# Patient Record
Sex: Male | Born: 1940 | Race: White | Hispanic: No | Marital: Married | Smoking: Never smoker
Health system: Southern US, Community
[De-identification: ages and names within clinical notes are randomized; demographics above are authoritative.]

## PROBLEM LIST (undated history)

## (undated) DIAGNOSIS — I314 Cardiac tamponade: Secondary | ICD-10-CM

## (undated) DIAGNOSIS — I4891 Unspecified atrial fibrillation: Secondary | ICD-10-CM

## (undated) DIAGNOSIS — I10 Essential (primary) hypertension: Secondary | ICD-10-CM

## (undated) DIAGNOSIS — I639 Cerebral infarction, unspecified: Secondary | ICD-10-CM

## (undated) DIAGNOSIS — I729 Aneurysm of unspecified site: Secondary | ICD-10-CM

## (undated) DIAGNOSIS — E119 Type 2 diabetes mellitus without complications: Secondary | ICD-10-CM

## (undated) HISTORY — DX: Cardiac tamponade: I31.4

## (undated) HISTORY — DX: Essential (primary) hypertension: I10

## (undated) HISTORY — DX: Type 2 diabetes mellitus without complications: E11.9

## (undated) HISTORY — DX: Unspecified atrial fibrillation: I48.91

## (undated) HISTORY — DX: Cerebral infarction, unspecified: I63.9

## (undated) HISTORY — PX: OTHER SURGICAL HISTORY: SHX169

---

## 2016-11-13 DIAGNOSIS — Z86010 Personal history of colonic polyps: Secondary | ICD-10-CM | POA: Insufficient documentation

## 2018-12-13 DIAGNOSIS — E785 Hyperlipidemia, unspecified: Secondary | ICD-10-CM | POA: Insufficient documentation

## 2018-12-13 DIAGNOSIS — I350 Nonrheumatic aortic (valve) stenosis: Secondary | ICD-10-CM | POA: Insufficient documentation

## 2018-12-13 DIAGNOSIS — I10 Essential (primary) hypertension: Secondary | ICD-10-CM | POA: Insufficient documentation

## 2018-12-13 DIAGNOSIS — R002 Palpitations: Secondary | ICD-10-CM | POA: Insufficient documentation

## 2018-12-13 DIAGNOSIS — E119 Type 2 diabetes mellitus without complications: Secondary | ICD-10-CM | POA: Insufficient documentation

## 2019-04-13 DIAGNOSIS — Z7189 Other specified counseling: Secondary | ICD-10-CM | POA: Insufficient documentation

## 2019-07-13 DIAGNOSIS — Z7901 Long term (current) use of anticoagulants: Secondary | ICD-10-CM | POA: Insufficient documentation

## 2019-07-13 DIAGNOSIS — I480 Paroxysmal atrial fibrillation: Secondary | ICD-10-CM | POA: Insufficient documentation

## 2020-07-23 DIAGNOSIS — H02831 Dermatochalasis of right upper eyelid: Secondary | ICD-10-CM | POA: Insufficient documentation

## 2020-07-23 DIAGNOSIS — H25813 Combined forms of age-related cataract, bilateral: Secondary | ICD-10-CM | POA: Insufficient documentation

## 2020-07-23 DIAGNOSIS — H43813 Vitreous degeneration, bilateral: Secondary | ICD-10-CM | POA: Insufficient documentation

## 2020-07-23 DIAGNOSIS — H524 Presbyopia: Secondary | ICD-10-CM | POA: Insufficient documentation

## 2020-07-23 DIAGNOSIS — H5213 Myopia, bilateral: Secondary | ICD-10-CM | POA: Insufficient documentation

## 2020-08-27 ENCOUNTER — Other Ambulatory Visit: Payer: Self-pay

## 2020-08-27 DIAGNOSIS — Z20822 Contact with and (suspected) exposure to covid-19: Secondary | ICD-10-CM

## 2020-08-29 LAB — NOVEL CORONAVIRUS, NAA: SARS-CoV-2, NAA: DETECTED — AB

## 2020-08-29 LAB — SARS-COV-2, NAA 2 DAY TAT

## 2020-08-30 ENCOUNTER — Telehealth: Payer: Self-pay | Admitting: Nurse Practitioner

## 2020-08-30 NOTE — Telephone Encounter (Signed)
Called to Discuss with patient about Covid symptoms and the use of the monoclonal antibody infusion for those with mild to moderate Covid symptoms and at a high risk of hospitalization.     Pt appears to qualify for this infusion due to co-morbid conditions and/or a member of an at-risk group in accordance with the FDA Emergency Use Authorization.   Patient would like to think about options. He has our contact information and will call if he decides to move forward.   Symptom onset: 08/26/20 Vaccinated: Yes, w/booster Qualified for Infusion: Yes, bmi, htn, cad  Alda Lea, NP [facility]  [phone]

## 2020-09-02 ENCOUNTER — Telehealth (HOSPITAL_COMMUNITY): Payer: Self-pay

## 2020-09-02 NOTE — Telephone Encounter (Signed)
Patient left a VM on hotline stating that he "took a turn for the worst" and is now interested in treatment. Called to Discuss with patient about Covid symptoms and the use of the monoclonal antibody infusion for those with mild to moderate Covid symptoms and at a high risk of hospitalization.     Pt no longer qualifies for this infusion, sx started on 1/24.    RN provided resources for [facility] as well as the [place] [facility] and [employer].

## 2020-09-04 ENCOUNTER — Ambulatory Visit (INDEPENDENT_AMBULATORY_CARE_PROVIDER_SITE_OTHER): Payer: Medicare HMO

## 2020-09-04 ENCOUNTER — Ambulatory Visit: Payer: Self-pay

## 2020-09-04 ENCOUNTER — Ambulatory Visit
Admission: EM | Admit: 2020-09-04 | Discharge: 2020-09-04 | Disposition: A | Discharge status: Home / Self Care | Payer: Medicare HMO | Attending: Emergency Medicine | Admitting: Emergency Medicine

## 2020-09-04 ENCOUNTER — Other Ambulatory Visit: Payer: Self-pay

## 2020-09-04 DIAGNOSIS — R059 Cough, unspecified: Secondary | ICD-10-CM | POA: Diagnosis not present

## 2020-09-04 DIAGNOSIS — U071 COVID-19: Secondary | ICD-10-CM | POA: Diagnosis not present

## 2020-09-04 DIAGNOSIS — R509 Fever, unspecified: Secondary | ICD-10-CM | POA: Diagnosis not present

## 2020-09-04 MED ORDER — BENZONATATE 200 MG PO CAPS: 200.0000 mg | ORAL_CAPSULE | Freq: Three times a day (TID) | ORAL | 0 refills | Status: DC | PRN

## 2020-09-04 NOTE — Telephone Encounter (Signed)
Patient called and says he still has a cough from having COVID on 08/27/20. He says it's a dry cough, but also some mucus comes up, especially in the mornings. He denies SOB. I asked him to check his temperature, it was 101. He says he still has no taste/smell, a little fatigue, no other symptoms. I advised to go to the UC, since no PCP, care advice given, patient verbalized understanding.  Reason for Disposition . [1] PERSISTING SYMPTOMS OF COVID-19 AND [2] symptoms WORSE  Answer Assessment - Initial Assessment Questions 1. COVID-19 ONSET: "When did the symptoms of COVID-19 first start?"     08/23/20 2. DIAGNOSIS CONFIRMATION: "How were you diagnosed?" (e.g., COVID-19 oral or nasal viral test; COVID-19 antibody test; doctor visit)     Nasal viral test 3. MAIN SYMPTOM:  "What is your main concern or symptom right now?" (e.g., breathing difficulty, cough, fatigue. loss of smell)     Cough 4. SYMPTOM ONSET: "When did the cough start?"     08/23/20 5. BETTER-SAME-WORSE: "Are you getting better, staying the same, or getting worse over the last 1 to 2 weeks?"     Same 6. RECENT MEDICAL VISIT: "Have you been seen by a healthcare provider (doctor, NP, PA) for these persisting COVID-19 symptoms?" If Yes, ask: "When were you seen?" (e.g., date)     No 7. COUGH: "Do you have a cough?" If Yes, ask: "How bad is the cough?"       Yes, bad coughing spell in the mornings 8. FEVER: "Do you have a fever?" If Yes, ask: "What is your temperature, how was it measured, and when did it start?"     Yes, 101 9. BREATHING DIFFICULTY: "Are you having any trouble breathing?" If Yes, ask: "How bad is your breathing?" (e.g., mild, moderate, severe)    - MILD: No SOB at rest, mild SOB with walking, speaks normally in sentences, can lie down, no retractions, pulse < 100.    - MODERATE: SOB at rest, SOB with minimal exertion and prefers to sit, cannot lie down flat, speaks in phrases, mild retractions, audible wheezing, pulse  100-120.    - SEVERE: Very SOB at rest, speaks in single words, struggling to breathe, sitting hunched forward, retractions, pulse > 120       No 10. HIGH RISK DISEASE: "Do you have any chronic medical problems?" (e.g., asthma, heart or lung disease, weak immune system, obesity, etc.)     Yes, treated for a-fib 11. VACCINE: "Have you gotten the COVID-19 vaccine?" If Yes ask: "Which one, how many shots, when did you get it?"       Yes all three 12. PREGNANCY: "Is there any chance you are pregnant?" "When was your last menstrual period?"       N/A 13. OTHER SYMPTOMS: "Do you have any other symptoms?"  (e.g., fatigue, headache, muscle pain, weakness)       A little fatigue, loss of taste and smell ongoing  Protocols used: CORONAVIRUS (COVID-19) PERSISTING SYMPTOMS FOLLOW-UP CALL-A-AH

## 2020-09-04 NOTE — Discharge Instructions (Addendum)
continue Tylenol, Mucinex DM, start Flonase.  start saline nasal irrigation with a NeilMed sinus rinse and distilled water as often as you want to prevent a bacterial sinus infection.  Tessalon will also help with the cough. follow-up with post-COVID care clinic.   Below is a list of primary care practices who are taking new patients for you to follow-up with.  [facility] internal medicine clinic Ground Floor - [facility], [place], [place], [place] [address] [phone]  [facility] Primary Care at [facility] [address] [place], [place] [address] [phone]  [place] [facility] [place], [place] [address] [phone]  [facility] [phone] [place] [address]  [facility]: [place]  [phone]  [place] [facility]: [place]   [phone]  [facility] Family Medicine: [address] [place]  [phone]  [place] primary care : [address] [place] [phone]  [facility] Primary Care: [address] [place] [address] [phone]  [facility]: [place] [phone]  Dr. Blanchie Serve [place]  [phone]  Dr. Benito Mccreedy, [facility] [place], [place] [address]  [phone]  Go to www.goodrx.com to look up your medications. This will give you a list of where you can find your prescriptions at the most affordable prices. Or ask the pharmacist what the cash price is, or if they have any other discount programs available to help make your medication more  affordable. This can be less expensive than what you would pay with insurance.

## 2020-09-04 NOTE — ED Triage Notes (Signed)
Patient states he was diagnosed with a positive covid test about 10 days ago. Pt states he still has a cough and congestion and wants to know if his fever is from covid. Pt is aox4 and ambulatory.

## 2020-09-04 NOTE — ED Provider Notes (Signed)
HPI  SUBJECTIVE:  Edward [place] is a 80 y.o. male who presents with persistent fevers, clear rhinorrhea, cough productive of mucus in the morning, but is dry during the rest of the day, chest and nasal congestion since being diagnosed with Covid on 1/25.  States that his symptoms started on 1/21.  States that he had a fever of 101 earlier today.  No shortness of breath, nausea, vomiting, diarrhea, sinus pain or pressure, body aches, headaches, postnasal drip, abdominal pain, ear pain.  No antipyretic in the past 6 hours.  He tried Mucinex DM with improvement in his symptoms.  No aggravating factors.  He has a past medical history of diabetes and atrial fibrillation on Eliquis.  No history of smoking, hypertension, pulmonary disease, liver disease.  UXN:ATFTDDU, No Pcp Per.  States that he recently moved here  History reviewed. No pertinent past medical history.  History reviewed. No pertinent surgical history.  History reviewed. No pertinent family history.  Social History   Tobacco Use  . Smoking status: Never Smoker  . Smokeless tobacco: Never Used  Vaping Use  . Vaping Use: Never used  Substance Use Topics  . Alcohol use: Not Currently  . Drug use: Not Currently    No current facility-administered medications for this encounter.  Current Outpatient Medications:  .  benzonatate (TESSALON) 200 MG capsule, Take 1 capsule (200 mg total) by mouth 3 (three) times daily as needed for cough., Disp: 30 capsule, Rfl: 0  No Known Allergies   ROS  As noted in HPI.   Physical Exam  BP 133/80 (BP Location: Right Arm)   Pulse 81   Temp 98.5 F (36.9 C) (Oral)   Resp 19   SpO2 97%   Constitutional: Well developed, well nourished, no acute distress Eyes:  EOMI, conjunctiva normal bilaterally HENT: Normocephalic, atraumatic,mucus membranes moist.  Positive nasal congestion.  No maxillary, frontal sinus tenderness Respiratory: Normal inspiratory effort, lungs clear bilaterally good  air movement Cardiovascular: Normal rate, regular rhythm no murmurs rubs or gallops GI: nondistended skin: No rash, skin intact Musculoskeletal: no deformities Neurologic: Alert & oriented x 3, no focal neuro deficits Psychiatric: Speech and behavior appropriate   ED Course   Medications - No data to display  Orders Placed This Encounter  Procedures  . DG Chest 2 View    Standing Status:   Standing    Number of Occurrences:   1    Order Specific Question:   Reason for Exam (SYMPTOM  OR DIAGNOSIS REQUIRED)    Answer:   cough fever covid + 10 days    No results found for this or any previous visit (from the past 24 hour(s)). DG Chest 2 View  Result Date: 09/04/2020 CLINICAL DATA:  Cough and fever.  COVID-19. EXAM: CHEST - 2 VIEW COMPARISON:  None. FINDINGS: The heart size and mediastinal contours are within normal limits. Both lungs are clear. The visualized skeletal structures are unremarkable. IMPRESSION: No active cardiopulmonary disease. Electronically Signed   By: Ulyses Jarred M.D.   On: 09/04/2020 21:11    ED Clinical Impression  1. COVID-19 virus infection      ED Assessment/Plan  X-raying chest to rule out secondary pneumonia given continued fevers.  Denies of sinusitis, otitis, meningitis, intra-abdominal process..  Reviewed imaging independently.  Normal chest x-ray. see radiology report for full details.  Chest x-ray negative for pneumonia. Suspect the continued fevers are from Covid infection.  We will have him continue Tylenol, Mucinex DM, start Flonase.  He is to start saline nasal irrigation.  We will provide a prescription for Tessalon.  Will have him follow-up with post-COVID care clinic.  will also provide primary care list and order a primary care referral for ongoing care as he states that he recently moved here.  Discussed imaging, MDM, treatment plan, and plan for follow-up with patient.patient agrees with plan.   Meds ordered this encounter   Medications  . benzonatate (TESSALON) 200 MG capsule    Sig: Take 1 capsule (200 mg total) by mouth 3 (three) times daily as needed for cough.    Dispense:  30 capsule    Refill:  0    *This clinic note was created using L[employer]. Therefore, there may be occasional mistakes despite careful proofreading.   ?    Melynda Ripple, MD 09/05/20 [phone]

## 2020-10-24 ENCOUNTER — Ambulatory Visit: Payer: Medicare HMO | Attending: Critical Care Medicine | Admitting: Critical Care Medicine

## 2020-10-24 ENCOUNTER — Other Ambulatory Visit: Payer: Self-pay

## 2020-10-24 ENCOUNTER — Encounter: Payer: Self-pay | Admitting: Critical Care Medicine

## 2020-10-24 VITALS — BP 140/80

## 2020-10-24 DIAGNOSIS — I350 Nonrheumatic aortic (valve) stenosis: Secondary | ICD-10-CM

## 2020-10-24 DIAGNOSIS — I10 Essential (primary) hypertension: Secondary | ICD-10-CM | POA: Diagnosis not present

## 2020-10-24 DIAGNOSIS — E119 Type 2 diabetes mellitus without complications: Secondary | ICD-10-CM

## 2020-10-24 DIAGNOSIS — E785 Hyperlipidemia, unspecified: Secondary | ICD-10-CM

## 2020-10-24 DIAGNOSIS — Z7901 Long term (current) use of anticoagulants: Secondary | ICD-10-CM

## 2020-10-24 DIAGNOSIS — Z7189 Other specified counseling: Secondary | ICD-10-CM

## 2020-10-24 DIAGNOSIS — I480 Paroxysmal atrial fibrillation: Secondary | ICD-10-CM

## 2020-10-24 NOTE — Assessment & Plan Note (Signed)
We will complete this when we see the patient face-to-face

## 2020-10-24 NOTE — Assessment & Plan Note (Signed)
Appears to be in chronic atrial fibrillation rate is controlled continue apixaban

## 2020-10-24 NOTE — Assessment & Plan Note (Signed)
Continue current dose of cholesterol medication

## 2020-10-24 NOTE — Assessment & Plan Note (Signed)
Stable at last imaging on statin therapy will need follow-up labs

## 2020-10-24 NOTE — Progress Notes (Signed)
Subjective:    Patient ID: Edward [place], male    DOB: [birth year], 80 y.o.   MRN: [id number] Virtual Visit via Telephone Note  I connected with Edward [place] on 10/24/20 at 10:30 AM EDT by telephone and verified that I am speaking with the correct person using two identifiers.   Consent:  I discussed the limitations, risks, security and privacy concerns of performing an evaluation and management service by telephone and the availability of in person appointments. I also discussed with the patient that there may be a patient responsible charge related to this service. The patient expressed understanding and agreed to proceed.  Location of patient: Patient's at home  Location of provider: I am in the office  Persons participating in the televisit with the patient.   No one else on the call  The patient has an iPhone with a 14.6 IOS system and cannot connect a video so this was converted to a telephone visit    History of Present Illness: 80 y.o.M est PCP  10/24/2020 This a very pleasant 80 year old male here to establish by way of referral from urgent care.  Patient formally lived in [place] and had a primary care physician [name] for 20 years in [place].  Patient now lives in [place] with his son and family.  He is now fully retired.  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  Patient now is fully retired.  We tried to have a video visit he does have an iPhone but he does not have the current updated operating system to interface with our video system.  We therefore converted to a telephone visit.  The patient states he has a history of paroxysmal atrial fibrillation and actually is now in chronic atrial fibrillation on chronic Eliquis.  Patient has aortic stenosis hypertension.  Also diabetes without long-term use of insulin on once daily 500 mg Metformin.  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  Patient has an ophthalmologist in the [facility] system he follows with.  Prior history of colon polyps GI states he does not need further colonoscopies at this time unless he is symptomatic.  His gastroenterologist is in [place] at [place].  Patient does have an active cardiologist in the [facility] system and is seeing him on a regular basis his records are available in care everywhere and I have reviewed them.  His last visit was in December and the patient was stable he has a follow-up visit in 6 months.  He also has a Paediatric nurse in [place] where he has skin cancers removed.  His spouse unfortunately has now Alzheimer's and is in [place] in a nursing home.  The patient has no real active complaints or symptoms this time is doing quite well.  He uses [facility] order and they keep him up-to-date on all of his medications and he has no needs for any refills at this time.  In reviewing the patient's primary care gaps the patient has received full vaccinations with a flu shot this year he has had a Pneumovax 23 valent.  He has not had a tetanus shot in many years.  The patient did receive his full COVID vaccine series including his booster.  The patient did have a foot exam 2 months ago.  For the diabetes the patient states his last A1c  was 7.4 several months ago.  I do not have access to the patient's recent labs just obtained 2 months ago at his primary care doctor's office.  When he comes to the office I will have him sign a release form so we get records from the [facility] primary care site  The patient does not smoke or drink alcohol or use other illicit drugs  The patient is pursuing an active lifestyle  History reviewed. No pertinent past medical history.   History reviewed. No pertinent family history.   Social History   Socioeconomic History  . Marital status: Unknown    Spouse name:  Not on file  . Number of children: Not on file  . Years of education: Not on file  . Highest education level: Not on file  Occupational History  . Not on file  Tobacco Use  . Smoking status: Never Smoker  . Smokeless tobacco: Never Used  Vaping Use  . Vaping Use: Never used  Substance and Sexual Activity  . Alcohol use: Not Currently  . Drug use: Not Currently  . Sexual activity: Not Currently  Other Topics Concern  . Not on file  Social History Narrative  . Not on file   Social Determinants of Health   Financial Resource Strain: Not on file  Food Insecurity: Not on file  Transportation Needs: Not on file  Physical Activity: Not on file  Stress: Not on file  Social Connections: Not on file  Intimate Partner Violence: Not on file     No Known Allergies   Outpatient Medications Prior to Visit  Medication Sig Dispense Refill  . apixaban (ELIQUIS) 5 MG TABS tablet Take by mouth.    Marland Kitchen aspirin 81 MG chewable tablet Chew by mouth.    . calcium carbonate (TUMS - DOSED IN MG ELEMENTAL CALCIUM) 500 MG chewable tablet Chew 1 tablet by mouth every other day.    . cholecalciferol (VITAMIN D3) 25 MCG (1000 UNIT) tablet Take 2,000 Units by mouth daily.    . DHA-EPA-Flaxseed Oil-Vitamin E (THERA TEARS NUTRITION PO) Take 3 drops by mouth daily.    . enalapril (VASOTEC) 5 MG tablet Take by mouth.    . finasteride (PROSCAR) 5 MG tablet Take by mouth.    . fluticasone (FLONASE) 50 MCG/ACT nasal spray Place 2 sprays into both nostrils daily.    Marland Kitchen levothyroxine (SYNTHROID) 25 MCG tablet Take by mouth.    . magnesium oxide (MAG-OX) 400 MG tablet Take by mouth daily.    . metFORMIN (GLUCOPHAGE-XR) 500 MG 24 hr tablet Take by mouth.    . saccharomyces boulardii (FLORASTOR) 250 MG capsule Take by mouth.    . simvastatin (ZOCOR) 10 MG tablet Take by mouth.    . verapamil (CALAN-SR) 240 MG CR tablet Take by mouth.    . calcium-vitamin D (OSCAL 500/200 D-3) 500-200 MG-UNIT tablet Take by mouth.     . benzonatate (TESSALON) 200 MG capsule Take 1 capsule (200 mg total) by mouth 3 (three) times daily as needed for cough. (Patient not taking: Reported on 10/24/2020) 30 capsule 0  . clobetasol (TEMOVATE) 0.05 % external solution Apply topically. (Patient not taking: Reported on 10/24/2020)    . ketoconazole (NIZORAL) 2 % shampoo Apply topically. (Patient not taking: Reported on 10/24/2020)     No facility-administered medications prior to visit.     Review of Systems  Constitutional: Negative.   HENT: Negative.   Eyes: Positive for visual disturbance.  Respiratory: Negative.   Cardiovascular:  Negative.   Gastrointestinal: Negative.   Endocrine: Negative.   Genitourinary: Negative.   Musculoskeletal: Negative.   Skin: Negative.   Allergic/Immunologic: Negative.   Neurological: Negative.   Hematological: Negative.   Psychiatric/Behavioral: Negative.        Objective:   Physical Exam No exam this is a phone note       Assessment & Plan:  I personally reviewed all images and lab data in the [facility] system as well as any outside material available during this office visit and agree with the  radiology impressions.   Aortic stenosis, moderate Stable at last imaging on statin therapy will need follow-up labs  Benign essential HTN Most recent blood pressure was stable  No change in medications at this time  PAF (paroxysmal atrial fibrillation) (HCC) Appears to be in chronic atrial fibrillation rate is controlled continue apixaban  Type 2 diabetes mellitus without complication, without long-term current use of insulin (HCC) Last A1c 7.4  Patient is on Metformin once daily  We will need to make adjustments we will wait till he comes into the office for direct exam  Chronic anticoagulation No evidence of active bleeding continue apixaban  Dyslipidemia Continue current dose of cholesterol medication  Encounter for anticoagulation discussion and counseling We will complete  this when we see the patient face-to-face   Diagnoses and all orders for this visit:  Aortic stenosis, moderate  Benign essential HTN  PAF (paroxysmal atrial fibrillation) ([place])  Type 2 diabetes mellitus without complication, without long-term current use of insulin (HCC)  Chronic anticoagulation  Dyslipidemia  Encounter for anticoagulation discussion and counseling     Follow Up Instructions: Patient knows a direct office exam will be scheduled in April   I discussed the assessment and treatment plan with the patient. The patient was provided an opportunity to ask questions and all were answered. The patient agreed with the plan and demonstrated an understanding of the instructions.   The patient was advised to call back or seek an in-person evaluation if the symptoms worsen or if the condition fails to improve as anticipated.  I provided 30 minutes of non-face-to-face time during this encounter  including  median intraservice time , review of notes, labs, imaging, medications  and explaining diagnosis and management to the patient .    Asencion [place], MD

## 2020-10-24 NOTE — Assessment & Plan Note (Addendum)
Most recent blood pressure was stable  No change in medications at this time

## 2020-10-24 NOTE — Assessment & Plan Note (Signed)
No evidence of active bleeding continue apixaban

## 2020-10-24 NOTE — Assessment & Plan Note (Signed)
Last A1c 7.4  Patient is on Metformin once daily  We will need to make adjustments we will wait till he comes into the office for direct exam

## 2020-11-01 ENCOUNTER — Encounter (INDEPENDENT_AMBULATORY_CARE_PROVIDER_SITE_OTHER): Payer: Self-pay

## 2020-12-05 ENCOUNTER — Other Ambulatory Visit: Payer: Self-pay

## 2020-12-05 ENCOUNTER — Ambulatory Visit: Payer: Medicare HMO | Attending: Critical Care Medicine | Admitting: Critical Care Medicine

## 2020-12-05 ENCOUNTER — Encounter: Payer: Self-pay | Admitting: Critical Care Medicine

## 2020-12-05 VITALS — BP 125/76 | HR 78 | Resp 18 | Ht 66.0 in | Wt 172.4 lb

## 2020-12-05 DIAGNOSIS — I10 Essential (primary) hypertension: Secondary | ICD-10-CM

## 2020-12-05 DIAGNOSIS — Z7901 Long term (current) use of anticoagulants: Secondary | ICD-10-CM

## 2020-12-05 DIAGNOSIS — E039 Hypothyroidism, unspecified: Secondary | ICD-10-CM | POA: Insufficient documentation

## 2020-12-05 DIAGNOSIS — H259 Unspecified age-related cataract: Secondary | ICD-10-CM | POA: Diagnosis not present

## 2020-12-05 DIAGNOSIS — N401 Enlarged prostate with lower urinary tract symptoms: Secondary | ICD-10-CM

## 2020-12-05 DIAGNOSIS — R350 Frequency of micturition: Secondary | ICD-10-CM

## 2020-12-05 DIAGNOSIS — H43813 Vitreous degeneration, bilateral: Secondary | ICD-10-CM

## 2020-12-05 DIAGNOSIS — Z9989 Dependence on other enabling machines and devices: Secondary | ICD-10-CM

## 2020-12-05 DIAGNOSIS — Z86010 Personal history of colonic polyps: Secondary | ICD-10-CM

## 2020-12-05 DIAGNOSIS — G4733 Obstructive sleep apnea (adult) (pediatric): Secondary | ICD-10-CM | POA: Insufficient documentation

## 2020-12-05 DIAGNOSIS — I480 Paroxysmal atrial fibrillation: Secondary | ICD-10-CM

## 2020-12-05 DIAGNOSIS — E785 Hyperlipidemia, unspecified: Secondary | ICD-10-CM

## 2020-12-05 DIAGNOSIS — E119 Type 2 diabetes mellitus without complications: Secondary | ICD-10-CM

## 2020-12-05 DIAGNOSIS — I350 Nonrheumatic aortic (valve) stenosis: Secondary | ICD-10-CM

## 2020-12-05 DIAGNOSIS — H903 Sensorineural hearing loss, bilateral: Secondary | ICD-10-CM

## 2020-12-05 DIAGNOSIS — N400 Enlarged prostate without lower urinary tract symptoms: Secondary | ICD-10-CM | POA: Insufficient documentation

## 2020-12-05 DIAGNOSIS — Z7189 Other specified counseling: Secondary | ICD-10-CM

## 2020-12-05 LAB — POCT GLYCOSYLATED HEMOGLOBIN (HGB A1C): HbA1c, POC (controlled diabetic range): 7.7 % — AB (ref 0.0–7.0)

## 2020-12-05 LAB — GLUCOSE, POCT (MANUAL RESULT ENTRY): POC Glucose: 360 mg/dl — AB (ref 70–99)

## 2020-12-05 NOTE — Assessment & Plan Note (Signed)
Check CBC 

## 2020-12-05 NOTE — Assessment & Plan Note (Signed)
Continue current statin therapy follow-up lipid levels and liver function

## 2020-12-05 NOTE — Assessment & Plan Note (Signed)
The patient indicated to me that no further colonoscopies are indicated but the note from gastroenterology suggest another surveillance exam should occur in April 2023 we will need to follow-up at that point and determine

## 2020-12-05 NOTE — Assessment & Plan Note (Signed)
Check thyroid levels continue Synthroid 25 mcg daily

## 2020-12-05 NOTE — Assessment & Plan Note (Signed)
Only on once daily metformin and A1c is crept up to 7.7 blood glucose greater than 300, I suspect with the patient's change in location living with his daughter now leaving his home in [place] and having to travel to see his wife twice a week at a nursing home in [place] there is a great deal of stress with this I suspect he is not following his diet or exercise program properly  Plan to keep metformin at once a day and will have further discussions with him about the possibility of increasing this dose and perhaps adding a drug such as Trulicity

## 2020-12-05 NOTE — Assessment & Plan Note (Signed)
History of BPH well controlled with Proscar however will check PSA

## 2020-12-05 NOTE — Assessment & Plan Note (Signed)
Follows with cardiology twice a year being monitored no symptoms

## 2020-12-05 NOTE — Assessment & Plan Note (Signed)
Benign essential hypertension stable at this time blood pressure good at 125/76 no changes made

## 2020-12-05 NOTE — Assessment & Plan Note (Signed)
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 Gillian Scarce for further assessments

## 2020-12-05 NOTE — Assessment & Plan Note (Signed)
History of chronic atrial fibrillation today appears to be in a regular rhythm Note the verapamil appears to be controlling the patient's rate well  Patient on apixaban twice daily no evidence of bleeding  Patient has established follow-up with cardiology

## 2020-12-05 NOTE — Progress Notes (Signed)
Subjective:    Patient ID: Edward Butler, male    DOB: Jul 07, 1941, 80 y.o.   MRN: [id number]  History of Present Illness: 80 y.o.M est PCP  10/24/2020 This a very pleasant 80 year old male here to establish by way of referral from urgent care.  Patient formally lived in [place] and had a primary care physician [name] for 20 years in [place].  Patient now lives in [place] with his son and family.  He is now fully retired.  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  Patient now is fully retired.  We tried to have a video visit he does have an iPhone but he does not have the current updated operating system to interface with our video system.  We therefore converted to a telephone visit.  The patient states he has a history of paroxysmal atrial fibrillation and actually is now in chronic atrial fibrillation on chronic Eliquis.  Patient has aortic stenosis hypertension.  Also diabetes without long-term use of insulin on once daily 500 mg Metformin.  Patient also has senile cataracts and also partial vitreous detachment of both eyes.  Patient has an ophthalmologist in the [facility] system he follows with.  Prior history of colon polyps GI states he does not need further colonoscopies at this time unless he is symptomatic.  His gastroenterologist is in [place] at [place].  Patient does have an active cardiologist in the [facility] system and is seeing him on a regular basis his records are available in care everywhere and I have reviewed them.  His last visit was in December and the patient was stable he has a follow-up visit in 6 months.  He also has a Paediatric nurse in [place] where he has skin cancers removed.  His spouse unfortunately has now Alzheimer's and is in [place] in a nursing home.  The patient has no real active complaints or symptoms this time  is doing quite well.  He uses [facility] order and they keep him up-to-date on all of his medications and he has no needs for any refills at this time.  In reviewing the patient's primary care gaps the patient has received full vaccinations with a flu shot this year he has had a Pneumovax 23 valent.  He has not had a tetanus shot in many years.  The patient did receive his full COVID vaccine series including his booster.  The patient did have a foot exam 2 months ago.  For the diabetes the patient states his last A1c was 7.4 several months ago.  I do not have access to the patient's recent labs just obtained 2 months ago at his primary care doctor's office.  When he comes to the office I will have him sign a release form so we get records from the [place] primary care site  The patient does not smoke or drink alcohol or use other illicit drugs  The patient is pursuing an active lifestyle  12/05/2020 This is a pleasant 80 year old male here to establish for primary care with a face-to-face visit.  The last visit in March was over the phone.  Patient does have history of hypertension, type 2 diabetes, aortic stenosis moderate, history of chronic atrial fibrillation, chronic anticoagulation, dyslipidemia, bilateral cataracts, hypothyroidism, decreased sensorineural hearing, history of colon polyps no longer getting routine colonoscopy due to age, posterior vitreous detachment both eyes  in the past, myopia with astigmatism.  Overall the patient's been doing well he is maintaining metformin once daily his last A1c was 7.4 with his primary care provider however on arrival today his blood glucose is greater than 300 and his A1c is 7.7 which is an increase The patient has moved in with his daughter and son-in-law in [place] his wife still has Alzheimer's and is in a nursing home in [place] which she visits twice a week  Patient's exercise program is not quite fully engaged and his diet has  varied  The patient's biggest complaint is he would like to establish with a new ophthalmologist in the [place] area he does have an established cardiologist that he sees twice a year in [place] we have access to those records also note this patient has sleep apnea and is on a CPAP machine for the past 10 years this is prescribed by Dr. In [place].  Patient denies any blood in the stool on the anticoagulant no other significant bleeding.  He does see a dermatologist named Dr. Frederico Hamman in dermatology in [place] with [facility] who takes care of his skin conditions  Patient does have prostatism and needs a PSA check he is on Proscar  I did a complete medication reconciliation and the med list is now accurate  We do not yet have records from the patient's primary care provider in [place] we will make this request  Patient does use [facility] pharmacy service and wants refill sent to that service if requested in the future  Past Medical History:  Diagnosis Date  . Atrial fibrillation ([place])   . Diabetes mellitus without complication ([place])   . Hypertension   . Stroke [facility])      No family history on file.   Social History   Socioeconomic History  . Marital status: Unknown    Spouse name: Not on file  . Number of children: Not on file  . Years of education: Not on file  . Highest education level: Not on file  Occupational History  . Not on file  Tobacco Use  . Smoking status: Never Smoker  . Smokeless tobacco: Never Used  Vaping Use  . Vaping Use: Never used  Substance and Sexual Activity  . Alcohol use: Not Currently  . Drug use: Not Currently  . Sexual activity: Not Currently  Other Topics Concern  . Not on file  Social History Narrative  . Not on file   Social Determinants of Health   Financial Resource Strain: Not on file  Food Insecurity: Not on file  Transportation Needs: Not on file  Physical Activity: Not on file  Stress: Not on file   Social Connections: Not on file  Intimate Partner Violence: Not on file     No Known Allergies   Outpatient Medications Prior to Visit  Medication Sig Dispense Refill  . apixaban (ELIQUIS) 5 MG TABS tablet Take 5 mg by mouth 2 (two) times daily.    . cholecalciferol (VITAMIN D3) 25 MCG (1000 UNIT) tablet Take 2,000 Units by mouth daily.    . DHA-EPA-Flaxseed Oil-Vitamin E (THERA TEARS NUTRITION PO) Take 3 drops by mouth daily.    . enalapril (VASOTEC) 5 MG tablet Take 5 mg by mouth daily.    . finasteride (PROSCAR) 5 MG tablet Take 5 mg by mouth daily.    . fluticasone (FLONASE) 50 MCG/ACT nasal spray Place 2 sprays into both nostrils daily as needed.    Marland Kitchen levothyroxine (SYNTHROID)  25 MCG tablet Take 25 mcg by mouth daily before breakfast.    . magnesium oxide (MAG-OX) 400 MG tablet Take by mouth daily.    . metFORMIN (GLUCOPHAGE-XR) 500 MG 24 hr tablet Take 500 mg by mouth daily with breakfast.    . simvastatin (ZOCOR) 10 MG tablet Take 10 mg by mouth daily.    . verapamil (CALAN-SR) 240 MG CR tablet Take 240 mg by mouth daily.    Marland Kitchen aspirin 81 MG chewable tablet Chew by mouth. (Patient not taking: Reported on 12/05/2020)    . calcium carbonate (TUMS - DOSED IN MG ELEMENTAL CALCIUM) 500 MG chewable tablet Chew 1 tablet by mouth every other day. (Patient not taking: Reported on 12/05/2020)    . saccharomyces boulardii (FLORASTOR) 250 MG capsule Take by mouth. (Patient not taking: Reported on 12/05/2020)     No facility-administered medications prior to visit.     Review of Systems  Constitutional: Negative.   HENT: Negative.   Eyes: Positive for visual disturbance.  Respiratory: Negative.   Cardiovascular: Negative.   Gastrointestinal: Negative.   Endocrine: Negative.   Genitourinary: Negative.   Musculoskeletal: Negative.   Skin: Negative.   Allergic/Immunologic: Negative.   Neurological: Negative.   Hematological: Negative.   Psychiatric/Behavioral: Negative.         Objective:   Physical Exam . Vitals:   12/05/20 0916 12/05/20 1005  BP: (!) 144/81 125/76  Pulse: 78   Resp: 18   SpO2: 95%   Weight: 172 lb 6.4 oz (78.2 kg)   Height: 5\' 6"  (1.676 m)     Gen: Pleasant, well-nourished, in no distress,  normal affect  ENT: No lesions,  mouth clear,  oropharynx clear, no postnasal drip  Neck: No JVD, no TMG, no carotid bruits  Lungs: No use of accessory muscles, no dullness to percussion, clear without rales or rhonchi  Cardiovascular: RRR, heart sounds systolic ejection murmur heard in the aortic position , no gallops, no peripheral edema  Abdomen: protuberant, soft and NT, no HSM,  BS normal  Musculoskeletal: No deformities, no cyanosis or clubbing  Neuro: alert, non focal  Skin: Multiple areas of previous evidence of skin cancers on forehead and face that have been treated       Assessment & Plan:  I personally reviewed all images and lab data in the [facility] system as well as any outside material available during this office visit and agree with the  radiology impressions.   PAF (paroxysmal atrial fibrillation) (HCC) History of chronic atrial fibrillation today appears to be in a regular rhythm Note the verapamil appears to be controlling the patient's rate well  Patient on apixaban twice daily no evidence of bleeding  Patient has established follow-up with cardiology  Benign essential HTN Benign essential hypertension stable at this time blood pressure good at 125/76 no changes made  Aortic stenosis, moderate Follows with cardiology twice a year being monitored no symptoms  Type 2 diabetes mellitus without complication, without long-term current use of insulin ([place]) Only on once daily metformin and A1c is crept up to 7.7 blood glucose greater than 300, I suspect with the patient's change in location living with his daughter now leaving his home in [place] and having to travel to see his wife twice a week at a nursing home in  [place] there is a great deal of stress with this I suspect he is not following his diet or exercise program properly  Plan to keep metformin at once a day  and will have further discussions with him about the possibility of increasing this dose and perhaps adding a drug such as Trulicity  Posterior vitreous detachment of both eyes Given history of cataracts in both eyes prior vitreous detachments both eyes and severe myopia and given patient request we will refer him to [facility] ophthalmology with Dr. Gillian Scarce for further assessments  History of colonic polyps The patient indicated to me that no further colonoscopies are indicated but the note from gastroenterology suggest another surveillance exam should occur in April 2023 we will need to follow-up at that point and determine  Dyslipidemia Continue current statin therapy follow-up lipid levels and liver function  Chronic anticoagulation Check CBC  OSA on CPAP Patient's been on CPAP machine with nasal mask for 10 years the machine is working well patient does not know the settings he does not recall who prescribed this to him  He will continue using his CPAP for now  BPH (benign prostatic hyperplasia) History of BPH well controlled with Proscar however will check PSA  Hypothyroidism Check thyroid levels continue Synthroid 25 mcg daily  SNHL (sensory-neural hearing loss), 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  Diagnoses and all orders for this visit:  Type 2 diabetes mellitus without complication, without long-term current use of insulin (HCC) -     POCT glucose (manual entry) -     POCT glycosylated hemoglobin (Hb A1C) -     Comprehensive metabolic panel -     Hemoglobin A1c  Age-related cataract of both eyes, unspecified age-related cataract type -     Ambulatory referral to Ophthalmology  Benign essential HTN  Encounter for  anticoagulation discussion and counseling -     CBC with Differential/Platelet  Hypothyroidism, unspecified type -     Thyroid Panel With TSH  Dyslipidemia -     Lipid panel  Benign prostatic hyperplasia with urinary frequency -     PSA  PAF (paroxysmal atrial fibrillation) (HCC)  Aortic stenosis, moderate  Posterior vitreous detachment of both eyes  History of colonic polyps  Chronic anticoagulation  OSA on CPAP  Benign prostatic hyperplasia without lower urinary tract symptoms  SNHL (sensory-neural hearing loss), asymmetrical   Complete set of screening labs were sent including PSA level with patient's history of BPH

## 2020-12-05 NOTE — Assessment & Plan Note (Signed)
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

## 2020-12-05 NOTE — Assessment & Plan Note (Addendum)
Patient's been on CPAP machine with nasal mask for 10 years the machine is working well patient does not know the settings he does not recall who prescribed this to him  He will continue using his CPAP for now

## 2020-12-05 NOTE — Patient Instructions (Signed)
Referral to Dr. Josephina Shih will be made for your cataracts  Will await notice from [facility] for any medication refills, there were no medication changes today  Labs today include metabolic panel blood count lipid panel thyroid function and PSA which is a prostate cancer screening  We will get you to sign a record release so we can obtain records from your primary care physician  Please continue to follow-up with your cardiologist and your dermatologist you already have in the [facility] area  Return Dr. Joya Gaskins 4 months

## 2020-12-06 ENCOUNTER — Telehealth: Payer: Self-pay

## 2020-12-06 ENCOUNTER — Other Ambulatory Visit: Payer: Self-pay | Admitting: Critical Care Medicine

## 2020-12-06 LAB — COMPREHENSIVE METABOLIC PANEL
ALT: 14 IU/L (ref 0–44)
AST: 13 IU/L (ref 0–40)
Albumin/Globulin Ratio: 1.7 (ref 1.2–2.2)
Albumin: 4.2 g/dL (ref 3.6–4.6)
Alkaline Phosphatase: 64 IU/L (ref 44–121)
BUN/Creatinine Ratio: 15 (ref 10–24)
BUN: 17 mg/dL (ref 8–27)
Bilirubin Total: 0.7 mg/dL (ref 0.0–1.2)
CO2: 21 mmol/L (ref 20–29)
Calcium: 9.6 mg/dL (ref 8.6–10.2)
Chloride: 101 mmol/L (ref 96–106)
Creatinine, Ser: 1.1 mg/dL (ref 0.76–1.27)
Globulin, Total: 2.5 g/dL (ref 1.5–4.5)
Glucose: 313 mg/dL — ABNORMAL HIGH (ref 65–99)
Potassium: 4.2 mmol/L (ref 3.5–5.2)
Sodium: 138 mmol/L (ref 134–144)
Total Protein: 6.7 g/dL (ref 6.0–8.5)
eGFR: 67 mL/min/{1.73_m2} (ref >59)

## 2020-12-06 LAB — HEMOGLOBIN A1C
Est. average glucose Bld gHb Est-mCnc: 177 mg/dL
Hgb A1c MFr Bld: 7.8 % — ABNORMAL HIGH (ref 4.8–5.6)

## 2020-12-06 LAB — PSA: Prostate Specific Ag, Serum: 0.4 ng/mL (ref 0.0–4.0)

## 2020-12-06 LAB — CBC WITH DIFFERENTIAL/PLATELET
Basophils Absolute: 0 10*3/uL (ref 0.0–0.2)
Basos: 1 %
EOS (ABSOLUTE): 0.3 10*3/uL (ref 0.0–0.4)
Eos: 4 %
Hematocrit: 48.7 % (ref 37.5–51.0)
Hemoglobin: 17 g/dL (ref 13.0–17.7)
Immature Grans (Abs): 0 10*3/uL (ref 0.0–0.1)
Immature Granulocytes: 1 %
Lymphocytes Absolute: 1.9 10*3/uL (ref 0.7–3.1)
Lymphs: 28 %
MCH: 31.7 pg (ref 26.6–33.0)
MCHC: 34.9 g/dL (ref 31.5–35.7)
MCV: 91 fL (ref 79–97)
Monocytes Absolute: 0.5 10*3/uL (ref 0.1–0.9)
Monocytes: 7 %
Neutrophils Absolute: 4 10*3/uL (ref 1.4–7.0)
Neutrophils: 59 %
Platelets: 176 10*3/uL (ref 150–450)
RBC: 5.37 x10E6/uL (ref 4.14–5.80)
RDW: 13 % (ref 11.6–15.4)
WBC: 6.7 10*3/uL (ref 3.4–10.8)

## 2020-12-06 LAB — LIPID PANEL
Chol/HDL Ratio: 3.3 ratio (ref 0.0–5.0)
Cholesterol, Total: 141 mg/dL (ref 100–199)
HDL: 43 mg/dL (ref >39)
LDL Chol Calc (NIH): 69 mg/dL (ref 0–99)
Triglycerides: 171 mg/dL — ABNORMAL HIGH (ref 0–149)
VLDL Cholesterol Cal: 29 mg/dL (ref 5–40)

## 2020-12-06 LAB — THYROID PANEL WITH TSH
Free Thyroxine Index: 2.1 (ref 1.2–4.9)
T3 Uptake Ratio: 28 % (ref 24–39)
T4, Total: 7.6 ug/dL (ref 4.5–12.0)
TSH: 2.8 u[IU]/mL (ref 0.450–4.500)

## 2020-12-06 MED ORDER — METFORMIN HCL ER 500 MG PO TB24: 1000.0000 mg | ORAL_TABLET | Freq: Every day | ORAL | 3 refills | Status: DC

## 2020-12-06 NOTE — Telephone Encounter (Signed)
Pt was called and informed of appointments.

## 2020-12-23 ENCOUNTER — Other Ambulatory Visit: Payer: Self-pay

## 2020-12-23 ENCOUNTER — Ambulatory Visit: Payer: Medicare HMO | Attending: Critical Care Medicine | Admitting: Pharmacist

## 2020-12-23 DIAGNOSIS — E119 Type 2 diabetes mellitus without complications: Secondary | ICD-10-CM

## 2020-12-23 LAB — GLUCOSE, POCT (MANUAL RESULT ENTRY): POC Glucose: 240 mg/dl — AB (ref 70–99)

## 2020-12-23 MED ORDER — FREESTYLE LIBRE 14 DAY READER DEVI: 6 refills | Status: DC

## 2020-12-23 MED ORDER — FREESTYLE LIBRE 14 DAY SENSOR MISC: 6 refills | Status: DC

## 2020-12-23 MED ORDER — METFORMIN HCL ER 500 MG PO TB24: 2000.0000 mg | ORAL_TABLET | Freq: Every day | ORAL | 1 refills | Status: DC

## 2020-12-23 NOTE — Progress Notes (Signed)
    S:     No chief complaint on file.  Patient arrives in good spirits.  Presents for diabetes evaluation, education, and management Patient was referred and last seen by Primary Care Provider on 12/05/2020. A1c at that visit was 7.8%.   Patient reports Diabetes is longstanding. He has not required insulin and is only taking metformin. Denies hx of pancreatitis. No personal or fhx of thyroid cancer. He does have a hx of a fib on chornic anticoag with Eliquis.  Has a hx of stroke listed. No CH, CKD, or ACS/CAD history.   Family/Social History:  -FHx: no pertinent positives  -Tobacco: never smoker -Alcohol: none currently   Insurance coverage/medication affordability: [employer]  Medication adherence reported.   Current diabetes medications include: metformin 500 mg XR - takes 2 tablets (1000mg  total) once daily Current hypertension medications include: verapamil 240 mg CR, enalapril 5 mg daily  Current hyperlipidemia medications include: simvastatin 10 mg daily   Patient denies hypoglycemic events.  Patient reported dietary habits:  -Tries to utilize a diabetic diet  -Reports that since his visit with Dr. Joya Gaskins, he has cut out sweets, sodas, bread, etc.   Patient-reported exercise habits:  -He is now walking ~20 minutes daily   Patient denies nocturia (nighttime urination).  Patient denies neuropathy (nerve pain). Patient denies visual changes. Patient reports self foot exams.     O:  POCT: 240   Lab Results  Component Value Date   HGBA1C 7.8 (H) 12/05/2020   There were no vitals filed for this visit.  Lipid Panel     Component Value Date/Time   CHOL 141 12/05/2020 1012   TRIG 171 (H) 12/05/2020 1012   HDL 43 12/05/2020 1012   CHOLHDL 3.3 12/05/2020 1012   LDLCALC 69 12/05/2020 1012   Home fasting blood sugars: does not have a meter. Requesting a [employer].   Clinical Atherosclerotic Cardiovascular Disease (ASCVD): Yes  The ASCVD Risk score Mikey Bussing  DC Jr., et al., 2013) failed to calculate for the following reasons:   The 2013 ASCVD risk score is only valid for ages 52 to 59    A/P: Diabetes longstanding currently above goal. Given his degree of functionality, age and comorbid conditions, I recommend an A1c goal <7.5%. Patient is able to verbalize appropriate hypoglycemia management plan. Medication adherence appears appropriate. Will optimize metformin.  -Increased dose of metformin to 2000mg  daily. Pt advised to increase to two 500mg  XR tablets BID.  -Will hold off on adding additional therapy at this time.  -Extensively discussed pathophysiology of diabetes, recommended lifestyle interventions, dietary effects on blood sugar control -Counseled on s/sx of and management of hypoglycemia -Next A1C anticipated 03/2021.   Written patient instructions provided.  Total time in face to face counseling 30 minutes.   Follow up Pharmacist Clinic Visit in 1 month.    Benard Halsted, PharmD, [place], [place] [phone]

## 2020-12-26 ENCOUNTER — Other Ambulatory Visit: Payer: Self-pay

## 2020-12-26 ENCOUNTER — Ambulatory Visit
Admission: EM | Admit: 2020-12-26 | Discharge: 2020-12-26 | Disposition: A | Discharge status: Home / Self Care | Payer: Medicare HMO | Attending: Family Medicine | Admitting: Family Medicine

## 2020-12-26 DIAGNOSIS — R1011 Right upper quadrant pain: Secondary | ICD-10-CM

## 2020-12-26 LAB — POCT URINALYSIS DIP (MANUAL ENTRY)
Bilirubin, UA: NEGATIVE
Glucose, UA: NEGATIVE mg/dL
Ketones, POC UA: NEGATIVE mg/dL
Leukocytes, UA: NEGATIVE
Nitrite, UA: NEGATIVE
Protein Ur, POC: NEGATIVE mg/dL
Spec Grav, UA: 1.025 (ref 1.010–1.025)
Urobilinogen, UA: 0.2 E.U./dL
pH, UA: 5.5 (ref 5.0–8.0)

## 2020-12-26 NOTE — Discharge Instructions (Signed)
Take Tylenol as needed for the pain

## 2020-12-26 NOTE — ED Triage Notes (Signed)
Pt c/o rt side pain x3wks. States pain is only on movement. Denies urinary sx's. States hx of kidney stones years ago but this feels different. States prior to this he trimmed bushes with a heavy trimmer and used a chain saw.

## 2020-12-26 NOTE — ED Provider Notes (Signed)
[facility]    CSN: [id number] Arrival date & time: 12/26/20  1012      History   Chief Complaint Chief Complaint  Patient presents with  . Flank Pain    HPI Edward Butler is a 80 y.o. male.   Patient presenting today with 3-week history of mild intermittent right flank/right upper quadrant abdominal pain.  He states symptoms started shortly after he was mowing and trimming brush in his yard.  The pain only hurts when he moves certain ways and is relieved by rest easily.  Denies swelling, bruising, abdominal pain in other areas, nausea, vomiting, bowel changes, fever, chills, melena.  His main concern today is cancer as his dad died of liver cancer.  He has not tried anything over-the-counter for symptoms thus far.     Past Medical History:  Diagnosis Date  . Atrial fibrillation ([place])   . Diabetes mellitus without complication ([place])   . Hypertension   . Stroke [facility])     Patient Active Problem List   Diagnosis Date Noted  . OSA on CPAP 12/05/2020  . BPH (benign prostatic hyperplasia) 12/05/2020  . Hypothyroidism 12/05/2020  . SNHL (sensory-neural hearing loss), asymmetrical 12/05/2020  . 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 Dermatochalasis of both upper eyelids 07/23/2020  . Myopia with astigmatism and presbyopia, bilateral 07/23/2020  . Posterior vitreous detachment of both eyes 07/23/2020  . Chronic anticoagulation 07/13/2019  . PAF (paroxysmal atrial fibrillation) ([place]) 07/13/2019  . Encounter for anticoagulation discussion and counseling 04/13/2019  . Aortic stenosis, moderate 12/13/2018  . Benign essential HTN 12/13/2018  . Dyslipidemia 12/13/2018  . Type 2 diabetes mellitus without complication, without long-term current use of insulin ([place]) 12/13/2018  . History of colonic polyps 11/13/2016    Past Surgical History:  Procedure Laterality Date  . skin cancer surgeries          Home Medications    Prior to Admission  medications   Medication Sig Start Date End Date Taking? Authorizing Provider  apixaban (ELIQUIS) 5 MG TABS tablet Take 5 mg by mouth 2 (two) times daily. 07/13/19   [provider]  cholecalciferol (VITAMIN D3) 25 MCG (1000 UNIT) tablet Take 2,000 Units by mouth daily.    [provider]  Continuous Blood Gluc Receiver (FREESTYLE LIBRE 14 DAY READER) DEVI Use to check blood sugar TID. 12/23/20   Elsie Stain, MD  Continuous Blood Gluc Sensor (FREESTYLE LIBRE 14 DAY SENSOR) MISC Use to check blood sugar TID. 12/23/20   Elsie Stain, MD  DHA-EPA-Flaxseed Oil-Vitamin E (THERA TEARS NUTRITION PO) Take 3 drops by mouth daily.    [provider]  enalapril (VASOTEC) 5 MG tablet Take 5 mg by mouth daily. 09/28/18   [provider]  finasteride (PROSCAR) 5 MG tablet Take 5 mg by mouth daily. 12/03/18   [provider]  fluticasone (FLONASE) 50 MCG/ACT nasal spray Place 2 sprays into both nostrils daily as needed.    [provider]  levothyroxine (SYNTHROID) 25 MCG tablet Take 25 mcg by mouth daily before breakfast. 12/03/18   [provider]  magnesium oxide (MAG-OX) 400 MG tablet Take by mouth daily. 12/13/18   [provider]  metFORMIN (GLUCOPHAGE-XR) 500 MG 24 hr tablet Take 4 tablets (2,000 mg total) by mouth daily. 12/23/20 03/23/21  Elsie Stain, MD  simvastatin (ZOCOR) 10 MG tablet Take 10 mg by mouth daily. 09/28/18   [provider]  verapamil (CALAN-SR)  240 MG CR tablet Take 240 mg by mouth daily. 12/11/18   [provider]    Family History History reviewed. No pertinent family history.  Social History Social History   Tobacco Use  . Smoking status: Never Smoker  . Smokeless tobacco: Never Used  Vaping Use  . Vaping Use: Never used  Substance Use Topics  . Alcohol use: Not Currently  . Drug use: Not Currently     Allergies   Patient has no known allergies.   Review of  Systems Review of Systems Per HPI  Physical Exam Triage Vital Signs ED Triage Vitals  Enc Vitals Group     BP 12/26/20 1052 138/82     Pulse Rate 12/26/20 1052 76     Resp 12/26/20 1052 18     Temp 12/26/20 1052 98.7 F (37.1 C)     Temp src --      SpO2 12/26/20 1052 95 %     Weight --      Height --      Head Circumference --      Peak Flow --      Pain Score 12/26/20 1053 0     Pain Loc --      Pain Edu? --      Excl. in [place]? --    No data found.  Updated Vital Signs BP 138/82 (BP Location: Left Arm)   Pulse 76   Temp 98.7 F (37.1 C)   Resp 18   SpO2 95%   Visual Acuity Right Eye Distance:   Left Eye Distance:   Bilateral Distance:    Right Eye Near:   Left Eye Near:    Bilateral Near:     Physical Exam Vitals and nursing note reviewed.  Constitutional:      Appearance: Normal appearance.  HENT:     Head: Atraumatic.     Mouth/Throat:     Mouth: Mucous membranes are moist.     Pharynx: Oropharynx is clear. No posterior oropharyngeal erythema.  Eyes:     Extraocular Movements: Extraocular movements intact.     Conjunctiva/sclera: Conjunctivae normal.  Cardiovascular:     Rate and Rhythm: Normal rate and regular rhythm.  Pulmonary:     Effort: Pulmonary effort is normal. No respiratory distress.     Breath sounds: Normal breath sounds. No wheezing or rales.  Abdominal:     General: Bowel sounds are normal. There is no distension.     Palpations: There is no mass.     Tenderness: There is no abdominal tenderness. There is no right CVA tenderness, left CVA tenderness, guarding or rebound.  Musculoskeletal:        General: Normal range of motion.     Cervical back: Normal range of motion and neck supple.  Skin:    General: Skin is warm and dry.  Neurological:     General: No focal deficit present.     Mental Status: He is oriented to person, place, and time.  Psychiatric:        Mood and Affect: Mood normal.        Thought Content: Thought  content normal.        Judgment: Judgment normal.      UC Treatments / Results  Labs (all labs ordered are listed, but only abnormal results are displayed) Labs Reviewed  POCT URINALYSIS DIP (MANUAL ENTRY) - Abnormal; Notable for the following components:      Result Value   Blood, UA  trace-intact (*)    All other components within normal limits  CBC WITH DIFFERENTIAL/PLATELET  COMPREHENSIVE METABOLIC PANEL    EKG   Radiology No results found.  Procedures Procedures (including critical care time)  Medications Ordered in UC Medications - No data to display  Initial Impression / Assessment and Plan / UC Course  I have reviewed the triage vital signs and the nursing notes.  Pertinent labs & imaging results that were available during my care of the patient were reviewed by me and considered in my medical decision making (see chart for details).     Trace hemoglobin in UA today but otherwise benign reassuring, lower suspicion for kidney stone given the mild and intermittent nature of pain as well as duration of symptoms.  Greater suspicion for a muscular strain from the yard work he was doing.  We will obtain some basic lab work for reassurance but discussed that he should follow-up with his primary care within the next week if not fully resolving for further evaluation and possibly some imaging of his abdomen for reassurance purposes as he is very anxious about having cancer.  Return for acutely worsening symptoms in the meantime.  Final Clinical Impressions(s) / UC Diagnoses   Final diagnoses:  RUQ pain     Discharge Instructions     Take Tylenol as needed for the pain    ED Prescriptions    None     PDMP not reviewed this encounter.   Volney American, [place] 12/26/20 1142

## 2020-12-27 LAB — CBC WITH DIFFERENTIAL/PLATELET
Basophils Absolute: 0 x10E3/uL (ref 0.0–0.2)
Basos: 0 %
EOS (ABSOLUTE): 0.2 x10E3/uL (ref 0.0–0.4)
Eos: 3 %
Hematocrit: 48.4 % (ref 37.5–51.0)
Hemoglobin: 16.8 g/dL (ref 13.0–17.7)
Immature Grans (Abs): 0 x10E3/uL (ref 0.0–0.1)
Immature Granulocytes: 0 %
Lymphocytes Absolute: 1.5 x10E3/uL (ref 0.7–3.1)
Lymphs: 23 %
MCH: 31.1 pg (ref 26.6–33.0)
MCHC: 34.7 g/dL (ref 31.5–35.7)
MCV: 90 fL (ref 79–97)
Monocytes Absolute: 0.5 x10E3/uL (ref 0.1–0.9)
Monocytes: 7 %
Neutrophils Absolute: 4.4 x10E3/uL (ref 1.4–7.0)
Neutrophils: 67 %
Platelets: 153 x10E3/uL (ref 150–450)
RBC: 5.4 x10E6/uL (ref 4.14–5.80)
RDW: 13.4 % (ref 11.6–15.4)
WBC: 6.7 x10E3/uL (ref 3.4–10.8)

## 2020-12-27 LAB — COMPREHENSIVE METABOLIC PANEL WITH GFR
ALT: 16 IU/L (ref 0–44)
AST: 19 IU/L (ref 0–40)
Albumin/Globulin Ratio: 1.7 (ref 1.2–2.2)
Albumin: 4.5 g/dL (ref 3.6–4.6)
Alkaline Phosphatase: 52 IU/L (ref 44–121)
BUN/Creatinine Ratio: 16 (ref 10–24)
BUN: 17 mg/dL (ref 8–27)
Bilirubin Total: 0.8 mg/dL (ref 0.0–1.2)
CO2: 19 mmol/L — ABNORMAL LOW (ref 20–29)
Calcium: 9.5 mg/dL (ref 8.6–10.2)
Chloride: 103 mmol/L (ref 96–106)
Creatinine, Ser: 1.05 mg/dL (ref 0.76–1.27)
Globulin, Total: 2.6 g/dL (ref 1.5–4.5)
Glucose: 206 mg/dL — ABNORMAL HIGH (ref 65–99)
Potassium: 4.6 mmol/L (ref 3.5–5.2)
Sodium: 141 mmol/L (ref 134–144)
Total Protein: 7.1 g/dL (ref 6.0–8.5)
eGFR: 71 mL/min/1.73

## 2021-01-24 ENCOUNTER — Ambulatory Visit: Payer: Medicare HMO | Attending: Critical Care Medicine | Admitting: Pharmacist

## 2021-01-24 ENCOUNTER — Other Ambulatory Visit: Payer: Self-pay

## 2021-01-24 DIAGNOSIS — E119 Type 2 diabetes mellitus without complications: Secondary | ICD-10-CM | POA: Diagnosis not present

## 2021-01-24 LAB — GLUCOSE, POCT (MANUAL RESULT ENTRY): POC Glucose: 142 mg/dl — AB (ref 70–99)

## 2021-01-24 MED ORDER — ONETOUCH VERIO REFLECT W/DEVICE KIT: PACK | 0 refills | Status: DC

## 2021-01-24 MED ORDER — ONETOUCH DELICA LANCETS 33G MISC: 2 refills | Status: DC

## 2021-01-24 MED ORDER — ONETOUCH VERIO VI STRP: ORAL_STRIP | 2 refills | Status: DC

## 2021-01-24 NOTE — Progress Notes (Signed)
    S:     No chief complaint on file.  Patient arrives in good spirits.  Presents for diabetes evaluation, education, and management Patient was referred and last seen by Primary Care Provider on 12/05/2020. I saw him on 12/23/2020 and increased his metformin dose. Today, he reports doing well. He is using the metformin BID as prescribed. Endorses occasional diarrhea but is tolerable.   Family/Social History:  -FHx: no pertinent positives  -Tobacco: never smoker -Alcohol: none currently   Insurance coverage/medication affordability: [employer]  Medication adherence reported.   Current diabetes medications include: metformin 500 mg XR - takes 2 tablets (1000mg  total) BID Current hypertension medications include: verapamil 240 mg CR, enalapril 5 mg daily  Current hyperlipidemia medications include: simvastatin 10 mg daily   Patient denies hypoglycemic events.  Patient reported dietary habits:  -Tries to utilize a diabetic diet  -Reports that since his visit with Dr. Joya Gaskins, he has cut out sweets, sodas, bread, etc.   Patient-reported exercise habits:  -He is now walking ~20 minutes daily   Patient denies nocturia (nighttime urination).  Patient denies neuropathy (nerve pain). Patient denies visual changes. Patient reports self foot exams.     O:  POCT: 142   Lab Results  Component Value Date   HGBA1C 7.8 (H) 12/05/2020   There were no vitals filed for this visit.  Lipid Panel     Component Value Date/Time   CHOL 141 12/05/2020 1012   TRIG 171 (H) 12/05/2020 1012   HDL 43 12/05/2020 1012   CHOLHDL 3.3 12/05/2020 1012   LDLCALC 69 12/05/2020 1012   Home fasting blood sugars: does not have a meter. Requesting a [employer].   Clinical Atherosclerotic Cardiovascular Disease (ASCVD): Yes  The ASCVD Risk score Mikey Bussing DC Jr., et al., 2013) failed to calculate for the following reasons:   The 2013 ASCVD risk score is only valid for ages 36 to 48     A/P: Diabetes longstanding currently above goal. Given his degree of functionality, age and comorbid conditions, I recommend an A1c goal <7.5%. Patient is able to verbalize appropriate hypoglycemia management plan. Medication adherence appears appropriate. Will optimize metformin.  -Continue metformin dose. -Extensively discussed pathophysiology of diabetes, recommended lifestyle interventions, dietary effects on blood sugar control -Counseled on s/sx of and management of hypoglycemia -Next A1C anticipated 03/2021.   Written patient instructions provided.  Total time in face to face counseling 30 minutes.   Follow up PCP Clinic Visit 02/10/2021.   Benard Halsted, PharmD, [place], [place] [phone]

## 2021-02-09 NOTE — Progress Notes (Signed)
Subjective:    Patient ID: Edward Butler, male    DOB: Jul 07, 1941, 80 y.o.   MRN: [id number]  History of Present Illness: 80 y.o.M est PCP  10/24/2020 This a very pleasant 80 year old male here to establish by way of referral from urgent care.  Patient formally lived in [place] and had a primary care physician [name] for 20 years in [place].  Patient now lives in [place] with his son and family.  He is now fully retired.  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  Patient now is fully retired.  We tried to have a video visit he does have an iPhone but he does not have the current updated operating system to interface with our video system.  We therefore converted to a telephone visit.  The patient states he has a history of paroxysmal atrial fibrillation and actually is now in chronic atrial fibrillation on chronic Eliquis.  Patient has aortic stenosis hypertension.  Also diabetes without long-term use of insulin on once daily 500 mg Metformin.  Patient also has senile cataracts and also partial vitreous detachment of both eyes.  Patient has an ophthalmologist in the [facility] system he follows with.  Prior history of colon polyps GI states he does not need further colonoscopies at this time unless he is symptomatic.  His gastroenterologist is in [place] at [place].  Patient does have an active cardiologist in the [facility] system and is seeing him on a regular basis his records are available in care everywhere and I have reviewed them.  His last visit was in December and the patient was stable he has a follow-up visit in 6 months.  He also has a Paediatric nurse in [place] where he has skin cancers removed.  His spouse unfortunately has now Alzheimer's and is in [place] in a nursing home.  The patient has no real active complaints or symptoms this time  is doing quite well.  He uses [facility] order and they keep him up-to-date on all of his medications and he has no needs for any refills at this time.  In reviewing the patient's primary care gaps the patient has received full vaccinations with a flu shot this year he has had a Pneumovax 23 valent.  He has not had a tetanus shot in many years.  The patient did receive his full COVID vaccine series including his booster.  The patient did have a foot exam 2 months ago.  For the diabetes the patient states his last A1c was 7.4 several months ago.  I do not have access to the patient's recent labs just obtained 2 months ago at his primary care doctor's office.  When he comes to the office I will have him sign a release form so we get records from the [place] primary care site  The patient does not smoke or drink alcohol or use other illicit drugs  The patient is pursuing an active lifestyle  12/05/2020 This is a pleasant 80 year old male here to establish for primary care with a face-to-face visit.  The last visit in March was over the phone.  Patient does have history of hypertension, type 2 diabetes, aortic stenosis moderate, history of chronic atrial fibrillation, chronic anticoagulation, dyslipidemia, bilateral cataracts, hypothyroidism, decreased sensorineural hearing, history of colon polyps no longer getting routine colonoscopy due to age, posterior vitreous detachment both eyes  in the past, myopia with astigmatism.  Overall the patient's been doing well he is maintaining metformin once daily his last A1c was 7.4 with his primary care provider however on arrival today his blood glucose is greater than 300 and his A1c is 7.7 which is an increase The patient has moved in with his daughter and son-in-law in [place] his wife still has Alzheimer's and is in a nursing home in [place] which she visits twice a week  Patient's exercise program is not quite fully engaged and his diet has  varied  The patient's biggest complaint is he would like to establish with a new ophthalmologist in the [place] area he does have an established cardiologist that he sees twice a year in [place] we have access to those records also note this patient has sleep apnea and is on a CPAP machine for the past 10 years this is prescribed by Dr. [place].  Patient denies any blood in the stool on the anticoagulant no other significant bleeding.  He does see a dermatologist named Dr. Frederico Hamman in dermatology in [place] with [facility] who takes care of his skin conditions  Patient does have prostatism and needs a PSA check he is on Proscar  I did a complete medication reconciliation and the med list is now accurate  We do not yet have records from the patient's primary care provider in [place] we will make this request  Patient does use [facility] pharmacy service and wants refill sent to that service if requested in the future  7/11 This is a pleasant 80 year old male seen in return follow-up for multiple medical problems including chronic atrial fibrillation paroxysmal, hypertension, aortic stenosis, type 2 diabetes, hypertension, sleep apnea on CPAP.  Patient states his glycemic control has been improved he still occasionally though has blood sugars in the 200 range postprandial.  His fastings run in the 120.  Patient's A1c on arrival today is 6.3.  Patient is on CPAP at a setting of 13 cm water pressure.  Tolerates CPAP well he has a 20-year-old machine he may need to have this replaced soon.  He does have lots of breads with his meals and often baking potatoes.  On arrival blood sugar today is 142 and blood pressure is 121/67  The patient just had a lesion removed from his left ear per dermatology  Past Medical History:  Diagnosis Date   Atrial fibrillation ([place])    Diabetes mellitus without complication ([place])    Hypertension    Stroke [facility])      History reviewed. No  pertinent family history.   Social History   Socioeconomic History   Marital status: Married    Spouse name: Not on file   Number of children: Not on file   Years of education: Not on file   Highest education level: Not on file  Occupational History   Not on file  Tobacco Use   Smoking status: Never   Smokeless tobacco: Never  Vaping Use   Vaping Use: Never used  Substance and Sexual Activity   Alcohol use: Not Currently   Drug use: Not Currently   Sexual activity: Not Currently  Other Topics Concern   Not on file  Social History Narrative   Not on file   Social Determinants of Health   Financial Resource Strain: Not on file  Food Insecurity: Not on file  Transportation Needs: Not on file  Physical Activity: Not on file  Stress: Not on file  Social Connections: Not on file  Intimate Partner Violence: Not on file     No Known Allergies   Outpatient Medications Prior to Visit  Medication Sig Dispense Refill   apixaban (ELIQUIS) 5 MG TABS tablet Take 5 mg by mouth 2 (two) times daily.     Blood Glucose Monitoring Suppl (ONETOUCH VERIO REFLECT) w/Device KIT Use to check blood sugar once daily. E11.9 1 kit 0   cholecalciferol (VITAMIN D3) 25 MCG (1000 UNIT) tablet Take 2,000 Units by mouth daily.     DHA-EPA-Flaxseed Oil-Vitamin E (THERA TEARS NUTRITION PO) Take 3 drops by mouth daily.     enalapril (VASOTEC) 5 MG tablet Take 5 mg by mouth daily.     fluticasone (FLONASE) 50 MCG/ACT nasal spray Place 2 sprays into both nostrils daily as needed.     glucose blood (ONETOUCH VERIO) test strip Use to check blood sugar once daily. E11.9 100 each 2   levothyroxine (SYNTHROID) 25 MCG tablet Take 25 mcg by mouth daily before breakfast.     magnesium oxide (MAG-OX) 400 MG tablet Take by mouth daily.     metFORMIN (GLUCOPHAGE-XR) 500 MG 24 hr tablet Take 4 tablets (2,000 mg total) by mouth daily. (Patient taking differently: Take 1,000 mg by mouth 2 (two) times daily with a meal.)  360 tablet 1   OneTouch Delica Lancets 38H MISC Use to check blood sugar once daily. E11.9 100 each 2   simvastatin (ZOCOR) 10 MG tablet Take 10 mg by mouth daily.     verapamil (CALAN-SR) 240 MG CR tablet Take 240 mg by mouth daily.     Continuous Blood Gluc Receiver (FREESTYLE LIBRE 14 DAY READER) DEVI Use to check blood sugar TID. 1 each 6   Continuous Blood Gluc Sensor (FREESTYLE LIBRE 14 DAY SENSOR) MISC Use to check blood sugar TID. 1 each 6   finasteride (PROSCAR) 5 MG tablet Take 5 mg by mouth daily.     No facility-administered medications prior to visit.     Review of Systems  Constitutional: Negative.   HENT: Negative.    Respiratory: Negative.    Cardiovascular: Negative.   Gastrointestinal: Negative.   Genitourinary: Negative.   Musculoskeletal: Negative.   Skin:  Positive for rash.  Neurological: Negative.   Hematological:  Does not bruise/bleed easily.  Psychiatric/Behavioral: Negative.        Objective:   Physical Exam . Vitals:   02/10/21 1053  BP: 121/67  Pulse: 81  SpO2: 97%  Weight: 171 lb (77.6 kg)  Height: _0  (1.676 m)    Gen: Pleasant, well-nourished, in no distress,  normal affect  ENT: No lesions,  mouth clear,  oropharynx clear, no postnasal drip  Neck: No JVD, no TMG, no carotid bruits  Lungs: No use of accessory muscles, no dullness to percussion, clear without rales or rhonchi  Cardiovascular: RRR, heart sounds systolic ejection murmur heard in the aortic position , no gallops, no peripheral edema  Abdomen: protuberant, soft and NT, no HSM,  BS normal  Musculoskeletal: No deformities, no cyanosis or clubbing  Neuro: alert, non focal  Skin: Multiple areas of previous evidence of skin cancers on forehead and face that have been treated  Lab Results  Component Value Date   HGBA1C 6.3 02/10/2021        Assessment & Plan:  I personally reviewed all images and lab data in the [facility] system as well as any outside material available  during this office visit and agree with the  radiology impressions.   Benign essential HTN Hypertension stable this time no change in medications  PAF (paroxysmal atrial fibrillation) (HCC) Continue verapamil continue apixaban  OSA on CPAP Continue CPAP as prescribed  Type 2 diabetes mellitus without complication, without long-term current use of insulin (HCC) A1c at goal continue metformin we advised patient as to proper diet  Hypothyroidism Continue current dose of thyroid medicine  BPH (benign prostatic hyperplasia) Continue for finasteride  Chronic anticoagulation CBC stable continue apixaban no bleeding complications seen   [place] was seen today for hypertension.  Diagnoses and all orders for this visit:  Type 2 diabetes mellitus without complication, without long-term current use of insulin (HCC) -     HgB A1c -     Pneumococcal conjugate vaccine 13-valent  Benign essential HTN  PAF (paroxysmal atrial fibrillation) (HCC)  OSA on CPAP  Hypothyroidism, unspecified type  Benign prostatic hyperplasia without lower urinary tract symptoms  Chronic anticoagulation  Other orders -     finasteride (PROSCAR) 5 MG tablet; Take 1 tablet (5 mg total) by mouth daily. 13 Valent pneumococcal vaccine given

## 2021-02-10 ENCOUNTER — Ambulatory Visit: Payer: Medicare HMO | Attending: Critical Care Medicine | Admitting: Critical Care Medicine

## 2021-02-10 ENCOUNTER — Other Ambulatory Visit: Payer: Self-pay

## 2021-02-10 ENCOUNTER — Encounter: Payer: Self-pay | Admitting: Critical Care Medicine

## 2021-02-10 VITALS — BP 121/67 | HR 81 | Ht 66.0 in | Wt 171.0 lb

## 2021-02-10 DIAGNOSIS — Z23 Encounter for immunization: Secondary | ICD-10-CM

## 2021-02-10 DIAGNOSIS — G4733 Obstructive sleep apnea (adult) (pediatric): Secondary | ICD-10-CM | POA: Diagnosis not present

## 2021-02-10 DIAGNOSIS — E119 Type 2 diabetes mellitus without complications: Secondary | ICD-10-CM

## 2021-02-10 DIAGNOSIS — I480 Paroxysmal atrial fibrillation: Secondary | ICD-10-CM

## 2021-02-10 DIAGNOSIS — Z7901 Long term (current) use of anticoagulants: Secondary | ICD-10-CM

## 2021-02-10 DIAGNOSIS — E039 Hypothyroidism, unspecified: Secondary | ICD-10-CM

## 2021-02-10 DIAGNOSIS — N400 Enlarged prostate without lower urinary tract symptoms: Secondary | ICD-10-CM

## 2021-02-10 DIAGNOSIS — I10 Essential (primary) hypertension: Secondary | ICD-10-CM | POA: Diagnosis not present

## 2021-02-10 DIAGNOSIS — Z9989 Dependence on other enabling machines and devices: Secondary | ICD-10-CM

## 2021-02-10 LAB — POCT GLYCOSYLATED HEMOGLOBIN (HGB A1C): HbA1c, POC (controlled diabetic range): 6.3 % (ref 0.0–7.0)

## 2021-02-10 MED ORDER — FINASTERIDE 5 MG PO TABS: 5.0000 mg | ORAL_TABLET | Freq: Every day | ORAL | 2 refills | Status: DC

## 2021-02-10 NOTE — Assessment & Plan Note (Signed)
Continue for finasteride

## 2021-02-10 NOTE — Assessment & Plan Note (Signed)
Hypertension stable this time no change in medications

## 2021-02-10 NOTE — Assessment & Plan Note (Signed)
CBC stable continue apixaban no bleeding complications seen

## 2021-02-10 NOTE — Patient Instructions (Addendum)
We discussed dietary changes to improve glycemic control  Continue metformin as prescribed  Refills on your finasteride sent to [facility] mail order 90-day supply  Please let us know if you need refills on any other your medicines in the future  Hemoglobin A1c was checked today and was 6.3!  Prevnar 13 vaccine was given which is a pneumococcal pneumonia vaccination booster  Return to see Dr. Joya Gaskins 4 months  See attachment for dietary suggestions with your diabetes

## 2021-02-10 NOTE — Assessment & Plan Note (Signed)
Continue verapamil continue apixaban

## 2021-02-10 NOTE — Assessment & Plan Note (Signed)
Continue current dose of thyroid medicine

## 2021-02-10 NOTE — Assessment & Plan Note (Signed)
A1c at goal continue metformin we advised patient as to proper diet

## 2021-02-10 NOTE — Assessment & Plan Note (Signed)
Continue CPAP as prescribed. 

## 2021-02-19 LAB — HM DIABETES EYE EXAM

## 2021-02-26 ENCOUNTER — Other Ambulatory Visit: Payer: Self-pay | Admitting: Pharmacist

## 2021-02-26 ENCOUNTER — Other Ambulatory Visit: Payer: Self-pay

## 2021-02-26 MED ORDER — ACCU-CHEK FASTCLIX LANCETS MISC: 2 refills | Status: AC

## 2021-02-26 MED ORDER — ACCU-CHEK GUIDE ME W/DEVICE KIT: PACK | 0 refills | Status: DC

## 2021-02-26 MED ORDER — ACCU-CHEK GUIDE VI STRP: ORAL_STRIP | 2 refills | Status: DC

## 2021-03-13 ENCOUNTER — Other Ambulatory Visit: Payer: Self-pay

## 2021-03-13 ENCOUNTER — Ambulatory Visit
Admission: EM | Admit: 2021-03-13 | Discharge: 2021-03-13 | Disposition: A | Discharge status: Home / Self Care | Payer: Medicare HMO

## 2021-03-13 DIAGNOSIS — H65191 Other acute nonsuppurative otitis media, right ear: Secondary | ICD-10-CM | POA: Diagnosis not present

## 2021-03-13 DIAGNOSIS — H9201 Otalgia, right ear: Secondary | ICD-10-CM

## 2021-03-13 DIAGNOSIS — E119 Type 2 diabetes mellitus without complications: Secondary | ICD-10-CM

## 2021-03-13 MED ORDER — AMOXICILLIN 875 MG PO TABS: 875.0000 mg | ORAL_TABLET | Freq: Two times a day (BID) | ORAL | 0 refills | Status: DC

## 2021-03-13 MED ORDER — CETIRIZINE HCL 5 MG PO TABS: 5.0000 mg | ORAL_TABLET | Freq: Every day | ORAL | 0 refills | Status: DC

## 2021-03-13 NOTE — ED Provider Notes (Signed)
[place]   MRN: [id number] DOB: [birth year]  Subjective:   Edward Butler is a 80 y.o. male presenting for 2-day history of acute onset persistent and worsening right ear pain.  Patient recently started using a new hearing aid about a month ago.  Was doing very well with this but unfortunately started having the ear pain at the right side and had to take his hearing aid out.  Thereafter his symptoms continued.  Denies fever, ear drainage, tinnitus.  No current facility-administered medications for this encounter.  Current Outpatient Medications:    Accu-Chek FastClix Lancets MISC, Use to check blood sugar once daily. E11.9, Disp: 100 each, Rfl: 2   apixaban (ELIQUIS) 5 MG TABS tablet, Take 5 mg by mouth 2 (two) times daily., Disp: , Rfl:    Blood Glucose Monitoring Suppl (ACCU-CHEK GUIDE ME) w/Device KIT, Use to check blood sugar once daily. E11.9, Disp: 1 kit, Rfl: 0   cholecalciferol (VITAMIN D3) 25 MCG (1000 UNIT) tablet, Take 2,000 Units by mouth daily., Disp: , Rfl:    DHA-EPA-Flaxseed Oil-Vitamin E (THERA TEARS NUTRITION PO), Take 3 drops by mouth daily., Disp: , Rfl:    DUREZOL 0.05 % EMUL, Place 1 drop into the left eye 4 (four) times daily., Disp: , Rfl:    enalapril (VASOTEC) 5 MG tablet, Take 5 mg by mouth daily., Disp: , Rfl:    finasteride (PROSCAR) 5 MG tablet, Take 1 tablet (5 mg total) by mouth daily., Disp: 90 tablet, Rfl: 2   fluticasone (FLONASE) 50 MCG/ACT nasal spray, Place 2 sprays into both nostrils daily as needed., Disp: , Rfl:    glucose blood (ACCU-CHEK GUIDE) test strip, Use to check blood sugar once daily. E11.9, Disp: 100 each, Rfl: 2   levothyroxine (SYNTHROID) 25 MCG tablet, Take 25 mcg by mouth daily before breakfast., Disp: , Rfl:    magnesium oxide (MAG-OX) 400 MG tablet, Take by mouth daily., Disp: , Rfl:    metFORMIN (GLUCOPHAGE-XR) 500 MG 24 hr tablet, Take 4 tablets (2,000 mg total) by mouth daily. (Patient taking differently: Take 1,000  mg by mouth 2 (two) times daily with a meal.), Disp: 360 tablet, Rfl: 1   moxifloxacin (VIGAMOX) 0.5 % ophthalmic solution, Apply 1 drop to eye 4 (four) times daily., Disp: , Rfl:    simvastatin (ZOCOR) 10 MG tablet, Take 10 mg by mouth daily., Disp: , Rfl:    verapamil (CALAN-SR) 240 MG CR tablet, Take 240 mg by mouth daily., Disp: , Rfl:    No Known Allergies  Past Medical History:  Diagnosis Date   Atrial fibrillation ([place])    Diabetes mellitus without complication ([place])    Hypertension    Stroke [facility])      Past Surgical History:  Procedure Laterality Date   skin cancer surgeries       History reviewed. No pertinent family history.  Social History   Tobacco Use   Smoking status: Never   Smokeless tobacco: Never  Vaping Use   Vaping Use: Never used  Substance Use Topics   Alcohol use: Not Currently   Drug use: Not Currently    ROS   Objective:   Vitals: BP 116/67 (BP Location: Left Arm)   Pulse 70   Temp 98 F (36.7 C) (Oral)   Resp 18   SpO2 96%   Physical Exam Constitutional:      General: He is not in acute distress.    Appearance: Normal appearance. He is well-developed and normal weight.  He is not ill-appearing, toxic-appearing or diaphoretic.  HENT:     Head: Normocephalic and atraumatic.     Right Ear: Ear canal and external ear normal. There is no impacted cerumen. Tympanic membrane is erythematous.     Left Ear: Tympanic membrane, ear canal and external ear normal. There is no impacted cerumen.     Nose: Nose normal. No congestion or rhinorrhea.     Mouth/Throat:     Mouth: Mucous membranes are moist.     Pharynx: Oropharynx is clear. No oropharyngeal exudate or posterior oropharyngeal erythema.  Eyes:     General: No scleral icterus.       Right eye: No discharge.        Left eye: No discharge.     Extraocular Movements: Extraocular movements intact.     Conjunctiva/sclera: Conjunctivae normal.     Pupils: Pupils are equal, round, and  reactive to light.  Cardiovascular:     Rate and Rhythm: Normal rate.  Pulmonary:     Effort: Pulmonary effort is normal.  Musculoskeletal:     Cervical back: Normal range of motion and neck supple. No rigidity. No muscular tenderness.  Neurological:     General: No focal deficit present.     Mental Status: He is alert and oriented to person, place, and time.  Psychiatric:        Mood and Affect: Mood normal.        Behavior: Behavior normal.        Thought Content: Thought content normal.        Judgment: Judgment normal.    Assessment and Plan :   PDMP not reviewed this encounter.  1. Other non-recurrent acute nonsuppurative otitis media of right ear   2. Right ear pain   3. Type 2 diabetes mellitus treated without insulin (HCC)     Start amoxicillin to cover for otitis media. Use supportive care otherwise.  Restart Flonase, Zyrtec.  Counseled patient on potential for adverse effects with medications prescribed/recommended today, ER and return-to-clinic precautions discussed, patient verbalized understanding.    Jaynee Eagles, PA-C 03/13/21 1512

## 2021-03-13 NOTE — ED Triage Notes (Signed)
Pt reports a 64moh/o using a new hearing aid. Two day h/o right ear pain. Pt removed his hearing aid without a decrease in sxs. Has been taking tylenol with relief. Denies uri sxs. No left ear sxs.

## 2021-03-21 ENCOUNTER — Ambulatory Visit
Admission: EM | Admit: 2021-03-21 | Discharge: 2021-03-21 | Disposition: A | Discharge status: Home / Self Care | Payer: Medicare HMO | Attending: Urgent Care | Admitting: Urgent Care

## 2021-03-21 ENCOUNTER — Other Ambulatory Visit: Payer: Self-pay

## 2021-03-21 ENCOUNTER — Ambulatory Visit: Payer: Self-pay

## 2021-03-21 DIAGNOSIS — H66001 Acute suppurative otitis media without spontaneous rupture of ear drum, right ear: Secondary | ICD-10-CM | POA: Diagnosis not present

## 2021-03-21 DIAGNOSIS — H60391 Other infective otitis externa, right ear: Secondary | ICD-10-CM

## 2021-03-21 DIAGNOSIS — H9201 Otalgia, right ear: Secondary | ICD-10-CM

## 2021-03-21 MED ORDER — HYDROCODONE-ACETAMINOPHEN 5-325 MG PO TABS: 1.0000 | ORAL_TABLET | Freq: Four times a day (QID) | ORAL | 0 refills | Status: DC | PRN

## 2021-03-21 NOTE — ED Triage Notes (Signed)
Pt present right ear pain, symptoms started over a week ago. Pt states right ear pain is in the middle of his ear.  Pt tried otc medication with no relief. Pt was seen on 03/13/2021 for same issue.

## 2021-03-21 NOTE — Telephone Encounter (Signed)
  Pt. Seen in UC 03/13/21 with right ear infection. Treated. Still having severe pain. No availability in the practice. Pt. Will go to UC.   Reason for Disposition  [1] SEVERE pain AND [2] not improved 2 hours after taking analgesic medication (e.g., ibuprofen or acetaminophen)  Answer Assessment - Initial Assessment Questions 1. LOCATION: "Which ear is involved?"     Right ear 2. ONSET: "When did the ear start hurting"      Last week 3. SEVERITY: "How bad is the pain?"  (Scale 1-10; mild, moderate or severe)   - MILD (1-3): doesn't interfere with normal activities    - MODERATE (4-7): interferes with normal activities or awakens from sleep    - SEVERE (8-10): excruciating pain, unable to do any normal activities      Severe 4. URI SYMPTOMS: "Do you have a runny nose or cough?"     No 5. FEVER: "Do you have a fever?" If Yes, ask: "What is your temperature, how was it measured, and when did it start?"     No 6. CAUSE: "Have you been swimming recently?", "How often do you use Q-TIPS?", "Have you had any recent air travel or scuba diving?"     No 7. OTHER SYMPTOMS: "Do you have any other symptoms?" (e.g., headache, stiff neck, dizziness, vomiting, runny nose, decreased hearing)     No 8. PREGNANCY: "Is there any chance you are pregnant?" "When was your last menstrual period?"     N/a  Protocols used: Bethann Punches

## 2021-03-21 NOTE — ED Provider Notes (Signed)
[place]   MRN: [id number] DOB: 29-May-1941  Subjective:   Edward Butler is a 80 y.o. male presenting for recheck on ongoing ear pain.  Patient was last seen here on 03/13/2021, started on amoxicillin for otitis media.  He continued to have pain and therefore went to his ear specialist.  He was started on Ciprodex in the past couple of days.  States that he continues to have moderate to severe pain.  Has been using ibuprofen for pain and inflammation but states that this is not strong enough.  He tried tramadol from his daughter and would like something stronger as this did not help either.  No current facility-administered medications for this encounter.  Current Outpatient Medications:    Accu-Chek FastClix Lancets MISC, Use to check blood sugar once daily. E11.9, Disp: 100 each, Rfl: 2   amoxicillin (AMOXIL) 875 MG tablet, Take 1 tablet (875 mg total) by mouth 2 (two) times daily., Disp: 20 tablet, Rfl: 0   apixaban (ELIQUIS) 5 MG TABS tablet, Take 5 mg by mouth 2 (two) times daily., Disp: , Rfl:    Blood Glucose Monitoring Suppl (ACCU-CHEK GUIDE ME) w/Device KIT, Use to check blood sugar once daily. E11.9, Disp: 1 kit, Rfl: 0   cetirizine (ZYRTEC) 5 MG tablet, Take 1 tablet (5 mg total) by mouth daily., Disp: 30 tablet, Rfl: 0   cholecalciferol (VITAMIN D3) 25 MCG (1000 UNIT) tablet, Take 2,000 Units by mouth daily., Disp: , Rfl:    DHA-EPA-Flaxseed Oil-Vitamin E (THERA TEARS NUTRITION PO), Take 3 drops by mouth daily., Disp: , Rfl:    DUREZOL 0.05 % EMUL, Place 1 drop into the left eye 4 (four) times daily., Disp: , Rfl:    enalapril (VASOTEC) 5 MG tablet, Take 5 mg by mouth daily., Disp: , Rfl:    finasteride (PROSCAR) 5 MG tablet, Take 1 tablet (5 mg total) by mouth daily., Disp: 90 tablet, Rfl: 2   fluticasone (FLONASE) 50 MCG/ACT nasal spray, Place 2 sprays into both nostrils daily as needed., Disp: , Rfl:    glucose blood (ACCU-CHEK GUIDE) test strip, Use to check  blood sugar once daily. E11.9, Disp: 100 each, Rfl: 2   levothyroxine (SYNTHROID) 25 MCG tablet, Take 25 mcg by mouth daily before breakfast., Disp: , Rfl:    magnesium oxide (MAG-OX) 400 MG tablet, Take by mouth daily., Disp: , Rfl:    metFORMIN (GLUCOPHAGE-XR) 500 MG 24 hr tablet, Take 4 tablets (2,000 mg total) by mouth daily. (Patient taking differently: Take 1,000 mg by mouth 2 (two) times daily with a meal.), Disp: 360 tablet, Rfl: 1   moxifloxacin (VIGAMOX) 0.5 % ophthalmic solution, Apply 1 drop to eye 4 (four) times daily., Disp: , Rfl:    simvastatin (ZOCOR) 10 MG tablet, Take 10 mg by mouth daily., Disp: , Rfl:    verapamil (CALAN-SR) 240 MG CR tablet, Take 240 mg by mouth daily., Disp: , Rfl:    No Known Allergies  Past Medical History:  Diagnosis Date   Atrial fibrillation ([place])    Diabetes mellitus without complication ([place])    Hypertension    Stroke [facility])      Past Surgical History:  Procedure Laterality Date   skin cancer surgeries       History reviewed. No pertinent family history.  Social History   Tobacco Use   Smoking status: Never   Smokeless tobacco: Never  Vaping Use   Vaping Use: Never used  Substance Use Topics   Alcohol  use: Not Currently   Drug use: Not Currently    ROS   Objective:   Vitals: BP 120/80 (BP Location: Left Arm)   Pulse 77   Temp 97.7 F (36.5 C) (Oral)   Resp 18   SpO2 98%   Physical Exam Constitutional:      General: He is not in acute distress.    Appearance: Normal appearance. He is well-developed and normal weight. He is not ill-appearing, toxic-appearing or diaphoretic.  HENT:     Head: Normocephalic and atraumatic.     Comments: Dull TMs with clumpy white drainage in the external ear canal.  There is trace swelling.  TM is intact.    Left Ear: Tympanic membrane, ear canal and external ear normal. There is no impacted cerumen.     Nose: Nose normal.     Mouth/Throat:     Pharynx: Oropharynx is clear.  Eyes:      General: No scleral icterus.       Right eye: No discharge.        Left eye: No discharge.     Extraocular Movements: Extraocular movements intact.     Pupils: Pupils are equal, round, and reactive to light.  Cardiovascular:     Rate and Rhythm: Normal rate.  Pulmonary:     Effort: Pulmonary effort is normal.  Musculoskeletal:     Cervical back: Normal range of motion.  Neurological:     Mental Status: He is alert and oriented to person, place, and time.  Psychiatric:        Mood and Affect: Mood normal.        Behavior: Behavior normal.        Thought Content: Thought content normal.        Judgment: Judgment normal.    Assessment and Plan :   I have reviewed the PDMP during this encounter.  1. Non-recurrent acute suppurative otitis media of right ear without spontaneous rupture of tympanic membrane   2. Right ear pain   3. Infective otitis externa of right ear     Will have patient finish out the antibiotic courses prescribed to him.  Recommended he schedule Tylenol and use hydrocodone for breakthrough pain.  Advised that he avoid NSAIDs given his heart history.  I will advise follow-up with your specialist going forward for refractory right ear infection. Counseled patient on potential for adverse effects with medications prescribed/recommended today, ER and return-to-clinic precautions discussed, patient verbalized understanding.    Jaynee Eagles, [place] 03/21/21 [phone]

## 2021-03-21 NOTE — Discharge Instructions (Addendum)
Please make sure you continue follow-up with your ear specialist.  Continue taking oral antibiotic that I prescribed you and also the antibiotic eardrops that your specialist prescribed to you.  Please schedule Tylenol at 500 mg - 650 mg once every 6 hours as needed for aches and pains.  If you still have pain despite taking Tylenol regularly, this is breakthrough pain.  You can use hydrocodone once every 6 hours for this.  Once your pain is better controlled, switch back to just Tylenol.

## 2021-03-24 ENCOUNTER — Ambulatory Visit: Payer: Self-pay

## 2021-03-24 ENCOUNTER — Ambulatory Visit: Payer: Medicare HMO | Attending: Family Medicine | Admitting: Family Medicine

## 2021-03-24 ENCOUNTER — Encounter: Payer: Self-pay | Admitting: Family Medicine

## 2021-03-24 ENCOUNTER — Other Ambulatory Visit: Payer: Self-pay

## 2021-03-24 VITALS — BP 152/65 | HR 62 | Ht 66.0 in | Wt 170.0 lb

## 2021-03-24 DIAGNOSIS — H66011 Acute suppurative otitis media with spontaneous rupture of ear drum, right ear: Secondary | ICD-10-CM | POA: Diagnosis not present

## 2021-03-24 MED ORDER — ACETAMINOPHEN-CODEINE #3 300-30 MG PO TABS
1.0000 | ORAL_TABLET | Freq: Four times a day (QID) | ORAL | 0 refills | Status: DC | PRN
Start: 2021-03-24 — End: 2021-07-03

## 2021-03-24 MED ORDER — AMOXICILLIN-POT CLAVULANATE 875-125 MG PO TABS: 1.0000 | ORAL_TABLET | Freq: Two times a day (BID) | ORAL | 0 refills | Status: DC

## 2021-03-24 NOTE — Patient Instructions (Signed)
Otitis Media, Adult  Otitis media occurs when there is inflammation and fluid in the middle ear space with signs and symptoms of an acute infection. The middle ear is a part of the ear that contains bones for hearing as well as air that helps send sounds to the brain. When infected fluid builds up in this space, it causes pressure and results in symptoms of acute otitis media. The eustachian tube connects the middle ear to the back of the nose (nasopharynx) and normally allows air into the middle ear space. If the eustachian tubebecomes blocked, fluid can build up and become infected. What are the causes? This condition is caused by a blockage in the eustachian tube. This can be caused by an object like mucus, or by swelling (edema) of the tube. Problems that can cause a blockage include: A cold or other upper respiratory infection. Allergies. An irritant, such as tobacco smoke. Enlarged adenoids. The adenoids are areas of soft tissue located high in the back of the throat, behind the nose and the roof of the mouth. They are part of the body's defense system (immune system). A mass in the nasopharynx. Damage to the ear caused by pressure changes (barotrauma). What are the signs or symptoms? Symptoms of this condition include: Ear pain. Fever. Decreased hearing. Tiredness (lethargy). Fluid leaking from the ear, if the eardrum is ruptured or has burst. Ringing in the ear. How is this diagnosed?  This condition is diagnosed with a physical exam. During the exam, your health care provider will use an instrument called an otoscope to look in your ear and check for redness, swelling, and fluid. He or she will also ask about yoursymptoms. Your health care provider may also order tests, such as: A pneumatic otoscopy. This is a test to check the movement of the eardrum. It is done by squeezing a small amount of air into the ear. A tympanogram is a test that shows how well the eardrum moves in response  to air pressure in the ear canal. It provides a graph for your health care provider to review. How is this treated? This condition can go away on its own within 3-5 days. But if the condition is caused by a bacterial infection and does not go away on its own, or if it keeps coming back, your health care provider may: Prescribe antibiotic medicine to treat the infection. Prescribe or recommend medicines to control pain. Follow these instructions at home: Take over-the-counter and prescription medicines only as told by your health care provider. If you were prescribed an antibiotic medicine, take it as told by your health care provider. Do not stop taking the antibiotic even if you start to feel better. Keep all follow-up visits as told by your health care provider. This is important. Contact a health care provider if: You have bleeding from your nose. There is a lump on your neck. You are not feeling better in 5 days. You feel worse instead of better. Get help right away if: You have severe pain that is not controlled with medicine. You have swelling, redness, or pain around your ear. You have stiffness in your neck. A part of your face is not moving (paralyzed). The bone behind your ear (mastoid) is tender when you touch it. You develop a severe headache. Summary Otitis media is redness, soreness, and swelling of the middle ear, usually resulting in pain. This condition can go away on its own within 3-5 days. If the problem does not go   away in 3-5 days, your health care provider may prescribe or recommend medicines to treat the infection or your symptoms. If you were prescribed an antibiotic medicine, take it as told by your health care provider. Follow all instructions you were given by your health care provider. This information is not intended to replace advice given to you by your health care provider. Make sure you discuss any questions you have with your healthcare  provider. Document Revised: 06/22/2019 Document Reviewed: 06/22/2019 Elsevier Patient Education  2022 [employer]  

## 2021-03-24 NOTE — Telephone Encounter (Signed)
Pt. Seen in UC with right ear pain. Still having pain. Appointment made today per [facility] agent. Pt. Verbalizes understanding.

## 2021-03-24 NOTE — Progress Notes (Signed)
Subjective:  Patient ID: Edward Butler, male    DOB: [birth year]  Age: 80 y.o. MRN: [id number]  CC: Ear Pain   HPI Edward Butler is a 80 y.o. year old male with a history of hypertension, A. fib, type 2 diabetes mellitus, BPH who presents for an acute visit.  Interval History: He complains of right ear pain. Completed a course of Amoxicillin 872m bid and Ciprodex but he has not had any relief. Symptoms have been on for 10-11 days.  Amoxicillin was prescribed at an urgent care visit for otitis media when symptoms started and his Ciprodex was prescribed by [place] Audi[place] He currently using his hearing aids but has not been using in his right ear due to his infection.  He has no fever or myalgias. BP is elevated but he attributes this to pain.  Past Medical History:  Diagnosis Date   Atrial fibrillation ([place]    Diabetes mellitus without complication ([place]    Hypertension    Stroke ([facility]     Past Surgical History:  Procedure Laterality Date   skin cancer surgeries       No family history on file.  No Known Allergies  Outpatient Medications Prior to Visit  Medication Sig Dispense Refill   Accu-Chek FastClix Lancets MISC Use to check blood sugar once daily. E11.9 100 each 2   apixaban (ELIQUIS) 5 MG TABS tablet Take 5 mg by mouth 2 (two) times daily.     Blood Glucose Monitoring Suppl (ACCU-CHEK GUIDE ME) w/Device KIT Use to check blood sugar once daily. E11.9 1 kit 0   cetirizine (ZYRTEC) 5 MG tablet Take 1 tablet (5 mg total) by mouth daily. 30 tablet 0   cholecalciferol (VITAMIN D3) 25 MCG (1000 UNIT) tablet Take 2,000 Units by mouth daily.     DHA-EPA-Flaxseed Oil-Vitamin E (THERA TEARS NUTRITION PO) Take 3 drops by mouth daily.     DUREZOL 0.05 % EMUL Place 1 drop into the left eye 4 (four) times daily.     enalapril (VASOTEC) 5 MG tablet Take 5 mg by mouth daily.     finasteride (PROSCAR) 5 MG tablet Take 1 tablet (5 mg total) by mouth daily. 90 tablet 2    fluticasone (FLONASE) 50 MCG/ACT nasal spray Place 2 sprays into both nostrils daily as needed.     glucose blood (ACCU-CHEK GUIDE) test strip Use to check blood sugar once daily. E11.9 100 each 2   HYDROcodone-acetaminophen (NORCO/VICODIN) 5-325 MG tablet Take 1 tablet by mouth every 6 (six) hours as needed for severe pain. 10 tablet 0   levothyroxine (SYNTHROID) 25 MCG tablet Take 25 mcg by mouth daily before breakfast.     magnesium oxide (MAG-OX) 400 MG tablet Take by mouth daily.     moxifloxacin (VIGAMOX) 0.5 % ophthalmic solution Apply 1 drop to eye 4 (four) times daily.     simvastatin (ZOCOR) 10 MG tablet Take 10 mg by mouth daily.     verapamil (CALAN-SR) 240 MG CR tablet Take 240 mg by mouth daily.     metFORMIN (GLUCOPHAGE-XR) 500 MG 24 hr tablet Take 4 tablets (2,000 mg total) by mouth daily. (Patient taking differently: Take 1,000 mg by mouth 2 (two) times daily with a meal.) 360 tablet 1   amoxicillin (AMOXIL) 875 MG tablet Take 1 tablet (875 mg total) by mouth 2 (two) times daily. (Patient not taking: Reported on 03/24/2021) 20 tablet 0   No facility-administered medications prior to visit.  ROS Review of Systems  Constitutional:  Negative for activity change and appetite change.  HENT:  Positive for ear pain. Negative for sinus pressure and sore throat.   Eyes:  Negative for visual disturbance.  Respiratory:  Negative for cough, chest tightness and shortness of breath.   Cardiovascular:  Negative for chest pain and leg swelling.  Gastrointestinal:  Negative for abdominal distention, abdominal pain, constipation and diarrhea.  Endocrine: Negative.   Genitourinary:  Negative for dysuria.  Musculoskeletal:  Negative for joint swelling and myalgias.  Skin:  Negative for rash.  Allergic/Immunologic: Negative.   Neurological:  Negative for weakness, light-headedness and numbness.  Psychiatric/Behavioral:  Negative for dysphoric mood and suicidal ideas.    Objective:   BP (!) 152/65   Pulse 62   Ht 5' 6"  (1.676 m)   Wt 170 lb (77.1 kg)   SpO2 97%   BMI 27.44 kg/m   BP/Weight 03/24/2021 03/21/2021 04/04/4096  Systolic BP 353 299 242  Diastolic BP 65 80 67  Wt. (Lbs) 170 - -  BMI 27.44 - -      Physical Exam Constitutional:      Appearance: He is well-developed.  HENT:     Ears:     Comments: Purulent drainage in right eustachian tube, unable to identify TM Cardiovascular:     Rate and Rhythm: Normal rate.     Heart sounds: Normal heart sounds. No murmur heard. Pulmonary:     Effort: Pulmonary effort is normal.     Breath sounds: Normal breath sounds. No wheezing or rales.  Chest:     Chest wall: No tenderness.  Abdominal:     General: Bowel sounds are normal. There is no distension.     Palpations: Abdomen is soft. There is no mass.     Tenderness: There is no abdominal tenderness.  Musculoskeletal:        General: Normal range of motion.     Right lower leg: No edema.     Left lower leg: No edema.  Neurological:     Mental Status: He is alert and oriented to person, place, and time.  Psychiatric:        Mood and Affect: Mood normal.    CMP Latest Ref Rng & Units 12/26/2020 12/05/2020  Glucose 65 - 99 mg/dL 206(H) 313(H)  BUN 8 - 27 mg/dL 17 17  Creatinine 0.76 - 1.27 mg/dL 1.05 1.10  Sodium 134 - 144 mmol/L 141 138  Potassium 3.5 - 5.2 mmol/L 4.6 4.2  Chloride 96 - 106 mmol/L 103 101  CO2 20 - 29 mmol/L 19(L) 21  Calcium 8.6 - 10.2 mg/dL 9.5 9.6  Total Protein 6.0 - 8.5 g/dL 7.1 6.7  Total Bilirubin 0.0 - 1.2 mg/dL 0.8 0.7  Alkaline Phos 44 - 121 IU/L 52 64  AST 0 - 40 IU/L 19 13  ALT 0 - 44 IU/L 16 14    Lipid Panel     Component Value Date/Time   CHOL 141 12/05/2020 1012   TRIG 171 (H) 12/05/2020 1012   HDL 43 12/05/2020 1012   CHOLHDL 3.3 12/05/2020 1012   LDLCALC 69 12/05/2020 1012    CBC    Component Value Date/Time   WBC 6.7 12/26/2020 1140   RBC 5.40 12/26/2020 1140   HGB 16.8 12/26/2020 1140   HCT  48.4 12/26/2020 1140   PLT 153 12/26/2020 1140   MCV 90 12/26/2020 1140   MCH 31.1 12/26/2020 1140   MCHC 34.7 12/26/2020 1140  RDW 13.4 12/26/2020 1140   LYMPHSABS 1.5 12/26/2020 1140   EOSABS 0.2 12/26/2020 1140   BASOSABS 0.0 12/26/2020 1140    Lab Results  Component Value Date   HGBA1C 6.3 02/10/2021    Assessment & Plan:  1. Non-recurrent acute suppurative otitis media of right ear with spontaneous rupture of tympanic membrane Uncontrolled Symptoms have progressed Examination findings reveal suppurative otitis media which was previously non suppurative from UC notes Placed on Augmentin - Aerobic Culture - Ambulatory referral to ENT - acetaminophen-codeine (TYLENOL #3) 300-30 MG tablet; Take 1 tablet by mouth every 6 (six) hours as needed for moderate pain.  Dispense: 20 tablet; Refill: 0 - amoxicillin-clavulanate (AUGMENTIN) 875-125 MG tablet; Take 1 tablet by mouth 2 (two) times daily.  Dispense: 20 tablet; Refill: 0    Meds ordered this encounter  Medications   acetaminophen-codeine (TYLENOL #3) 300-30 MG tablet    Sig: Take 1 tablet by mouth every 6 (six) hours as needed for moderate pain.    Dispense:  20 tablet    Refill:  0   amoxicillin-clavulanate (AUGMENTIN) 875-125 MG tablet    Sig: Take 1 tablet by mouth 2 (two) times daily.    Dispense:  20 tablet    Refill:  0    Follow-up: Return if symptoms worsen or fail to improve.       Charlott Rakes, MD, FAAFP. [facility] [place], [place]   03/24/2021, 6:27 PM

## 2021-03-25 LAB — AEROBIC CULTURE

## 2021-03-25 LAB — SPECIMEN STATUS REPORT

## 2021-03-26 ENCOUNTER — Other Ambulatory Visit: Payer: Self-pay | Admitting: Family Medicine

## 2021-03-26 ENCOUNTER — Telehealth: Payer: Self-pay | Admitting: Critical Care Medicine

## 2021-03-26 MED ORDER — CIPROFLOXACIN-DEXAMETHASONE 0.3-0.1 % OT SUSP: 4.0000 [drp] | Freq: Two times a day (BID) | OTIC | 0 refills | Status: DC

## 2021-03-26 NOTE — Progress Notes (Signed)
I spoke with ENT and was able to obtain an appointment. Scheduled with Dr. Pollie Friar practice and they should be calling him with the details of the appointment for Monday 8/29.  I have also sent a prescription for Ciprodex to be used in addition to his Augmentin prescription until his appointment.

## 2021-03-26 NOTE — Telephone Encounter (Signed)
Pt states he is in severe pain. He has not heard from Dr Lucia Gaskins office concerning referral. Called Dr Lucia Gaskins office, they say they do not do urgent referrals, but if Dr Margarita Rana will call Dr Lucia Gaskins, she will get him out of room to speak with her. Will attempt to contact Dr Margarita Rana. Advised pt of the plan and he was appreciative. Can someone ask Dr Margarita Rana to call dr Lucia Gaskins for him to be seen urgently?

## 2021-03-27 ENCOUNTER — Telehealth: Payer: Self-pay | Admitting: Critical Care Medicine

## 2021-03-27 NOTE — Progress Notes (Signed)
Left message from Dr. Margarita Rana on voicemail. Advised  to return call.

## 2021-03-27 NOTE — Telephone Encounter (Signed)
Left message from Dr. Margarita Rana on voicemail. Encouraged to call office for concerns.

## 2021-03-27 NOTE — Telephone Encounter (Signed)
Copied from [place]. Topic: General - Inquiry >> Mar 25, 2021 11:05 AM Loma [place] wrote: Pt needs a fu call codine med that [facility] gave him yesterday is not helping at all. He is still in pain and wants to know if Dr Smitty Pluck nurse could pls call him and they can give another med? Wants to talk to [facility] nurse,   [phone]

## 2021-03-31 ENCOUNTER — Other Ambulatory Visit: Payer: Self-pay

## 2021-03-31 ENCOUNTER — Ambulatory Visit (INDEPENDENT_AMBULATORY_CARE_PROVIDER_SITE_OTHER): Payer: Medicare HMO | Admitting: Otolaryngology

## 2021-03-31 DIAGNOSIS — H6121 Impacted cerumen, right ear: Secondary | ICD-10-CM

## 2021-03-31 DIAGNOSIS — H60311 Diffuse otitis externa, right ear: Secondary | ICD-10-CM

## 2021-03-31 MED ORDER — CIPROFLOXACIN HCL 500 MG PO TABS: 500.0000 mg | ORAL_TABLET | Freq: Two times a day (BID) | ORAL | 0 refills | Status: AC

## 2021-03-31 MED ORDER — CIPROFLOXACIN-DEXAMETHASONE 0.3-0.1 % OT SUSP: 4.0000 [drp] | Freq: Two times a day (BID) | OTIC | 0 refills | Status: DC

## 2021-03-31 NOTE — Progress Notes (Signed)
HPI: Edward Butler is a 80 y.o. male who presents is referred by Dr. Margarita Rana for evaluation of right ear infection.  He has had a right earache now for about 3 weeks.  He states that a culture was obtained but I cannot find a culture result.  He was treated with Augmentin and was referred here. He wears bilateral hearing aids but has not been wearing the hearing in the right ear..  Past Medical History:  Diagnosis Date   Atrial fibrillation ([place])    Diabetes mellitus without complication ([place])    Hypertension    Stroke [facility])    Past Surgical History:  Procedure Laterality Date   skin cancer surgeries      Social History   Socioeconomic History   Marital status: Married    Spouse name: Not on file   Number of children: Not on file   Years of education: Not on file   Highest education level: Not on file  Occupational History   Not on file  Tobacco Use   Smoking status: Never   Smokeless tobacco: Never  Vaping Use   Vaping Use: Never used  Substance and Sexual Activity   Alcohol use: Not Currently   Drug use: Not Currently   Sexual activity: Not Currently  Other Topics Concern   Not on file  Social History Narrative   Not on file   Social Determinants of Health   Financial Resource Strain: Not on file  Food Insecurity: Not on file  Transportation Needs: Not on file  Physical Activity: Not on file  Stress: Not on file  Social Connections: Not on file   No family history on file. No Known Allergies Prior to Admission medications   Medication Sig Start Date End Date Taking? Authorizing Provider  Accu-Chek FastClix Lancets MISC Use to check blood sugar once daily. E11.9 02/26/21   Elsie Stain, MD  acetaminophen-codeine (TYLENOL #3) 300-30 MG tablet Take 1 tablet by mouth every 6 (six) hours as needed for moderate pain. 03/24/21   Charlott Rakes, MD  amoxicillin-clavulanate (AUGMENTIN) 875-125 MG tablet Take 1 tablet by mouth 2 (two) times daily. 03/24/21   Charlott Rakes, MD  apixaban (ELIQUIS) 5 MG TABS tablet Take 5 mg by mouth 2 (two) times daily. 07/13/19   [provider]  Blood Glucose Monitoring Suppl (ACCU-CHEK GUIDE ME) w/Device KIT Use to check blood sugar once daily. E11.9 02/26/21   Elsie Stain, MD  cetirizine (ZYRTEC) 5 MG tablet Take 1 tablet (5 mg total) by mouth daily. 03/13/21   Jaynee Eagles, PA-C  cholecalciferol (VITAMIN D3) 25 MCG (1000 UNIT) tablet Take 2,000 Units by mouth daily.    [provider]  ciprofloxacin-dexamethasone (CIPRODEX) OTIC suspension Place 4 drops into the right ear 2 (two) times daily. 03/26/21   Charlott Rakes, MD  DHA-EPA-Flaxseed Oil-Vitamin E (THERA TEARS NUTRITION PO) Take 3 drops by mouth daily.    [provider]  DUREZOL 0.05 % EMUL Place 1 drop into the left eye 4 (four) times daily. 03/11/21   [provider]  enalapril (VASOTEC) 5 MG tablet Take 5 mg by mouth daily. 09/28/18   [provider]  finasteride (PROSCAR) 5 MG tablet Take 1 tablet (5 mg total) by mouth daily. 02/10/21   Elsie Stain, MD  fluticasone (FLONASE) 50 MCG/ACT nasal spray Place 2 sprays into both nostrils daily as needed.    [provider]  glucose blood (ACCU-CHEK GUIDE) test strip Use to check blood  sugar once daily. E11.9 02/26/21   Elsie Stain, MD  HYDROcodone-acetaminophen (NORCO/VICODIN) 5-325 MG tablet Take 1 tablet by mouth every 6 (six) hours as needed for severe pain. 03/21/21   Jaynee Eagles, PA-C  levothyroxine (SYNTHROID) 25 MCG tablet Take 25 mcg by mouth daily before breakfast. 12/03/18   [provider]  magnesium oxide (MAG-OX) 400 MG tablet Take by mouth daily. 12/13/18   [provider]  metFORMIN (GLUCOPHAGE-XR) 500 MG 24 hr tablet Take 4 tablets (2,000 mg total) by mouth daily. Patient taking differently: Take 1,000 mg by mouth 2 (two) times daily with a meal. 12/23/20 03/23/21  Elsie Stain, MD  moxifloxacin (VIGAMOX) 0.5 % ophthalmic  solution Apply 1 drop to eye 4 (four) times daily. 03/11/21   [provider]  simvastatin (ZOCOR) 10 MG tablet Take 10 mg by mouth daily. 09/28/18   [provider]  verapamil (CALAN-SR) 240 MG CR tablet Take 240 mg by mouth daily. 12/11/18   [provider]     Positive ROS: Otherwise negative  All other systems have been reviewed and were otherwise negative with the exception of those mentioned in the HPI and as above.  Physical Exam: Constitutional: Alert, well-appearing, no acute distress Ears: External ears without lesions or tenderness.  The left ear canal and left TM are clear.  The right ear canal is swollen with granulation tissue at the entrance of the ear canal as well as a lot of drainage within the ear canal that was cleaned with suction.  The TM was intact and clear otherwise.  The more medial portion of the ear canal was relatively clear with minimal swelling.  Most of the swelling was more lateral ear canal.  Including a small amount of granulation tissue that was cauterized using silver nitrate.  After cleaning the right ear canal with hydroperoxide I applied gentian violet, Ciprodex and CSF powder to the right ear canal. Nasal: External nose without lesions. . Clear nasal passages Oral: Lips and gums without lesions. Tongue and palate mucosa without lesions. Posterior oropharynx clear. Neck: No palpable adenopathy or masses Respiratory: Breathing comfortably  Skin: No facial/neck lesions or rash noted.  Cerumen impaction removal  Date/Time: 03/31/2021 5:13 PM Performed by: Rozetta Nunnery, MD Authorized by: Rozetta Nunnery, MD   Consent:    Consent obtained:  Verbal   Consent given by:  Patient   Risks discussed:  Pain and bleeding Procedure details:    Location:  R ear   Procedure type: suction   Post-procedure details:    Inspection:  TM intact and canal normal   Hearing quality:  Improved   Procedure completion:  Tolerated  well, no immediate complications Comments:     The right ear canal was cleaned with hydroperoxide and suction.  After cleaning the ear canal I applied some silver nitrate to some granulation tissue on the lateral portion of the ear canal and applied gentian violet, Ciprodex and CSF powder to the right ear.  Assessment: Right ear external otitis with a small amount of granulation tissue.  Plan: Placed him on Cipro 500 mg twice daily for 10 days and also Ciprodex otic suspension drops 4 to 5 drops in the right ear twice daily. He will follow-up in 1 week for recheck and cleaning the ear.   Radene Journey, MD   CC:

## 2021-03-31 NOTE — Telephone Encounter (Signed)
Pt called stating that he is still in a lot of pain. He is requesting to have the results of the culture he had done today if possible. Pt is requesting to have a call back asap. Please advise.

## 2021-04-01 NOTE — Telephone Encounter (Signed)
Culture was canceled 8 days ago.  Patient made aware.  States ears are better now.

## 2021-04-06 ENCOUNTER — Telehealth: Payer: Self-pay

## 2021-04-06 NOTE — Telephone Encounter (Signed)
Called to schedule AWV, patient hung the phone up on me.

## 2021-04-07 ENCOUNTER — Ambulatory Visit (INDEPENDENT_AMBULATORY_CARE_PROVIDER_SITE_OTHER): Payer: Medicare HMO | Admitting: Otolaryngology

## 2021-04-07 ENCOUNTER — Other Ambulatory Visit: Payer: Self-pay

## 2021-04-07 DIAGNOSIS — H60311 Diffuse otitis externa, right ear: Secondary | ICD-10-CM

## 2021-04-07 NOTE — Progress Notes (Signed)
HPI: Edward Butler is a 80 y.o. male who returns today for evaluation of right external otitis.  This is probably result of use of his hearing aids that are fitted hearing aids to the ear canals.  He likes the hearing aids and wants to continue to use them.  He has left his right hearing aid out since last visit.  The ear is feeling much better..  Past Medical History:  Diagnosis Date   Atrial fibrillation ([place])    Diabetes mellitus without complication ([place])    Hypertension    Stroke [facility])    Past Surgical History:  Procedure Laterality Date   skin cancer surgeries      Social History   Socioeconomic History   Marital status: Married    Spouse name: Not on file   Number of children: Not on file   Years of education: Not on file   Highest education level: Not on file  Occupational History   Not on file  Tobacco Use   Smoking status: Never   Smokeless tobacco: Never  Vaping Use   Vaping Use: Never used  Substance and Sexual Activity   Alcohol use: Not Currently   Drug use: Not Currently   Sexual activity: Not Currently  Other Topics Concern   Not on file  Social History Narrative   Not on file   Social Determinants of Health   Financial Resource Strain: Not on file  Food Insecurity: Not on file  Transportation Needs: Not on file  Physical Activity: Not on file  Stress: Not on file  Social Connections: Not on file   No family history on file. No Known Allergies Prior to Admission medications   Medication Sig Start Date End Date Taking? Authorizing Provider  Accu-Chek FastClix Lancets MISC Use to check blood sugar once daily. E11.9 02/26/21   Elsie Stain, MD  acetaminophen-codeine (TYLENOL #3) 300-30 MG tablet Take 1 tablet by mouth every 6 (six) hours as needed for moderate pain. 03/24/21   Charlott Rakes, MD  amoxicillin-clavulanate (AUGMENTIN) 875-125 MG tablet Take 1 tablet by mouth 2 (two) times daily. 03/24/21   Charlott Rakes, MD  apixaban (ELIQUIS) 5 MG  TABS tablet Take 5 mg by mouth 2 (two) times daily. 07/13/19   [provider]  Blood Glucose Monitoring Suppl (ACCU-CHEK GUIDE ME) w/Device KIT Use to check blood sugar once daily. E11.9 02/26/21   Elsie Stain, MD  cetirizine (ZYRTEC) 5 MG tablet Take 1 tablet (5 mg total) by mouth daily. 03/13/21   Jaynee Eagles, PA-C  cholecalciferol (VITAMIN D3) 25 MCG (1000 UNIT) tablet Take 2,000 Units by mouth daily.    [provider]  ciprofloxacin (CIPRO) 500 MG tablet Take 1 tablet (500 mg total) by mouth 2 (two) times daily for 10 days. 03/31/21 04/10/21  Rozetta Nunnery, MD  ciprofloxacin-dexamethasone (CIPRODEX) OTIC suspension Place 4 drops into the right ear 2 (two) times daily. 03/26/21   Charlott Rakes, MD  ciprofloxacin-dexamethasone (CIPRODEX) OTIC suspension Place 4 drops into the right ear 2 (two) times daily. 4 drops in the right ear twice daily starting tomorrow morning 03/31/21   Rozetta Nunnery, MD  DHA-EPA-Flaxseed Oil-Vitamin E (THERA TEARS NUTRITION PO) Take 3 drops by mouth daily.    [provider]  DUREZOL 0.05 % EMUL Place 1 drop into the left eye 4 (four) times daily. 03/11/21   [provider]  enalapril (VASOTEC) 5 MG tablet Take 5 mg by mouth daily. 09/28/18   [provider]  finasteride (PROSCAR) 5 MG tablet Take 1 tablet (5 mg total) by mouth daily. 02/10/21   Elsie Stain, MD  fluticasone (FLONASE) 50 MCG/ACT nasal spray Place 2 sprays into both nostrils daily as needed.    [provider]  glucose blood (ACCU-CHEK GUIDE) test strip Use to check blood sugar once daily. E11.9 02/26/21   Elsie Stain, MD  HYDROcodone-acetaminophen (NORCO/VICODIN) 5-325 MG tablet Take 1 tablet by mouth every 6 (six) hours as needed for severe pain. 03/21/21   Jaynee Eagles, PA-C  levothyroxine (SYNTHROID) 25 MCG tablet Take 25 mcg by mouth daily before breakfast. 12/03/18   [provider]  magnesium oxide (MAG-OX) 400 MG  tablet Take by mouth daily. 12/13/18   [provider]  metFORMIN (GLUCOPHAGE-XR) 500 MG 24 hr tablet Take 4 tablets (2,000 mg total) by mouth daily. Patient taking differently: Take 1,000 mg by mouth 2 (two) times daily with a meal. 12/23/20 03/23/21  Elsie Stain, MD  moxifloxacin (VIGAMOX) 0.5 % ophthalmic solution Apply 1 drop to eye 4 (four) times daily. 03/11/21   [provider]  simvastatin (ZOCOR) 10 MG tablet Take 10 mg by mouth daily. 09/28/18   [provider]  verapamil (CALAN-SR) 240 MG CR tablet Take 240 mg by mouth daily. 12/11/18   [provider]     Positive ROS: Otherwise negative  All other systems have been reviewed and were otherwise negative with the exception of those mentioned in the HPI and as above.  Physical Exam: Constitutional: Alert, well-appearing, no acute distress Ears: External ears without lesions or tenderness.  Right ear canal appears much better although he still has a little bit of granulation tissue.  The more medial portion of the ear canal and the TM are clear. Nasal: External nose without lesions.. Clear nasal passages Oral: Lips and gums without lesions. Tongue and palate mucosa without lesions. Posterior oropharynx clear. Neck: No palpable adenopathy or masses.  No surrounding auricle pain or inflammation or adenopathy. Respiratory: Breathing comfortably  Skin: No facial/neck lesions or rash noted.  Procedures  Assessment: Resolving right external otitis.  Plan: Recommended completing the remaining antibiotics and using the Ciprodex eardrops 4 to 5 drops twice daily until the pain is completely resolved.  He can then go back to using his hearing aid. He will follow-up for recheck if he has any worsening of his pain or persistent problems beyond 1 week.   Radene Journey, MD

## 2021-04-30 DIAGNOSIS — G4733 Obstructive sleep apnea (adult) (pediatric): Secondary | ICD-10-CM | POA: Diagnosis not present

## 2021-06-06 ENCOUNTER — Other Ambulatory Visit: Payer: Self-pay | Admitting: Critical Care Medicine

## 2021-06-06 NOTE — Telephone Encounter (Signed)
Requested Prescriptions  Pending Prescriptions Disp Refills  . metFORMIN (GLUCOPHAGE-XR) 500 MG 24 hr tablet [Pharmacy Med Name: METFORMIN HYDROCHLORIDE ER 500 MG Tablet Extended Release 24 Hour]      Sig: Take 2 tablets (1,000 mg total) by mouth 2 (two) times daily with a meal.     Endocrinology:  Diabetes - Biguanides Passed - 06/06/2021  3:38 AM      Passed - Cr in normal range and within 360 days    Creatinine, Ser  Date Value Ref Range Status  12/26/2020 1.05 0.76 - 1.27 mg/dL Final         Passed - HBA1C is between 0 and 7.9 and within 180 days    HbA1c, POC (controlled diabetic range)  Date Value Ref Range Status  02/10/2021 6.3 0.0 - 7.0 % Final         Passed - eGFR in normal range and within 360 days    eGFR  Date Value Ref Range Status  12/26/2020 71 >59 mL/min/1.73 Final         Passed - Valid encounter within last 6 months    Recent Outpatient Visits          2 months ago Non-recurrent acute suppurative otitis media of right ear with spontaneous rupture of tympanic membrane   [facility] Newlin, [place], MD   3 months ago Type 2 diabetes mellitus without complication, without long-term current use of insulin (HCC)   [facility] Wright, Patrick E, MD   4 months ago Type 2 diabetes mellitus without complication, without long-term current use of insulin (HCC)   [facility] [place], Stephen L, RPH-CPP   5 months ago Type 2 diabetes mellitus without complication, without long-term current use of insulin (HCC)   [facility] [place], Stephen L, RPH-CPP   6 months ago Type 2 diabetes mellitus without complication, without long-term current use of insulin (HCC)   [facility] Wright, Patrick E, MD      Future Appointments            In 2 weeks Wright, Patrick E, MD [facility]             

## 2021-06-23 ENCOUNTER — Ambulatory Visit: Payer: Medicare HMO | Admitting: Critical Care Medicine

## 2021-07-01 DIAGNOSIS — Z20822 Contact with and (suspected) exposure to covid-19: Secondary | ICD-10-CM | POA: Diagnosis not present

## 2021-07-01 DIAGNOSIS — R0989 Other specified symptoms and signs involving the circulatory and respiratory systems: Secondary | ICD-10-CM | POA: Diagnosis not present

## 2021-07-01 DIAGNOSIS — H6692 Otitis media, unspecified, left ear: Secondary | ICD-10-CM | POA: Diagnosis not present

## 2021-07-01 DIAGNOSIS — R059 Cough, unspecified: Secondary | ICD-10-CM | POA: Diagnosis not present

## 2021-07-02 NOTE — Progress Notes (Signed)
Established Patient Office Visit  Subjective:  Patient ID: Edward Butler, male    DOB: 1941-06-02  Age: 80 y.o. MRN: [id number]  CC:  Chief Complaint  Patient presents with   Diabetes    HPI Edward Butler presents for primary care  follow-up visit.  Note this patient had an episode about a week ago of falling while assisting his daughter at her home.  He landed on concrete and had abrasions on the nose forehead right lower extremity.  He treated these with first-aid at home did not seek attention.  Note he is on Eliquis.  He is denied any headaches nausea or vomiting since that time.  He was coming down with upper respiratory tract infection at that time and this progressed to the point where he was seen 2 days ago at urgent care and given a prescription for amoxicillin.  They felt he had right ear infection and early sinus infection.  On arrival blood pressure 119/82 hemoglobin A1c is 6.1 although his blood sugar on arrival was 249.  He brings a carefully produce record of all of his blood sugars and including what meals he ate and what type of food he was eating.  It is important note that for the last week his postprandial blood sugars have been in the 200+ range whereas prior to this they had only been in the 1 60-1 80 range.  Fasting sugars had been in the 08/23/1928 range but now more recent in the 1 40-1 60 range.   The patient is already had his flu vaccine for the season he has no other complaints   Past Medical History:  Diagnosis Date   Atrial fibrillation ([place])    Diabetes mellitus without complication ([place])    Hypertension    Stroke [facility])     Past Surgical History:  Procedure Laterality Date   skin cancer surgeries       History reviewed. No pertinent family history.  Social History   Socioeconomic History   Marital status: Married    Spouse name: Not on file   Number of children: Not on file   Years of education: Not on file   Highest education level: Not on  file  Occupational History   Not on file  Tobacco Use   Smoking status: Never   Smokeless tobacco: Never  Vaping Use   Vaping Use: Never used  Substance and Sexual Activity   Alcohol use: Not Currently   Drug use: Not Currently   Sexual activity: Not Currently  Other Topics Concern   Not on file  Social History Narrative   Not on file   Social Determinants of Health   Financial Resource Strain: Not on file  Food Insecurity: Not on file  Transportation Needs: Not on file  Physical Activity: Not on file  Stress: Not on file  Social Connections: Not on file  Intimate Partner Violence: Not on file    Outpatient Medications Prior to Visit  Medication Sig Dispense Refill   Accu-Chek FastClix Lancets MISC Use to check blood sugar once daily. E11.9 100 each 2   amoxicillin (AMOXIL) 500 MG capsule Take by mouth.     apixaban (ELIQUIS) 5 MG TABS tablet Take 5 mg by mouth 2 (two) times daily.     Blood Glucose Monitoring Suppl (ACCU-CHEK GUIDE ME) w/Device KIT Use to check blood sugar once daily. E11.9 1 kit 0   cetirizine (ZYRTEC) 5 MG tablet Take 1 tablet (5 mg total) by mouth daily.  30 tablet 0   cholecalciferol (VITAMIN D3) 25 MCG (1000 UNIT) tablet Take 2,000 Units by mouth daily.     DHA-EPA-Flaxseed Oil-Vitamin E (THERA TEARS NUTRITION PO) Take 3 drops by mouth daily.     enalapril (VASOTEC) 5 MG tablet Take 5 mg by mouth daily.     finasteride (PROSCAR) 5 MG tablet Take 1 tablet (5 mg total) by mouth daily. 90 tablet 2   fluticasone (FLONASE) 50 MCG/ACT nasal spray Place 2 sprays into both nostrils daily as needed.     glucose blood (ACCU-CHEK GUIDE) test strip Use to check blood sugar once daily. E11.9 100 each 2   levothyroxine (SYNTHROID) 25 MCG tablet Take 25 mcg by mouth daily before breakfast.     magnesium oxide (MAG-OX) 400 MG tablet Take by mouth daily.     metFORMIN (GLUCOPHAGE-XR) 500 MG 24 hr tablet Take 2 tablets (1,000 mg total) by mouth 2 (two) times daily with  a meal. 360 tablet 0   simvastatin (ZOCOR) 10 MG tablet Take 10 mg by mouth daily.     verapamil (CALAN-SR) 240 MG CR tablet Take 240 mg by mouth daily.     amoxicillin-clavulanate (AUGMENTIN) 875-125 MG tablet Take 1 tablet by mouth 2 (two) times daily. 20 tablet 0   DUREZOL 0.05 % EMUL Place 1 drop into the left eye 4 (four) times daily.     Calcium Carbonate-Vitamin D (OYSTER SHELL CALCIUM/D) 500-5 MG-MCG TABS Take by mouth.     acetaminophen-codeine (TYLENOL #3) 300-30 MG tablet Take 1 tablet by mouth every 6 (six) hours as needed for moderate pain. (Patient not taking: Reported on 07/03/2021) 20 tablet 0   ciprofloxacin-dexamethasone (CIPRODEX) OTIC suspension Place 4 drops into the right ear 2 (two) times daily. (Patient not taking: Reported on 07/03/2021) 7.5 mL 0   ciprofloxacin-dexamethasone (CIPRODEX) OTIC suspension Place 4 drops into the right ear 2 (two) times daily. 4 drops in the right ear twice daily starting tomorrow morning (Patient not taking: Reported on 07/03/2021) 7.5 mL 0   HYDROcodone-acetaminophen (NORCO/VICODIN) 5-325 MG tablet Take 1 tablet by mouth every 6 (six) hours as needed for severe pain. (Patient not taking: Reported on 07/03/2021) 10 tablet 0   moxifloxacin (VIGAMOX) 0.5 % ophthalmic solution Apply 1 drop to eye 4 (four) times daily. (Patient not taking: Reported on 07/03/2021)     No facility-administered medications prior to visit.    Allergies  Allergen Reactions   Levonorgestrel-Ethinyl Estrad Other (See Comments)    Sneezing, eyes watering, nose running Sneezing, eyes watering, nose running    ROS Review of Systems  Constitutional:  Negative for chills, diaphoresis and fever.  HENT:  Positive for congestion, ear pain and rhinorrhea. Negative for hearing loss, nosebleeds, sinus pressure, sinus pain, sneezing, sore throat, tinnitus and trouble swallowing.   Eyes:  Negative for photophobia and redness.  Respiratory:  Positive for cough and wheezing.  Negative for shortness of breath and stridor.        Mucus yellow  Cardiovascular:  Negative for chest pain, palpitations and leg swelling.  Gastrointestinal:  Negative for abdominal pain, blood in stool, constipation, diarrhea, nausea and vomiting.  Endocrine: Negative for polydipsia and polyuria.  Genitourinary: Negative.  Negative for dysuria, flank pain, frequency, hematuria and urgency.  Musculoskeletal:  Negative for back pain, myalgias and neck pain.  Skin:  Positive for wound. Negative for rash.  Allergic/Immunologic: Negative for environmental allergies.  Neurological:  Positive for headaches. Negative for dizziness, tremors, seizures, syncope, facial asymmetry,  speech difficulty, weakness, light-headedness and numbness.  Hematological:  Does not bruise/bleed easily.  Psychiatric/Behavioral:  Negative for suicidal ideas. The patient is not nervous/anxious.      Objective:    Physical Exam Vitals reviewed.  Constitutional:      Appearance: Normal appearance. He is well-developed. He is not diaphoretic.  HENT:     Head: Normocephalic and atraumatic.     Right Ear: Tympanic membrane, ear canal and external ear normal. There is no impacted cerumen.     Left Ear: Tympanic membrane, ear canal and external ear normal. There is no impacted cerumen.     Nose: Congestion and rhinorrhea present. No nasal deformity, septal deviation or mucosal edema.     Right Sinus: No maxillary sinus tenderness or frontal sinus tenderness.     Left Sinus: No maxillary sinus tenderness or frontal sinus tenderness.     Mouth/Throat:     Mouth: Mucous membranes are moist.     Pharynx: Oropharynx is clear. No oropharyngeal exudate.  Eyes:     General: No scleral icterus.    Conjunctiva/sclera: Conjunctivae normal.     Pupils: Pupils are equal, round, and reactive to light.  Neck:     Thyroid: No thyromegaly.     Vascular: No carotid bruit or JVD.     Trachea: Trachea normal. No tracheal tenderness or  tracheal deviation.  Cardiovascular:     Rate and Rhythm: Normal rate and regular rhythm.     Chest Wall: PMI is not displaced.     Pulses: Normal pulses. No decreased pulses.     Heart sounds: Normal heart sounds, S1 normal and S2 normal. Heart sounds not distant. No murmur heard. No systolic murmur is present.  No diastolic murmur is present.    No friction rub. No gallop. No S3 or S4 sounds.  Pulmonary:     Effort: Pulmonary effort is normal. No tachypnea, accessory muscle usage or respiratory distress.     Breath sounds: Normal breath sounds. No stridor. No decreased breath sounds, wheezing, rhonchi or rales.  Chest:     Chest wall: No tenderness.  Abdominal:     General: Bowel sounds are normal. There is no distension.     Palpations: Abdomen is soft. Abdomen is not rigid.     Tenderness: There is no abdominal tenderness. There is no guarding or rebound.  Musculoskeletal:        General: Normal range of motion.     Cervical back: Normal range of motion and neck supple. No edema, erythema or rigidity. No muscular tenderness. Normal range of motion.  Lymphadenopathy:     Head:     Right side of head: No submental or submandibular adenopathy.     Left side of head: No submental or submandibular adenopathy.     Cervical: No cervical adenopathy.  Skin:    General: Skin is warm and dry.     Coloration: Skin is not pale.     Findings: No rash.     Nails: There is no clubbing.     Comments: Superficial abrasions over the left thigh left cheek right lower chin and right lower extremity left arm  Neurological:     Mental Status: He is alert and oriented to person, place, and time.     Sensory: No sensory deficit.  Psychiatric:        Mood and Affect: Mood normal.        Speech: Speech normal.  Behavior: Behavior normal.        Thought Content: Thought content normal.        Judgment: Judgment normal.    BP 119/82   Pulse 79   Resp 16   Wt 161 lb 3.2 oz (73.1 kg)   SpO2  98%   BMI 26.02 kg/m  Wt Readings from Last 3 Encounters:  07/03/21 161 lb 3.2 oz (73.1 kg)  03/24/21 170 lb (77.1 kg)  02/10/21 171 lb (77.6 kg)     Health Maintenance Due  Topic Date Due   Zoster Vaccines- Shingrix (1 of 2) Never done   COVID-19 Vaccine (3 - Booster for Moderna series) 11/09/2020    There are no preventive care reminders to display for this patient.  Lab Results  Component Value Date   TSH 2.800 12/05/2020   Lab Results  Component Value Date   WBC 6.7 12/26/2020   HGB 16.8 12/26/2020   HCT 48.4 12/26/2020   MCV 90 12/26/2020   PLT 153 12/26/2020   Lab Results  Component Value Date   NA 141 12/26/2020   K 4.6 12/26/2020   CO2 19 (L) 12/26/2020   GLUCOSE 206 (H) 12/26/2020   BUN 17 12/26/2020   CREATININE 1.05 12/26/2020   BILITOT 0.8 12/26/2020   ALKPHOS 52 12/26/2020   AST 19 12/26/2020   ALT 16 12/26/2020   PROT 7.1 12/26/2020   ALBUMIN 4.5 12/26/2020   CALCIUM 9.5 12/26/2020   EGFR 71 12/26/2020   Lab Results  Component Value Date   CHOL 141 12/05/2020   Lab Results  Component Value Date   HDL 43 12/05/2020   Lab Results  Component Value Date   LDLCALC 69 12/05/2020   Lab Results  Component Value Date   TRIG 171 (H) 12/05/2020   Lab Results  Component Value Date   CHOLHDL 3.3 12/05/2020   Lab Results  Component Value Date   HGBA1C 6.1 07/03/2021      Assessment & Plan:   Problem List Items Addressed This Visit       Cardiovascular and Mediastinum   Benign essential HTN    Hypertension currently stable at this time  No change in medications refills given      PAF (paroxysmal atrial fibrillation) ([place])    Patient currently in sinus rhythm no changes will observe stay on Eliquis        Endocrine   Type 2 diabetes mellitus without complication, without long-term current use of insulin ([place]) - Primary    Patient is on metformin alone patient is at goal A1c is 6.1  Recent elevation of blood sugars likely due  to dietary nonadherence and recent infection patient advised to avoid sugar sweetened beverages and concentrated sweets in his diet as much as possible      Relevant Orders   POCT glucose (manual entry) (Completed)   POCT glycosylated hemoglobin (Hb A1C) (Completed)   Hypothyroidism    Continue thyroid medications        Nervous and Auditory   Non-recurrent acute suppurative otitis media of right ear without spontaneous rupture of tympanic membrane    Acute otitis media right ear now improving continue current antibiotics call if symptoms recur      Relevant Medications   amoxicillin (AMOXIL) 500 MG capsule    No orders of the defined types were placed in this encounter.   Follow-up: Return in about 4 months (around 11/01/2021).    Asencion Noble, MD

## 2021-07-03 ENCOUNTER — Other Ambulatory Visit: Payer: Self-pay

## 2021-07-03 ENCOUNTER — Encounter: Payer: Self-pay | Admitting: Critical Care Medicine

## 2021-07-03 ENCOUNTER — Ambulatory Visit: Payer: Medicare HMO | Attending: Critical Care Medicine | Admitting: Critical Care Medicine

## 2021-07-03 VITALS — BP 119/82 | HR 79 | Resp 16 | Wt 161.2 lb

## 2021-07-03 DIAGNOSIS — I10 Essential (primary) hypertension: Secondary | ICD-10-CM | POA: Diagnosis not present

## 2021-07-03 DIAGNOSIS — H66001 Acute suppurative otitis media without spontaneous rupture of ear drum, right ear: Secondary | ICD-10-CM

## 2021-07-03 DIAGNOSIS — E119 Type 2 diabetes mellitus without complications: Secondary | ICD-10-CM

## 2021-07-03 DIAGNOSIS — E039 Hypothyroidism, unspecified: Secondary | ICD-10-CM

## 2021-07-03 DIAGNOSIS — I480 Paroxysmal atrial fibrillation: Secondary | ICD-10-CM | POA: Diagnosis not present

## 2021-07-03 LAB — POCT GLYCOSYLATED HEMOGLOBIN (HGB A1C): HbA1c, POC (controlled diabetic range): 6.1 % (ref 0.0–7.0)

## 2021-07-03 LAB — GLUCOSE, POCT (MANUAL RESULT ENTRY): POC Glucose: 249 mg/dl — AB (ref 70–99)

## 2021-07-03 NOTE — Patient Instructions (Signed)
No change in medications  Contact us if your cough is not improved after current course of antibiotics  Return to see Dr. Joya Gaskins 4 months

## 2021-07-04 ENCOUNTER — Encounter: Payer: Self-pay | Admitting: Critical Care Medicine

## 2021-07-04 DIAGNOSIS — H66001 Acute suppurative otitis media without spontaneous rupture of ear drum, right ear: Secondary | ICD-10-CM | POA: Insufficient documentation

## 2021-07-04 HISTORY — DX: Acute suppurative otitis media without spontaneous rupture of ear drum, right ear: H66.001

## 2021-07-04 NOTE — Assessment & Plan Note (Signed)
Patient is on metformin alone patient is at goal A1c is 6.1  Recent elevation of blood sugars likely due to dietary nonadherence and recent infection patient advised to avoid sugar sweetened beverages and concentrated sweets in his diet as much as possible

## 2021-07-04 NOTE — Assessment & Plan Note (Signed)
Patient currently in sinus rhythm no changes will observe stay on Eliquis

## 2021-07-04 NOTE — Assessment & Plan Note (Signed)
Continue thyroid medications

## 2021-07-04 NOTE — Assessment & Plan Note (Signed)
Acute otitis media right ear now improving continue current antibiotics call if symptoms recur

## 2021-07-04 NOTE — Assessment & Plan Note (Signed)
Hypertension currently stable at this time  No change in medications refills given

## 2021-07-07 DIAGNOSIS — L570 Actinic keratosis: Secondary | ICD-10-CM | POA: Diagnosis not present

## 2021-07-07 DIAGNOSIS — Z85828 Personal history of other malignant neoplasm of skin: Secondary | ICD-10-CM | POA: Diagnosis not present

## 2021-07-10 DIAGNOSIS — I350 Nonrheumatic aortic (valve) stenosis: Secondary | ICD-10-CM | POA: Diagnosis not present

## 2021-07-10 DIAGNOSIS — Z7901 Long term (current) use of anticoagulants: Secondary | ICD-10-CM | POA: Diagnosis not present

## 2021-07-10 DIAGNOSIS — I480 Paroxysmal atrial fibrillation: Secondary | ICD-10-CM | POA: Diagnosis not present

## 2021-07-10 DIAGNOSIS — E785 Hyperlipidemia, unspecified: Secondary | ICD-10-CM | POA: Diagnosis not present

## 2021-07-10 DIAGNOSIS — I10 Essential (primary) hypertension: Secondary | ICD-10-CM | POA: Diagnosis not present

## 2021-09-11 DIAGNOSIS — Z85828 Personal history of other malignant neoplasm of skin: Secondary | ICD-10-CM | POA: Diagnosis not present

## 2021-09-11 DIAGNOSIS — D485 Neoplasm of uncertain behavior of skin: Secondary | ICD-10-CM | POA: Diagnosis not present

## 2021-09-19 ENCOUNTER — Other Ambulatory Visit: Payer: Self-pay | Admitting: Critical Care Medicine

## 2021-10-14 DIAGNOSIS — Z1283 Encounter for screening for malignant neoplasm of skin: Secondary | ICD-10-CM | POA: Diagnosis not present

## 2021-10-14 DIAGNOSIS — L570 Actinic keratosis: Secondary | ICD-10-CM | POA: Diagnosis not present

## 2021-10-14 DIAGNOSIS — Z85828 Personal history of other malignant neoplasm of skin: Secondary | ICD-10-CM | POA: Diagnosis not present

## 2021-10-20 ENCOUNTER — Encounter (HOSPITAL_COMMUNITY): Payer: Self-pay

## 2021-10-20 ENCOUNTER — Observation Stay (HOSPITAL_COMMUNITY)
Admission: EM | Admit: 2021-10-20 | Discharge: 2021-10-21 | Disposition: A | Discharge status: Home / Self Care | Payer: Medicare HMO | Attending: Family Medicine | Admitting: Family Medicine

## 2021-10-20 ENCOUNTER — Emergency Department (HOSPITAL_COMMUNITY): Payer: Medicare HMO

## 2021-10-20 ENCOUNTER — Encounter: Payer: Self-pay | Admitting: Emergency Medicine

## 2021-10-20 ENCOUNTER — Ambulatory Visit
Admission: EM | Admit: 2021-10-20 | Discharge: 2021-10-20 | Disposition: A | Discharge status: Home / Self Care | Payer: Medicare HMO | Attending: Internal Medicine | Admitting: Internal Medicine

## 2021-10-20 ENCOUNTER — Other Ambulatory Visit: Payer: Self-pay

## 2021-10-20 DIAGNOSIS — R051 Acute cough: Secondary | ICD-10-CM

## 2021-10-20 DIAGNOSIS — G4733 Obstructive sleep apnea (adult) (pediatric): Secondary | ICD-10-CM | POA: Diagnosis not present

## 2021-10-20 DIAGNOSIS — Z9989 Dependence on other enabling machines and devices: Secondary | ICD-10-CM | POA: Insufficient documentation

## 2021-10-20 DIAGNOSIS — Z7901 Long term (current) use of anticoagulants: Secondary | ICD-10-CM | POA: Diagnosis not present

## 2021-10-20 DIAGNOSIS — Z8673 Personal history of transient ischemic attack (TIA), and cerebral infarction without residual deficits: Secondary | ICD-10-CM | POA: Insufficient documentation

## 2021-10-20 DIAGNOSIS — N400 Enlarged prostate without lower urinary tract symptoms: Secondary | ICD-10-CM | POA: Diagnosis present

## 2021-10-20 DIAGNOSIS — Z20822 Contact with and (suspected) exposure to covid-19: Secondary | ICD-10-CM | POA: Diagnosis not present

## 2021-10-20 DIAGNOSIS — E119 Type 2 diabetes mellitus without complications: Secondary | ICD-10-CM

## 2021-10-20 DIAGNOSIS — Z79890 Hormone replacement therapy (postmenopausal): Secondary | ICD-10-CM | POA: Diagnosis not present

## 2021-10-20 DIAGNOSIS — E785 Hyperlipidemia, unspecified: Secondary | ICD-10-CM | POA: Diagnosis present

## 2021-10-20 DIAGNOSIS — J90 Pleural effusion, not elsewhere classified: Secondary | ICD-10-CM | POA: Diagnosis not present

## 2021-10-20 DIAGNOSIS — I482 Chronic atrial fibrillation, unspecified: Secondary | ICD-10-CM | POA: Diagnosis not present

## 2021-10-20 DIAGNOSIS — R509 Fever, unspecified: Secondary | ICD-10-CM | POA: Insufficient documentation

## 2021-10-20 DIAGNOSIS — E860 Dehydration: Secondary | ICD-10-CM | POA: Diagnosis not present

## 2021-10-20 DIAGNOSIS — I4891 Unspecified atrial fibrillation: Secondary | ICD-10-CM | POA: Insufficient documentation

## 2021-10-20 DIAGNOSIS — Z7984 Long term (current) use of oral hypoglycemic drugs: Secondary | ICD-10-CM | POA: Insufficient documentation

## 2021-10-20 DIAGNOSIS — I4892 Unspecified atrial flutter: Secondary | ICD-10-CM | POA: Insufficient documentation

## 2021-10-20 DIAGNOSIS — Z79899 Other long term (current) drug therapy: Secondary | ICD-10-CM | POA: Insufficient documentation

## 2021-10-20 DIAGNOSIS — R000 Tachycardia, unspecified: Secondary | ICD-10-CM | POA: Diagnosis not present

## 2021-10-20 DIAGNOSIS — R918 Other nonspecific abnormal finding of lung field: Secondary | ICD-10-CM | POA: Insufficient documentation

## 2021-10-20 DIAGNOSIS — J189 Pneumonia, unspecified organism: Secondary | ICD-10-CM | POA: Diagnosis not present

## 2021-10-20 DIAGNOSIS — I480 Paroxysmal atrial fibrillation: Secondary | ICD-10-CM | POA: Diagnosis not present

## 2021-10-20 DIAGNOSIS — I350 Nonrheumatic aortic (valve) stenosis: Secondary | ICD-10-CM

## 2021-10-20 DIAGNOSIS — I10 Essential (primary) hypertension: Secondary | ICD-10-CM | POA: Diagnosis not present

## 2021-10-20 DIAGNOSIS — E039 Hypothyroidism, unspecified: Secondary | ICD-10-CM | POA: Diagnosis present

## 2021-10-20 DIAGNOSIS — I517 Cardiomegaly: Secondary | ICD-10-CM | POA: Diagnosis not present

## 2021-10-20 DIAGNOSIS — I4510 Unspecified right bundle-branch block: Secondary | ICD-10-CM | POA: Diagnosis not present

## 2021-10-20 DIAGNOSIS — J984 Other disorders of lung: Secondary | ICD-10-CM | POA: Insufficient documentation

## 2021-10-20 DIAGNOSIS — R059 Cough, unspecified: Secondary | ICD-10-CM | POA: Diagnosis not present

## 2021-10-20 HISTORY — DX: Pneumonia, unspecified organism: J18.9

## 2021-10-20 HISTORY — DX: Aneurysm of unspecified site: I72.9

## 2021-10-20 LAB — CBC WITH DIFFERENTIAL/PLATELET
Abs Immature Granulocytes: 0.01 10*3/uL (ref 0.00–0.07)
Basophils Absolute: 0 10*3/uL (ref 0.0–0.1)
Basophils Relative: 0 %
Eosinophils Absolute: 0.1 10*3/uL (ref 0.0–0.5)
Eosinophils Relative: 2 %
HCT: 45.3 % (ref 39.0–52.0)
Hemoglobin: 15.4 g/dL (ref 13.0–17.0)
Immature Granulocytes: 0 %
Lymphocytes Relative: 12 %
Lymphs Abs: 0.8 10*3/uL (ref 0.7–4.0)
MCH: 30.6 pg (ref 26.0–34.0)
MCHC: 34 g/dL (ref 30.0–36.0)
MCV: 89.9 fL (ref 80.0–100.0)
Monocytes Absolute: 0.6 10*3/uL (ref 0.1–1.0)
Monocytes Relative: 9 %
Neutro Abs: 4.7 10*3/uL (ref 1.7–7.7)
Neutrophils Relative %: 77 %
Platelets: 153 10*3/uL (ref 150–400)
RBC: 5.04 MIL/uL (ref 4.22–5.81)
RDW: 13.8 % (ref 11.5–15.5)
WBC: 6.2 10*3/uL (ref 4.0–10.5)
nRBC: 0 % (ref 0.0–0.2)

## 2021-10-20 LAB — COMPREHENSIVE METABOLIC PANEL
ALT: 12 U/L (ref 0–44)
AST: 19 U/L (ref 15–41)
Albumin: 3.3 g/dL — ABNORMAL LOW (ref 3.5–5.0)
Alkaline Phosphatase: 47 U/L (ref 38–126)
Anion gap: 10 (ref 5–15)
BUN: 17 mg/dL (ref 8–23)
CO2: 20 mmol/L — ABNORMAL LOW (ref 22–32)
Calcium: 8.5 mg/dL — ABNORMAL LOW (ref 8.9–10.3)
Chloride: 108 mmol/L (ref 98–111)
Creatinine, Ser: 0.98 mg/dL (ref 0.61–1.24)
GFR, Estimated: >60 mL/min (ref >60)
Glucose, Bld: 142 mg/dL — ABNORMAL HIGH (ref 70–99)
Potassium: 3.8 mmol/L (ref 3.5–5.1)
Sodium: 138 mmol/L (ref 135–145)
Total Bilirubin: 1.4 mg/dL — ABNORMAL HIGH (ref 0.3–1.2)
Total Protein: 6.2 g/dL — ABNORMAL LOW (ref 6.5–8.1)

## 2021-10-20 LAB — PROCALCITONIN: Procalcitonin: <0.1 ng/mL

## 2021-10-20 LAB — RESP PANEL BY RT-PCR (FLU A&B, COVID) ARPGX2
Influenza A by PCR: NEGATIVE
Influenza B by PCR: NEGATIVE
SARS Coronavirus 2 by RT PCR: NEGATIVE

## 2021-10-20 LAB — LACTIC ACID, PLASMA
Lactic Acid, Venous: 1.9 mmol/L (ref 0.5–1.9)
Lactic Acid, Venous: 1.6 mmol/L (ref 0.5–1.9)

## 2021-10-20 LAB — TSH: TSH: 3.031 u[IU]/mL (ref 0.350–4.500)

## 2021-10-20 LAB — GLUCOSE, CAPILLARY: Glucose-Capillary: 175 mg/dL — ABNORMAL HIGH (ref 70–99)

## 2021-10-20 MED ORDER — SODIUM CHLORIDE 0.9 % IV SOLN
2.0000 g | INTRAVENOUS | Status: DC
  Filled 2021-10-20: qty 20

## 2021-10-20 MED ORDER — SIMVASTATIN 20 MG PO TABS
10.0000 mg | ORAL_TABLET | Freq: Every day | ORAL | Status: DC
  Administered 2021-10-21: 10 mg via ORAL
  Filled 2021-10-20: qty 1

## 2021-10-20 MED ORDER — DILTIAZEM HCL-DEXTROSE 125-5 MG/125ML-% IV SOLN (PREMIX)
5.0000 mg/h | INTRAVENOUS | Status: DC
  Administered 2021-10-20: 5 mg/h via INTRAVENOUS
  Filled 2021-10-20 (×2): qty 125

## 2021-10-20 MED ORDER — AZITHROMYCIN 250 MG PO TABS
500.0000 mg | ORAL_TABLET | Freq: Once | ORAL | Status: AC
  Administered 2021-10-20: 500 mg via ORAL
  Filled 2021-10-20: qty 2

## 2021-10-20 MED ORDER — ACETAMINOPHEN 325 MG PO TABS: 650.0000 mg | ORAL_TABLET | Freq: Four times a day (QID) | ORAL | Status: DC | PRN

## 2021-10-20 MED ORDER — METOPROLOL TARTRATE 25 MG PO TABS
25.0000 mg | ORAL_TABLET | Freq: Two times a day (BID) | ORAL | Status: DC
  Administered 2021-10-20 – 2021-10-21 (×2): 25 mg via ORAL
  Filled 2021-10-20 (×2): qty 1

## 2021-10-20 MED ORDER — SODIUM CHLORIDE 0.9 % IV SOLN: 250.0000 mL | INTRAVENOUS | Status: DC | PRN

## 2021-10-20 MED ORDER — SODIUM CHLORIDE 0.9% FLUSH
3.0000 mL | Freq: Two times a day (BID) | INTRAVENOUS | Status: DC
  Administered 2021-10-21: 3 mL via INTRAVENOUS

## 2021-10-20 MED ORDER — ACETAMINOPHEN 500 MG PO TABS
1000.0000 mg | ORAL_TABLET | Freq: Once | ORAL | Status: AC
  Administered 2021-10-20: 1000 mg via ORAL
  Filled 2021-10-20: qty 2

## 2021-10-20 MED ORDER — GUAIFENESIN ER 600 MG PO TB12
600.0000 mg | ORAL_TABLET | Freq: Two times a day (BID) | ORAL | Status: DC
  Administered 2021-10-20 – 2021-10-21 (×2): 600 mg via ORAL
  Filled 2021-10-20 (×2): qty 1

## 2021-10-20 MED ORDER — APIXABAN 5 MG PO TABS
5.0000 mg | ORAL_TABLET | Freq: Two times a day (BID) | ORAL | Status: DC
  Administered 2021-10-21: 5 mg via ORAL
  Filled 2021-10-20 (×2): qty 1

## 2021-10-20 MED ORDER — AZITHROMYCIN 250 MG PO TABS
250.0000 mg | ORAL_TABLET | Freq: Every day | ORAL | Status: DC
  Administered 2021-10-21: 250 mg via ORAL
  Filled 2021-10-20: qty 1

## 2021-10-20 MED ORDER — SODIUM CHLORIDE 0.9 % IV SOLN
1.0000 g | Freq: Once | INTRAVENOUS | Status: AC
  Administered 2021-10-20: 1 g via INTRAVENOUS
  Filled 2021-10-20: qty 10

## 2021-10-20 MED ORDER — SODIUM CHLORIDE 0.9% FLUSH: 3.0000 mL | INTRAVENOUS | Status: DC | PRN

## 2021-10-20 MED ORDER — DILTIAZEM HCL 25 MG/5ML IV SOLN
10.0000 mg | Freq: Once | INTRAVENOUS | Status: AC
  Administered 2021-10-20: 10 mg via INTRAVENOUS
  Filled 2021-10-20: qty 5

## 2021-10-20 MED ORDER — SODIUM CHLORIDE 0.9 % IV BOLUS
500.0000 mL | Freq: Once | INTRAVENOUS | Status: AC
  Administered 2021-10-20: 500 mL via INTRAVENOUS

## 2021-10-20 MED ORDER — FINASTERIDE 5 MG PO TABS
5.0000 mg | ORAL_TABLET | Freq: Every day | ORAL | Status: DC
  Administered 2021-10-21: 5 mg via ORAL
  Filled 2021-10-20: qty 1

## 2021-10-20 MED ORDER — LEVOTHYROXINE SODIUM 25 MCG PO TABS
25.0000 ug | ORAL_TABLET | Freq: Every day | ORAL | Status: DC
  Administered 2021-10-21: 25 ug via ORAL
  Filled 2021-10-20: qty 1

## 2021-10-20 MED ORDER — MAGNESIUM OXIDE -MG SUPPLEMENT 400 (240 MG) MG PO TABS
400.0000 mg | ORAL_TABLET | Freq: Every day | ORAL | Status: DC
Start: 2021-10-21 — End: 2021-10-21
  Administered 2021-10-21: 400 mg via ORAL
  Filled 2021-10-20 (×2): qty 1

## 2021-10-20 MED ORDER — DILTIAZEM LOAD VIA INFUSION: 10.0000 mg | Freq: Once | INTRAVENOUS | Status: DC

## 2021-10-20 MED ORDER — INSULIN ASPART 100 UNIT/ML IJ SOLN
0.0000 [IU] | Freq: Three times a day (TID) | INTRAMUSCULAR | Status: DC
  Administered 2021-10-21: 3 [IU] via SUBCUTANEOUS
  Administered 2021-10-21: 2 [IU] via SUBCUTANEOUS

## 2021-10-20 MED ORDER — ACETAMINOPHEN 650 MG RE SUPP: 650.0000 mg | Freq: Four times a day (QID) | RECTAL | Status: DC | PRN

## 2021-10-20 NOTE — ED Provider Notes (Signed)
?Physical Exam  ?BP (!) 112/91   Pulse (!) 48   Temp 99.6 ?F (37.6 ?C) (Oral)   Resp 20   Ht '5\' 6"'$  (1.676 m)   Wt 72.6 kg   SpO2 98%   BMI 25.82 kg/m?  ? ?Physical Exam ? ?Procedures  ?Marland KitchenCritical Care ?Performed by: Emeline Darling, PA-C ?Authorized by: Emeline Darling, PA-C  ? ?Critical care provider statement:  ?  Critical care time (minutes):  45 ?  Critical care was time spent personally by me on the following activities:  Development of treatment plan with patient or surrogate, discussions with consultants, evaluation of patient's response to treatment, examination of patient, obtaining history from patient or surrogate, ordering and performing treatments and interventions, ordering and review of laboratory studies, ordering and review of radiographic studies, pulse oximetry and re-evaluation of patient's condition ? ?ED Course / MDM  ? ?Clinical Course as of 10/20/21 1807  ?Mon Oct 20, 2021  ?1500 Rectal temp 102.1 [HS]  ?1601 Consult to cardiology coordinator Trish who states that the cardiology providers will see this patient in consultation; recommends medical admission.  I appreciate her collaboration in the care of this patient. [RS]  ?1641 Patient reevaluated significant improvement in his on diltiazem drip, now 110 on the monitor, remains in atrial fibrillation.  Asymptomatic at this time. [RS]  ?[place] to Dr. Rogers Blocker, hospitalist, who is agreeable to seeing this patient and admitting him to her service.  I appreciate her collaboration in the care of this patient. [RS]  ?  ?Clinical Course User Index ?[HS] Sherrill Raring, PA-C ?[RS] Keora Eccleston, Gypsy Balsam, PA-C  ? ?Medical Decision Making ?Amount and/or Complexity of Data Reviewed ?Labs: ordered. ?Radiology: ordered. ? ?Risk ?OTC drugs. ?Prescription drug management. ?Decision regarding hospitalization. ? ? ?Care of this patient assumed from ED provider Sherrill Raring, PA-C at time of shift change. Please see her associated note for  further insight into this patient's ED course.  In brief this is an 81 year old gentlemanWith history of hypertension, dyspnea, type 2 diabetes, atrial fibrillation anticoagulated on Eliquis and rate controled with verapamil, and aortic stenosis with LVEF of 55 to 60%  Who presented to urgent care today with concern for cough for 4 days as well as fever with Tmax 104 ?F treated with ibuprofen at home. ? ?During initial ED work-up patient was found to be tachycardic to the 140s.  Work-up revealed CBC without leukocytosis or anemia.  CMP with normal renal function and mildly elevated total bilirubin 1.4.  Chest x-ray revealed asymmetric left greater than right perihilar densities concerning for pneumonia versus edema.  In context of patient's fevers at home favor diagnosis of pneumonia.  Will treat with antibiosis for CAP coverage.  EKG also noted atrial fibrillation with rapid ventricular response with heart rate in the 140s intake.  At time of my initial evaluation of the patient at bedside his heart rate is in the 160s.  Has not been administered any medication in the emergency department for rate control up to this point.  We will proceed with diltiazem bolus and infusion. ? ?Discussion with the patient regarding appropriate disposition planning for his conditions.  Though he feels fairly well and has not been hypoxic in the emergency department, do feel he would benefit from admission to the hospital for concomitant A-fib with RVR and CAP. ? ?Mr. Gary voiced understanding of his medical evaluation and treatment plan.  Each of his questions was answered to his expressed satisfaction.  He is amenable  to plan for admission at this time.  ? ?Patient evaluated at the bedside by cardiology and admitted to hospital medicine service.  ? ?This chart was dictated using voice recognition software, Dragon. Despite the best efforts of this provider to proofread and correct errors, errors may still occur which can change  documentation meaning. ? ? ?  ?Emeline Darling, PA-C ?10/20/21 1807 ? ?  ?Valarie Merino, MD ?10/21/21 0009 ? ?

## 2021-10-20 NOTE — Consult Note (Signed)
?Cardiology Consultation:  ? ?Patient ID: Edward Butler ?MRN: [id number]; DOB: 11-24-1940 ? ?Admit date: 10/20/2021 ?Date of Consult: 10/20/2021 ? ?PCP:  Elsie Stain, MD ?  ?[facility] Providers ?Cardiologist: Dr. Dina Rich [facility] Dina Rich, MD    ?Click here to update MD or APP on Care Team, Refresh:1}   ? ? ?Patient Profile:  ? ?Edward Butler is a 81 y.o. male with a hx of diabetes aortic stenosis paroxysmal atrial fibrillation on Eliquis who is being seen 10/20/2021 for the evaluation of atrial fibrillation in the setting of fever at the request of Dr. Dene Gentry. ? ?History of Present Illness:  ? ?Edward Butler is an 81 year old male followed regularly by Dr.Bohle at [facility] with paroxysmal atrial fibrillation on Eliquis, hypertension, moderate aortic stenosis.  At his prior office visit on 07/10/2021 he had lost 12 pounds and corrected his hemoglobin A1c.  He was taking metformin.  He denied any chest pain shortness of breath palpitations syncope at that time.  He was walking 4-5 times weekly without any symptoms or problems. ? ?Never smoked. ?No early family history of coronary artery disease. ? ?His last echocardiogram demonstrated moderate aortic stenosis. ? ?Eliquis utilized for anticoagulation.  Enalapril 5 mg daily, simvastatin 10 mg daily, verapamil 240 mg SR daily utilized for hypertension as well as potential rate control. ? ?Today he presented to an urgent care with complaint of cough and fever for the past 4 days.  His maximum temperature was 104 at home.  He was trying Coricidin.  When he went to the urgent care for further evaluation was found to be tachycardic with atrial fibrillation.  ? ?No chest pain no shortness of breath.  Here maximum temperature was 101.2. ? ?White count 6.2 hemoglobin 15.4 platelets 153 sodium 138 potassium 3.8 bicarb 20 creatinine 0.98 lactic acid 1.9-normal.  Flu and COVID-negative.  Chest x-ray personally reviewed shows  right and possibly left perihilar density of questionable significance-see radiology report ? ?Diltiazem infusion was started to help with rate control. ? ?Past Medical History:  ?Diagnosis Date  ? Atrial fibrillation ([place])   ? Diabetes mellitus without complication ([place])   ? Hypertension   ? Stroke [facility])   ? ? ?Past Surgical History:  ?Procedure Laterality Date  ? skin cancer surgeries     ?  ? ?Home Medications:  ?Prior to Admission medications   ?Medication Sig Start Date End Date Taking? Authorizing Provider  ?Accu-Chek FastClix Lancets MISC Use to check blood sugar once daily. E11.9 02/26/21   Elsie Stain, MD  ?apixaban (ELIQUIS) 5 MG TABS tablet Take 5 mg by mouth 2 (two) times daily. 07/13/19   [provider]  ?Blood Glucose Monitoring Suppl (ACCU-CHEK GUIDE ME) w/Device KIT USE AS DIRECTED 09/19/21   Elsie Stain, MD  ?Calcium Carbonate-Vitamin D (OYSTER SHELL CALCIUM/D) 500-5 MG-MCG TABS Take by mouth.    [provider]  ?cetirizine (ZYRTEC) 5 MG tablet Take 1 tablet (5 mg total) by mouth daily. 03/13/21   Jaynee Eagles, PA-C  ?cholecalciferol (VITAMIN D3) 25 MCG (1000 UNIT) tablet Take 2,000 Units by mouth daily.    [provider]  ?DHA-EPA-Flaxseed Oil-Vitamin E (THERA TEARS NUTRITION PO) Take 3 drops by mouth daily.    [provider]  ?enalapril (VASOTEC) 5 MG tablet Take 5 mg by mouth daily. 09/28/18   [provider]  ?finasteride (PROSCAR) 5 MG tablet TAKE 1 TABLET EVERY DAY 09/19/21   Elsie Stain,  MD  ?fluorouracil (EFUDEX) 5 % cream Apply 1 application. topically 3 (three) times a week. 07/07/21   [provider]  ?fluticasone (FLONASE) 50 MCG/ACT nasal spray Place 2 sprays into both nostrils daily as needed.    [provider]  ?glucose blood (ACCU-CHEK GUIDE) test strip USE TO CHECK BLOOD SUGAR ONCE DAILY. 09/19/21   Elsie Stain, MD  ?levothyroxine (SYNTHROID) 25 MCG tablet Take 25 mcg by mouth daily before breakfast.  12/03/18   [provider]  ?magnesium oxide (MAG-OX) 400 MG tablet Take by mouth daily. 12/13/18   [provider]  ?metFORMIN (GLUCOPHAGE-XR) 500 MG 24 hr tablet Take 2 tablets (1,000 mg total) by mouth 2 (two) times daily. 09/19/21   Elsie Stain, MD  ?simvastatin (ZOCOR) 10 MG tablet Take 10 mg by mouth daily. 09/28/18   [provider]  ?verapamil (CALAN-SR) 240 MG CR tablet Take 240 mg by mouth daily. 12/11/18   [provider]  ? ? ?Inpatient Medications: ?Scheduled Meds: ? ?Continuous Infusions: ? diltiazem (CARDIZEM) infusion 10 mg/hr (10/20/21 1651)  ? ?PRN Meds: ? ? ?Allergies:    ?Allergies  ?Allergen Reactions  ? Levonorgestrel-Ethinyl Estrad Other (See Comments)  ?  Sneezing, eyes watering, nose running ?Sneezing, eyes watering, nose running  ? ? ?Social History:   ?Social History  ? ?Socioeconomic History  ? Marital status: Married  ?  Spouse name: Not on file  ? Number of children: Not on file  ? Years of education: Not on file  ? Highest education level: Not on file  ?Occupational History  ? Not on file  ?Tobacco Use  ? Smoking status: Never  ? Smokeless tobacco: Never  ?Vaping Use  ? Vaping Use: Never used  ?Substance and Sexual Activity  ? Alcohol use: Not Currently  ? Drug use: Not Currently  ? Sexual activity: Not Currently  ?Other Topics Concern  ? Not on file  ?Social History Narrative  ? Not on file  ? ?Social Determinants of Health  ? ?Financial Resource Strain: Not on file  ?Food Insecurity: Not on file  ?Transportation Needs: Not on file  ?Physical Activity: Not on file  ?Stress: Not on file  ?Social Connections: Not on file  ?Intimate Partner Violence: Not on file  ?  ?Family History:   ?No family history on file.  No early family history of coronary artery disease ? ?ROS:  ?Please see the history of present illness.  ? ?All other ROS reviewed and negative.    ? ?Physical Exam/Data:  ? ?Vitals:  ? 10/20/21 1615 10/20/21 1645 10/20/21 1653 10/20/21 1708   ?BP:   122/83   ?Pulse: (!) 146 (!) 131 81   ?Resp: (!) 23 (!) 30 (!) 37   ?Temp:    99.6 ?F (37.6 ?C)  ?TempSrc:    Oral  ?SpO2: 99% 96% 97%   ?Weight:      ?Height:      ? ? ?Intake/Output Summary (Last 24 hours) at 10/20/2021 1718 ?Last data filed at 10/20/2021 1652 ?Gross per 24 hour  ?Intake 3.13 ml  ?Output --  ?Net 3.13 ml  ? ?Last 3 Weights 10/20/2021 07/03/2021 03/24/2021  ?Weight (lbs) 160 lb 161 lb 3.2 oz 170 lb  ?Weight (kg) 72.576 kg 73.12 kg 77.111 kg  ?   ?Body mass index is 25.82 kg/m?.  ?General:  Well nourished, well developed, in no acute distress ?HEENT: normal ?Neck: no JVD ?Vascular: No carotid bruits; Distal pulses 2+ bilaterally ?Cardiac:  normal S1, S2; tachycardic irregularly irregular; 2/6 systolic murmur ?Lungs:  clear to auscultation bilaterally, no wheezing, rhonchi or rales  ?Abd: soft, nontender, no hepatomegaly, protuberant ?Ext: no edema ?Musculoskeletal:  No deformities, BUE and BLE strength normal and equal ?Skin: Mildly diaphoretic back, outer extremities are cool ?Neuro:  CNs 2-12 intact, no focal abnormalities noted ?Psych:  Normal affect  ? ?EKG:  The EKG was personally reviewed and demonstrates: Atrial fibrillation heart rate 135 bpm with right bundle branch block personally reviewed and interpreted ?Telemetry:  Telemetry was personally reviewed and demonstrates: Atrial fibrillation with heart rates as high as 140 ? ?Relevant CV Studies: ? ?Echocardiogram 03/27/2021: ? ?SUMMARY  ?The left ventricular size is normal.  ?Mild left ventricular hypertrophy  ?LV ejection fraction = 55-60%.  ?Left ventricular systolic function is normal.  ?The right ventricle is normal in size and function.  ?The left atrium is moderately dilated.  ?There is moderate to severe aortic stenosis.  ?There is moderate to severe mitral annular calcification.  ?There is mild mitral regurgitation.  ?The mean gradient across the mitral valve is 3.9 mmHg.  ?The heart rate for the mean mitral valve gradient is 70  BPM.  ?There was insufficient TR detected to calculate RV systolic pressure.  ?The inferior vena cava was not visualized during the exam.  ?There is no pericardial effusion.  ?There is no comparison stud

## 2021-10-20 NOTE — Assessment & Plan Note (Addendum)
TSH wnl. ?Continue home synthroid   ?

## 2021-10-20 NOTE — ED Triage Notes (Signed)
Pt bib [facility] from [facility] with complaints of a cough and fever. Upon arrival to UC pt HR found to be in in ST at 130, pt given 573m NS  and converted to NSR 90. Pt went back into ST at 140 and another 500 ml NS given en route.  ?BP 120SBP ?Pt arrives denying pain, AOx4, VSS ?

## 2021-10-20 NOTE — ED Triage Notes (Signed)
Cough, fever up to 104 at home, x 4 days. Took 2 corisedens this morning. HR 139 in triage. Hx of afib ?

## 2021-10-20 NOTE — Assessment & Plan Note (Addendum)
Presumptive diagnosis with CXR opacity, acute productive cough and fever. Viral work up negative. Urine antigen studies negative thus far.  ?- Sputum culture pending, blood cultures NGTD. Will monitor after discharge.  ?- Complete typical antibiotic course and repeat CXR at follow up to ensure resolution. Pt also has no subacute/chronic systemic symptoms of malignancy. Discussed he may need CT chest if still opacity at follow up. ?

## 2021-10-20 NOTE — ED Notes (Signed)
RN unable to take report at this time 

## 2021-10-20 NOTE — ED Notes (Signed)
Cards paged. Secure message sent as well.  ?

## 2021-10-20 NOTE — Assessment & Plan Note (Addendum)
BP normotensive, continue enalapril, new metoprolol, and home diltiazem CR ?

## 2021-10-20 NOTE — ED Notes (Signed)
ED TO INPATIENT HANDOFF REPORT ? ?ED Nurse Name and Phone #:  Kae Heller [phone] ? ?S ?Name/Age/Gender ?Edward Butler ?81 y.o. ?male ?Room/Bed: 029C/029C ? ?Code Status ?  Code Status: Full Code ? ?Home/SNF/Other ?Home ?Patient oriented to: self, place, time, and situation ?Is this baseline? Yes  ? ?Triage Complete: Triage complete  ?Chief Complaint ?Paroxysmal atrial fibrillation with RVR ([place]) [I48.0] ? ?Triage Note ?Pt [facility] with complaints of a cough and fever. Upon arrival to UC pt HR found to be in in ST at 130, pt given 542m NS  and converted to NSR 90. Pt went back into ST at 140 and another 500 ml NS given en route.  ?BP 120SBP ?Pt arrives denying pain, AOx4, VSS  ? ?Allergies ?Allergies  ?Allergen Reactions  ? Levonorgestrel-Ethinyl Estrad Other (See Comments)  ?  Sneezing, eyes watering, nose running ?Sneezing, eyes watering, nose running  ? ? ?Level of Care/Admitting Diagnosis ?ED Disposition   ? ? ED Disposition  ?Admit  ? Condition  ?--  ? Comment  ?[id number]? Level of Care: Progressive [102] ? Admit to Progressive based on following criteria: CARDIOVASCULAR & THORACIC of moderate stability with acute coronary syndrome symptoms/low risk myocardial infarction/hypertensive urgency/arrhythmias/heart failure potentially compromising stability and stable post cardiovascular intervention patients. ? [facility]:: Yes ? Covid Evaluation: Confirmed COVID Negative ? Diagnosis: Parox[place]mal [place] with RVR ([place] [[[id number]]? Admitting Physician: WOrma Flaming[[[id number]? Attending Physician: WOrma Flaming[[[id number]?  ?  ? ?  ? ? ?B ?Medical/Surgery History ?Past Medical History:  ?Diagnosis Date  ? [place]   ? [place] complication [place]   ? Hypertension   ? Stroke ([facility]   ? ?Past Surgical History:  ?Procedure Laterality  Date  ? skin cancer surgeries     ?  ? ?A ?IV Location/Drains/Wounds ?Patient Lines/Drains/Airways Status   ? ? Active Line/Drains/Airways   ? ? Name Placement date Placement time Site Days  ? Peripheral IV 10/20/21 20 G Anterior;Distal;Left;Upper Arm 10/20/21  1400  Arm  less than 1  ? ?  ?  ? ?  ? ? ?Intake/Output Last 24 hours ? ?Intake/Output Summary (Last 24 hours) at 10/20/2021 1844 ?Last data filed at 10/20/2021 1805 ?Gross per 24 hour  ?Intake 15.31 ml  ?Output --  ?Net 15.31 ml  ? ? ?Labs/Imaging ?Results for orders placed or performed during the hospital encounter of 10/20/21 (from the past 48 hour(s))  ?CBC with Differential     Status: None  ? Collection Time: 10/20/21  2:00 PM  ?Result Value Ref Range  ? WBC 6.2 4.0 - 10.5 K/uL  ? RBC 5.04 4.22 - 5.81 MIL/uL  ? Hemoglobin 15.4 13.0 - 17.0 g/dL  ? HCT 45.3 39.0 - 52.0 %  ? MCV 89.9 80.0 - 100.0 fL  ? MCH 30.6 26.0 - 34.0 pg  ? MCHC 34.0 30.0 - 36.0 g/dL  ? RDW 13.8 11.5 - 15.5 %  ? Platelets 153 150 - 400 K/uL  ? nRBC 0.0 0.0 - 0.2 %  ? Neutrophils Relative % 77 %  ? Neutro Abs 4.7 1.7 - 7.7 K/uL  ? Lymphocytes Relative 12 %  ? Lymphs Abs 0.8 0.7 - 4.0 K/uL  ? Monocytes Relative 9 %  ? Monocytes Absolute 0.6 0.1 - 1.0 K/uL  ? Eosinophils Relative 2 %  ?  Eosinophils Absolute 0.1 0.0 - 0.5 K/uL  ? Basophils Relative 0 %  ? Basophils Absolute 0.0 0.0 - 0.1 K/uL  ? Immature Granulocytes 0 %  ? Abs Immature Granulocytes 0.01 0.00 - 0.07 K/uL  ?  Comment: Performed at [facility], [place] [address] [place], [place] [address]  ?Lactic acid, plasma     Status: None  ? Collection Time: 10/20/21  2:00 PM  ?Result Value Ref Range  ? Lactic Acid, Venous 1.9 0.5 - 1.9 mmol/L  ?  Comment: Performed at [facility], [place] [address] [place], [place] [address]  ?Comprehensive metabolic panel     Status: Abnormal  ? Collection Time: 10/20/21  2:06 PM  ?Result Value Ref Range  ? Sodium 138 135 - 145 mmol/L  ? Potassium 3.8 3.5 - 5.1 mmol/L  ? Chloride 108 98 - 111  mmol/L  ? CO2 20 (L) 22 - 32 mmol/L  ? Glucose, Bld 142 (H) 70 - 99 mg/dL  ?  Comment: Glucose reference range applies only to samples taken after fasting for at least 8 hours.  ? BUN 17 8 - 23 mg/dL  ? Creatinine, Ser 0.98 0.61 - 1.24 mg/dL  ? Calcium 8.5 (L) 8.9 - 10.3 mg/dL  ? Total Protein 6.2 (L) 6.5 - 8.1 g/dL  ? Albumin 3.3 (L) 3.5 - 5.0 g/dL  ? AST 19 15 - 41 U/L  ? ALT 12 0 - 44 U/L  ? Alkaline Phosphatase 47 38 - 126 U/L  ? Total Bilirubin 1.4 (H) 0.3 - 1.2 mg/dL  ? GFR, Estimated >60 >60 mL/min  ?  Comment: (NOTE) ?Calculated using the CKD-EPI Creatinine Equation (2021) ?  ? Anion gap 10 5 - 15  ?  Comment: Performed at [facility], [place] [address] [place], [place] [address]  ?Resp Panel by RT-PCR (Flu A&B, Covid) Nasopharyngeal Swab     Status: None  ? Collection Time: 10/20/21  2:54 PM  ? Specimen: Nasopharyngeal Swab; Nasopharyngeal(NP) swabs in vial transport medium  ?Result Value Ref Range  ? SARS Coronavirus 2 by RT PCR NEGATIVE NEGATIVE  ?  Comment: (NOTE) ?SARS-CoV-2 target nucleic acids are NOT DETECTED. ? ?The SARS-CoV-2 RNA is generally detectable in upper respiratory ?specimens during the acute phase of infection. The lowest ?concentration of SARS-CoV-2 viral copies this assay can detect is ?138 copies/mL. A negative result does not preclude SARS-Cov-2 ?infection and should not be used as the sole basis for treatment or ?other patient management decisions. A negative result may occur with  ?improper specimen collection/handling, submission of specimen other ?than nasopharyngeal swab, presence of viral mutation(s) within the ?areas targeted by this assay, and inadequate number of viral ?copies(<138 copies/mL). A negative result must be combined with ?clinical observations, patient history, and epidemiological ?information. The expected result is Negative. ? ?Fact Sheet for Patients:  ?EntrepreneurPulse.com.au ? ?Fact Sheet for Healthcare Providers:   ?IncredibleEmployment.be ? ?This test is no t yet approved or cleared by the [place] FDA and  ?has been authorized for detection and/or diagnosis of SARS-CoV-2 by ?FDA under an Emergency Use Authorization (EUA). This EUA will remain  ?in effect (meaning this test can be used) for the duration of the ?COVID-19 declaration under Section 564(b)(1) of the Act, 21 ?U.S.C.section 360bbb-3(b)(1), unless the authorization is terminated  ?or revoked sooner.  ? ? ?  ? Influenza A by PCR NEGATIVE NEGATIVE  ? Influenza B by PCR NEGATIVE NEGATIVE  ?  Comment: (NOTE) ?  The Xpert Xpress SARS-CoV-2/FLU/RSV plus assay is intended as an aid ?in the diagnosis of influenza from Nasopharyngeal swab specimens and ?should not be used as a sole basis for treatment. Nasal washings and ?aspirates are unacceptable for Xpert Xpress SARS-CoV-2/FLU/RSV ?testing. ? ?Fact Sheet for Patients: ?EntrepreneurPulse.com.au ? ?Fact Sheet for Healthcare Providers: ?IncredibleEmployment.be ? ?This test is not yet approved or cleared by the [place] FDA and ?has been authorized for detection and/or diagnosis of SARS-CoV-2 by ?FDA under an Emergency Use Authorization (EUA). This EUA will remain ?in effect (meaning this test can be used) for the duration of the ?COVID-19 declaration under Section 564(b)(1) of the Act, 21 U.S.C. ?section 360bbb-3(b)(1), unless the authorization is terminated or ?revoked. ? ?Performed at [facility], [place] [address], [place], [place] ?[address] ?  ? ?DG Chest Portable 1 View ? ?Result Date: 10/20/2021 ?CLINICAL DATA:  Cough and tachycardia. EXAM: PORTABLE CHEST 1 VIEW COMPARISON:  09/04/2020 FINDINGS: Midline trachea. Mild cardiomegaly. Left and possible right perihilar increased density. No pleural effusion or pneumothorax. IMPRESSION: Left and possible right perihilar increased density. This could represent asymmetric pulmonary edema or left-sided pneumonia.  However, an underlying left hilar mass or adenopathy is a concern. If the patient has symptoms which could be attributed to pulmonary edema or pneumonia, appropriate therapy and short-term radiographic follow-up could be

## 2021-10-20 NOTE — Assessment & Plan Note (Addendum)
RVR is asymptomatic, precipitated by pneumonia. Converted to NSR on diltiazem infusion. Restarted home diltiazem per cardiology who also added metoprolol tartrate '25mg'$  po BID.  ?- Continue eliquis, hasn't missed doses ?

## 2021-10-20 NOTE — Progress Notes (Signed)
RT set up CPAP at patient's bedside and adjusted mask to fit patient. Patient stated he would place himself on when ready. RT instructed pt to have RT called if assistance is needed. RT will monitor as needed. ?

## 2021-10-20 NOTE — ED Notes (Signed)
Attempt to call [phone] x2 no reply. ?

## 2021-10-20 NOTE — H&P (Addendum)
?History and Physical  ? ? ?Patient: Edward Butler XTK:[id number] DOB: Aug 02, 1941 ?DOA: 10/20/2021 ?DOS: the patient was seen and examined on 10/20/2021 ?PCP: Elsie Stain, MD  ?Patient coming from: Urgent care  - lives with his daughter and son in law  ? ? ?Chief Complaint: fever and cough  ? ?HPI: Prathik Aman is a 81 y.o. male with medical history significant of T2DM, AS, PAF on eliquis, hx of CVA, hypothyroidism, HLD, OSA on cpap with complaints of cough and fever. He states his fever was 101-104. Fever started Saturday or Sunday. He also had a cough on Friday. Cough is productive in nature, especially in the AM. Sputum is light yellow. He has no chest pain or shortness of breath. He had one episode of vomiting on Saturday night.  He just felt bad the first few days, but feels much better now. No sick contacts. No travel.  ? ?He has been feeling good. Denies any vision changes/headaches, chest pain or palpitations, shortness of breath, abdominal pain, N/V/D, dysuria or leg swelling.  ? ?He does not smoke or drink  ? ?ER Course:  vitals: temp: 101.2, bp: 130/119, HR; 98, RR: 34, oxygen: 97%RA ?Pertinent labs:  ?None ?CXR: left and possible right perihilar increased density. Asymmetric pulmonary edema or left sided pneumonia. ? Underlying left hilar mass or adenopathy is a concern.  ?In ED: rocephin/zithromax and cardizem. Given 500cc bolus here and had 1L with EMS. Cardiology consulted.  ? ? ?Review of Systems: As mentioned in the history of present illness. All other systems reviewed and are negative. ?Past Medical History:  ?Diagnosis Date  ? Atrial fibrillation ([place])   ? Diabetes mellitus without complication ([place])   ? Hypertension   ? Stroke [facility])   ? ?Past Surgical History:  ?Procedure Laterality Date  ? skin cancer surgeries     ? ?Social History:  reports that he has never smoked. He has never used smokeless tobacco. He reports that he does not currently use alcohol. He reports that he does not currently  use drugs. ? ?Allergies  ?Allergen Reactions  ? Levonorgestrel-Ethinyl Estrad Other (See Comments)  ?  Sneezing, eyes watering, nose running ?Sneezing, eyes watering, nose running  ? ? ?No family history on file. ? ?Prior to Admission medications   ?Medication Sig Start Date End Date Taking? Authorizing Provider  ?Accu-Chek FastClix Lancets MISC Use to check blood sugar once daily. E11.9 02/26/21   Elsie Stain, MD  ?apixaban (ELIQUIS) 5 MG TABS tablet Take 5 mg by mouth 2 (two) times daily. 07/13/19   [provider]  ?Blood Glucose Monitoring Suppl (ACCU-CHEK GUIDE ME) w/Device KIT USE AS DIRECTED 09/19/21   Elsie Stain, MD  ?Calcium Carbonate-Vitamin D (OYSTER SHELL CALCIUM/D) 500-5 MG-MCG TABS Take by mouth.    [provider]  ?cetirizine (ZYRTEC) 5 MG tablet Take 1 tablet (5 mg total) by mouth daily. 03/13/21   Jaynee Eagles, PA-C  ?cholecalciferol (VITAMIN D3) 25 MCG (1000 UNIT) tablet Take 2,000 Units by mouth daily.    [provider]  ?DHA-EPA-Flaxseed Oil-Vitamin E (THERA TEARS NUTRITION PO) Take 3 drops by mouth daily.    [provider]  ?enalapril (VASOTEC) 5 MG tablet Take 5 mg by mouth daily. 09/28/18   [provider]  ?finasteride (PROSCAR) 5 MG tablet TAKE 1 TABLET EVERY DAY 09/19/21   Elsie Stain, MD  ?fluorouracil (EFUDEX) 5 % cream Apply 1 application. topically 3 (three) times a week. 07/07/21   [provider]  ?fluticasone (FLONASE) 50 MCG/ACT nasal spray Place 2 sprays into both nostrils daily as needed.    [provider]  ?glucose blood (ACCU-CHEK GUIDE) test strip USE TO CHECK BLOOD SUGAR ONCE DAILY. 09/19/21   Elsie Stain, MD  ?levothyroxine (SYNTHROID) 25 MCG tablet Take 25 mcg by mouth daily before breakfast. 12/03/18   [provider]  ?magnesium oxide (MAG-OX) 400 MG tablet Take by mouth daily. 12/13/18   [provider]  ?metFORMIN (GLUCOPHAGE-XR) 500 MG 24 hr tablet Take 2 tablets (1,000 mg  total) by mouth 2 (two) times daily. 09/19/21   Elsie Stain, MD  ?simvastatin (ZOCOR) 10 MG tablet Take 10 mg by mouth daily. 09/28/18   [provider]  ?verapamil (CALAN-SR) 240 MG CR tablet Take 240 mg by mouth daily. 12/11/18   [provider]  ? ? ?Physical Exam: ?Vitals:  ? 10/20/21 1915 10/20/21 1930 10/20/21 2000 10/20/21 2002  ?BP: 119/74 106/78 116/79   ?Pulse: 86 (!) 148 (!) 149   ?Resp: (!) 25 (!) 29 (!) 28   ?Temp:    98.7 ?F (37.1 ?C)  ?TempSrc:    Oral  ?SpO2: 96% 97% 97%   ?Weight:      ?Height:      ? ?General:  Appears calm and comfortable and is in NAD ?Eyes:  PERRL, EOMI, normal lids, iris ?ENT:  grossly normal hearing, lips & tongue, mmm; appropriate dentition ?Neck:  no LAD, masses or thyromegaly; no carotid bruits ?Cardiovascular: irrgularly, irregulary, +systolic murmur. No LE edema.  ?Respiratory:   CTA bilaterally with no wheezes/rales/rhonchi.  Normal respiratory effort. ?Abdomen:  soft, NT, ND, NABS ?Back:   normal alignment, no CVAT ?Skin:  no rash or induration seen on limited exam ?Musculoskeletal:  grossly normal tone BUE/BLE, good ROM, no bony abnormality ?Lower extremity:  No LE edema.  Limited foot exam with no ulcerations.  2+ distal pulses. ?Psychiatric:  grossly normal mood and affect, speech fluent and appropriate, AOx3 ?Neurologic:  CN 2-12 grossly intact, moves all extremities in coordinated fashion, sensation intact ? ? ?Radiological Exams on Admission: ?Independently reviewed - see discussion in A/P where applicable ? ?DG Chest Portable 1 View ? ?Result Date: 10/20/2021 ?CLINICAL DATA:  Cough and tachycardia. EXAM: PORTABLE CHEST 1 VIEW COMPARISON:  09/04/2020 FINDINGS: Midline trachea. Mild cardiomegaly. Left and possible right perihilar increased density. No pleural effusion or pneumothorax. IMPRESSION: Left and possible right perihilar increased density. This could represent asymmetric pulmonary edema or left-sided pneumonia. However, an underlying  left hilar mass or adenopathy is a concern. If the patient has symptoms which could be attributed to pulmonary edema or pneumonia, appropriate therapy and short-term radiographic follow-up could be performed. If not, recommend further evaluation with contrast enhanced chest CT. Electronically Signed   By: Abigail Miyamoto M.D.   On: 10/20/2021 14:53   ? ?EKG: Independently reviewed.  Atrial fibrillation with rate 135; nonspecific ST changes with no evidence of acute ischemia. RBBB, no previous ekg.  ? ? ?Labs on Admission: I have personally reviewed the available labs and imaging studies at the time of the admission. ? ?Pertinent labs:   ?None. No WBC, normal initial lactic acid.  ? ? ? ?Assessment and Plan: ?* Paroxysmal atrial fibrillation with RVR ([place]) ?81 year old male with history of PAF on eliquis presenting with URI type symptoms and found to be in afib with RVR with rates 140-160. Not hypoxic, asymptomatic and denies palpitations or shortness of breath.  ?Admit to  progressive on telemetry  ?Cardiology consulted in ED and Dr. Marlou Porch has seen.  ?Likely being driven from pneumonia  ?Continue cardizem infusion  ?Continue eliquis, has not missed any doses.  ? ? ?CAP (community acquired pneumonia) ?-Patient presenting with productive cough, fever to 101, productive cough, and infiltrate in left lower lobe on chest x-ray consistent with pneumonia.  ?- most likely community-acquired pneumonia.  ?-continue rocephin and azithromycin ?-check urine antigen studies  ?-blood cultures pending ?-Respiratory virus panel ordered to include pertussis ?-sputum cx  ?-check procalcitonin  ?-mucinex BID ?-did discuss with him that CXR read as questionable underlying left hilar mass or adenopathy concern for neoplasm. Will repeat 2V in the AM and may need chest CT tomorrow for further clarification.  ? ?Type 2 diabetes mellitus without complication, without long-term current use of insulin ([place]) ?Recent a1c 6.1 on 07/2021 ?Well  controlled ?Hold metformin ?SSI and accuchecks per protocol  ? ?Benign essential HTN ?Hold home meds while on cadizem gtt for now ? ?Hypothyroidism ?TSH wnl in 12/2020, will recheck today with RVR ?Continue

## 2021-10-20 NOTE — Assessment & Plan Note (Signed)
Moderate AS based off last echo from [place] note with his cardiologist.  ?

## 2021-10-20 NOTE — Assessment & Plan Note (Signed)
Continue zocor 

## 2021-10-20 NOTE — ED Provider Notes (Signed)
?[facility] ?Provider Note ? ? ?CSN: [id number] ?Arrival date & time: 10/20/21  1348 ? ?  ? ?History ? ?No chief complaint on file. ? ? ?Edward Butler is a 81 y.o. male. ? ?HPI ? ?Patient with medical history notable for diabetes, aortic stenosis, paroxysmal atrial fibrillation on Eliquis, hypertension, hyperlipidemia presenting due to cough and fever x4 days.  Tmax 104 at home, has been trying Coricidin with some relief.  He is not having any chest pain or shortness of breath, denies any missed doses of the Eliquis.  He was seen at urgent care and was found to be tachycardic, sent to ED for further work-up. ? ?Endorses cough and fever, specifically denies any chest pain, shortness of breath, abdominal pain, nausea, vomiting, missed doses of medicine, new medicines. ? ?Home Medications ?Prior to Admission medications   ?Medication Sig Start Date End Date Taking? Authorizing Provider  ?Accu-Chek FastClix Lancets MISC Use to check blood sugar once daily. E11.9 02/26/21   Elsie Stain, MD  ?apixaban (ELIQUIS) 5 MG TABS tablet Take 5 mg by mouth 2 (two) times daily. 07/13/19   [provider]  ?Blood Glucose Monitoring Suppl (ACCU-CHEK GUIDE ME) w/Device KIT USE AS DIRECTED 09/19/21   Elsie Stain, MD  ?Calcium Carbonate-Vitamin D (OYSTER SHELL CALCIUM/D) 500-5 MG-MCG TABS Take by mouth.    [provider]  ?cetirizine (ZYRTEC) 5 MG tablet Take 1 tablet (5 mg total) by mouth daily. 03/13/21   Jaynee Eagles, PA-C  ?cholecalciferol (VITAMIN D3) 25 MCG (1000 UNIT) tablet Take 2,000 Units by mouth daily.    [provider]  ?DHA-EPA-Flaxseed Oil-Vitamin E (THERA TEARS NUTRITION PO) Take 3 drops by mouth daily.    [provider]  ?enalapril (VASOTEC) 5 MG tablet Take 5 mg by mouth daily. 09/28/18   [provider]  ?finasteride (PROSCAR) 5 MG tablet TAKE 1 TABLET EVERY DAY 09/19/21   Elsie Stain, MD  ?fluorouracil (EFUDEX) 5 % cream  Apply 1 application. topically 3 (three) times a week. 07/07/21   [provider]  ?fluticasone (FLONASE) 50 MCG/ACT nasal spray Place 2 sprays into both nostrils daily as needed.    [provider]  ?glucose blood (ACCU-CHEK GUIDE) test strip USE TO CHECK BLOOD SUGAR ONCE DAILY. 09/19/21   Elsie Stain, MD  ?levothyroxine (SYNTHROID) 25 MCG tablet Take 25 mcg by mouth daily before breakfast. 12/03/18   [provider]  ?magnesium oxide (MAG-OX) 400 MG tablet Take by mouth daily. 12/13/18   [provider]  ?metFORMIN (GLUCOPHAGE-XR) 500 MG 24 hr tablet Take 2 tablets (1,000 mg total) by mouth 2 (two) times daily. 09/19/21   Elsie Stain, MD  ?simvastatin (ZOCOR) 10 MG tablet Take 10 mg by mouth daily. 09/28/18   [provider]  ?verapamil (CALAN-SR) 240 MG CR tablet Take 240 mg by mouth daily. 12/11/18   [provider]  ?   ? ?Allergies    ?Levonorgestrel-ethinyl estrad   ? ?Review of Systems   ?Review of Systems ? ?Physical Exam ?Updated Vital Signs ?BP (!) 130/119   Pulse 98   Temp (!) 101.2 ?F (38.4 ?C) (Rectal)   Resp (!) 34   Ht 5' 6"  (1.676 m)   Wt 72.6 kg   SpO2 97%   BMI 25.82 kg/m?  ?Physical Exam ?Vitals and nursing note reviewed. Exam conducted with a chaperone present.  ?Constitutional:   ?   Appearance: Normal appearance.  ?HENT:  ?  Head: Normocephalic and atraumatic.  ?Eyes:  ?   General: No scleral icterus.    ?   Right eye: No discharge.     ?   Left eye: No discharge.  ?   Extraocular Movements: Extraocular movements intact.  ?   Pupils: Pupils are equal, round, and reactive to light.  ?Cardiovascular:  ?   Rate and Rhythm: Tachycardia present. Rhythm irregular.  ?   Pulses: Normal pulses.  ?   Heart sounds: Normal heart sounds. No murmur heard. ?  No friction rub. No gallop.  ?Pulmonary:  ?   Effort: Pulmonary effort is normal. No respiratory distress.  ?   Breath sounds: Normal breath sounds.  ?   Comments: Lungs are clear to  auscultation bilaterally ?Abdominal:  ?   General: Abdomen is flat. Bowel sounds are normal. There is no distension.  ?   Palpations: Abdomen is soft.  ?   Tenderness: There is no abdominal tenderness.  ?Skin: ?   General: Skin is warm and dry.  ?   Coloration: Skin is not jaundiced.  ?Neurological:  ?   Mental Status: He is alert. Mental status is at baseline.  ?   Coordination: Coordination normal.  ? ? ?ED Results / Procedures / Treatments   ?Labs ?(all labs ordered are listed, but only abnormal results are displayed) ?Labs Reviewed  ?COMPREHENSIVE METABOLIC PANEL - Abnormal; Notable for the following components:  ?    Result Value  ? CO2 20 (*)   ? Glucose, Bld 142 (*)   ? Calcium 8.5 (*)   ? Total Protein 6.2 (*)   ? Albumin 3.3 (*)   ? Total Bilirubin 1.4 (*)   ? All other components within normal limits  ?RESP PANEL BY RT-PCR (FLU A&B, COVID) [id number]  ?CULTURE, BLOOD (ROUTINE X 2)  ?CULTURE, BLOOD (ROUTINE X 2)  ?CBC WITH DIFFERENTIAL/PLATELET  ?LACTIC ACID, PLASMA  ?LACTIC ACID, PLASMA  ? ? ?EKG ?EKG Interpretation ? ?Date/Time:  Monday October 20 2021 13:49:01 EDT ?Ventricular Rate:  135 ?PR Interval:    ?QRS Duration: 135 ?QT Interval:  357 ?QTC Calculation: 536 ?R Axis:   218 ?Text Interpretation: Atrial fibrillation with RVR Right bundle branch block No old tracing to compare Confirmed by Sherwood Gambler [phone]) on 10/20/2021 1:58:47 PM ? ?Radiology ?DG Chest Portable 1 View ? ?Result Date: 10/20/2021 ?CLINICAL DATA:  Cough and tachycardia. EXAM: PORTABLE CHEST 1 VIEW COMPARISON:  09/04/2020 FINDINGS: Midline trachea. Mild cardiomegaly. Left and possible right perihilar increased density. No pleural effusion or pneumothorax. IMPRESSION: Left and possible right perihilar increased density. This could represent asymmetric pulmonary edema or left-sided pneumonia. However, an underlying left hilar mass or adenopathy is a concern. If the patient has symptoms which could be attributed to pulmonary edema or  pneumonia, appropriate therapy and short-term radiographic follow-up could be performed. If not, recommend further evaluation with contrast enhanced chest CT. Electronically Signed   By: Abigail Miyamoto M.D.   On: 10/20/2021 14:53   ? ?Procedures ?Procedures  ? ? ?Medications Ordered in ED ?Medications  ?cefTRIAXone (ROCEPHIN) 1 g in sodium chloride 0.9 % 100 mL IVPB (has no administration in time range)  ?diltiazem (CARDIZEM) 1 mg/mL load via infusion 10 mg (has no administration in time range)  ?sodium chloride 0.9 % bolus 500 mL (500 mLs Intravenous New Bag/Given 10/20/21 1454)  ?acetaminophen (TYLENOL) tablet 1,000 mg (1,000 mg Oral Given 10/20/21 1450)  ? ? ?ED Course/ Medical Decision Making/ A&P ?Clinical Course as  of 10/20/21 1513  ?Mon Oct 20, 2021  ?1500 Rectal temp 102.1 [HS]  ?  ?Clinical Course User Index ?[HS] Sherrill Raring, PA-C  ? ?                        ?Medical Decision Making ?Amount and/or Complexity of Data Reviewed ?Labs: ordered. ?Radiology: ordered. ? ?Risk ?OTC drugs. ? ? ?This patient presents to the ED for concern of cough, fever and tachycardia, this involves an extensive number of treatment options, and is a complaint that carries with it a high risk of complications and morbidity.  The differential diagnosis includes AF with RVR, sepsis, PNA, COVID, other ? ?Patient?s presentation is complicated by their history of PAF resulting in chronic anticoagulation. ? ? ?Additional history obtained:  ? ?I reviewed PMH, patient is chronically coagulated on Eliquis.  I also reviewed the urgent care provider note prior to patient's arrival.  This noted that patient has been having 4 days of symptoms, was tachycardic at that time as well.   ? ?EMS was an independent historian, patient was given half liter fluid bolus in route. ? ? ?Lab Tests: ? ?I ordered, viewed, and personally interpreted labs.  The pertinent results include:   ? ?CBC does not show any underlying anemia, no leukocytosis. ?Lactic 1.9,  higher edge or normal  ?CMP without AKI or gross electrolyte derangement.   ?Blood culture pending. ? ?Imaging Studies ordered: ? ?I directly visualized the DG chest, which showed possible left lobe pneumonia ? ?I agree wi

## 2021-10-20 NOTE — Assessment & Plan Note (Addendum)
HbA1c 6.1% on 07/2021. Continue metformin. Also on ACEi and statin at home. ?

## 2021-10-20 NOTE — ED Notes (Signed)
HR returns to  140's. No change in patient's symptoms. MD paged.  ?

## 2021-10-20 NOTE — Discharge Instructions (Signed)
Patient sent to the hospital via EMS. 

## 2021-10-20 NOTE — ED Provider Notes (Signed)
?[facility] ? ? ? ?CSN: [id number] ?Arrival date & time: 10/20/21  1112 ? ? ?  ? ?History   ?Chief Complaint ?Chief Complaint  ?Patient presents with  ? Cough  ? Fever  ? ? ?HPI ?Edward Butler is a 81 y.o. male.  ? ?Patient presents with cough and fever that has been present for approximately 4 days.  Tmax at home was 104.  Patient denies any known sick contacts.  Denies any upper respiratory symptoms, chest pain, shortness of breath, sore throat, ear pain, nausea, vomiting, diarrhea, abdominal pain.  Patient has taken Coricidin with minimal improvement in symptoms.  Was called to triage by nursing staff given tachycardia present while taking vital signs.  Patient has a history of atrial fibrillation and takes Eliquis for this.  Denies any associated chest pain or shortness of breath.  Denies headache, dizziness, blurred vision.  Denies palpitations. ? ? ?Cough ?Fever ? ?Past Medical History:  ?Diagnosis Date  ? Atrial fibrillation ([place])   ? Diabetes mellitus without complication ([place])   ? Hypertension   ? Stroke [facility])   ? ? ?Patient Active Problem List  ? Diagnosis Date Noted  ? Non-recurrent acute suppurative otitis media of right ear without spontaneous rupture of tympanic membrane 07/04/2021  ? OSA on CPAP 12/05/2020  ? BPH (benign prostatic hyperplasia) 12/05/2020  ? Hypothyroidism 12/05/2020  ? 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 Dermatochalasis of both upper eyelids 07/23/2020  ? Myopia with astigmatism and presbyopia, bilateral 07/23/2020  ? Posterior vitreous detachment of both eyes 07/23/2020  ? Chronic anticoagulation 07/13/2019  ? PAF (paroxysmal atrial fibrillation) ([place]) 07/13/2019  ? Encounter for anticoagulation discussion and counseling 04/13/2019  ? Aortic stenosis, moderate 12/13/2018  ? Benign essential HTN 12/13/2018  ? Dyslipidemia 12/13/2018  ? Type 2 diabetes mellitus without complication,  without long-term current use of insulin ([place]) 12/13/2018  ? History of colonic polyps 11/13/2016  ? ? ?Past Surgical History:  ?Procedure Laterality Date  ? skin cancer surgeries     ? ? ? ? ? ?Home Medications   ? ?Prior to Admission medications   ?Medication Sig Start Date End Date Taking? Authorizing Provider  ?Accu-Chek FastClix Lancets MISC Use to check blood sugar once daily. E11.9 02/26/21   Elsie Stain, MD  ?apixaban (ELIQUIS) 5 MG TABS tablet Take 5 mg by mouth 2 (two) times daily. 07/13/19   [provider]  ?Blood Glucose Monitoring Suppl (ACCU-CHEK GUIDE ME) w/Device KIT USE AS DIRECTED 09/19/21   Elsie Stain, MD  ?Calcium Carbonate-Vitamin D (OYSTER SHELL CALCIUM/D) 500-5 MG-MCG TABS Take by mouth.    [provider]  ?cetirizine (ZYRTEC) 5 MG tablet Take 1 tablet (5 mg total) by mouth daily. 03/13/21   Jaynee Eagles, PA-C  ?cholecalciferol (VITAMIN D3) 25 MCG (1000 UNIT) tablet Take 2,000 Units by mouth daily.    [provider]  ?DHA-EPA-Flaxseed Oil-Vitamin E (THERA TEARS NUTRITION PO) Take 3 drops by mouth daily.    [provider]  ?enalapril (VASOTEC) 5 MG tablet Take 5 mg by mouth daily. 09/28/18   [provider]  ?finasteride (PROSCAR) 5 MG tablet TAKE 1 TABLET EVERY DAY 09/19/21   Elsie Stain, MD  ?fluticasone [facility]) 50 MCG/ACT nasal spray Place 2 sprays into both nostrils daily as needed.    [provider]  ?glucose blood (ACCU-CHEK GUIDE) test strip USE TO CHECK BLOOD SUGAR ONCE DAILY.  09/19/21   Elsie Stain, MD  ?levothyroxine (SYNTHROID) 25 MCG tablet Take 25 mcg by mouth daily before breakfast. 12/03/18   [provider]  ?magnesium oxide (MAG-OX) 400 MG tablet Take by mouth daily. 12/13/18   [provider]  ?metFORMIN (GLUCOPHAGE-XR) 500 MG 24 hr tablet Take 2 tablets (1,000 mg total) by mouth 2 (two) times daily. 09/19/21   Elsie Stain, MD  ?simvastatin (ZOCOR) 10 MG tablet Take 10 mg by  mouth daily. 09/28/18   [provider]  ?verapamil (CALAN-SR) 240 MG CR tablet Take 240 mg by mouth daily. 12/11/18   [provider]  ? ? ?Family History ?History reviewed. No pertinent family history. ? ?Social History ?Social History  ? ?Tobacco Use  ? Smoking status: Never  ? Smokeless tobacco: Never  ?Vaping Use  ? Vaping Use: Never used  ?Substance Use Topics  ? Alcohol use: Not Currently  ? Drug use: Not Currently  ? ? ? ?Allergies   ?Levonorgestrel-ethinyl estrad ? ? ?Review of Systems ?Review of Systems ?Per HPI ? ?Physical Exam ?Triage Vital Signs ?ED Triage Vitals  ?Enc Vitals Group  ?   BP 10/20/21 1216 (!) 136/104  ?   Pulse Rate 10/20/21 1216 (!) 139  ?   Resp 10/20/21 1216 16  ?   Temp 10/20/21 1216 98.2 ?F (36.8 ?C)  ?   Temp Source 10/20/21 1216 Oral  ?   SpO2 10/20/21 1216 97 %  ?   Weight --   ?   Height --   ?   Head Circumference --   ?   Peak Flow --   ?   Pain Score 10/20/21 1217 0  ?   Pain Loc --   ?   Pain Edu? --   ?   Excl. in [place]? --   ? ?No data found. ? ?Updated Vital Signs ?BP (!) 136/104 (BP Location: Left Arm)   Pulse (!) 139   Temp 98.2 ?F (36.8 ?C) (Oral)   Resp 16   SpO2 97%  ? ?Visual Acuity ?Right Eye Distance:   ?Left Eye Distance:   ?Bilateral Distance:   ? ?Right Eye Near:   ?Left Eye Near:    ?Bilateral Near:    ? ?Physical Exam ?Constitutional:   ?   General: He is not in acute distress. ?   Appearance: Normal appearance. He is not toxic-appearing or diaphoretic.  ?HENT:  ?   Head: Normocephalic and atraumatic.  ?Eyes:  ?   Extraocular Movements: Extraocular movements intact.  ?   Conjunctiva/sclera: Conjunctivae normal.  ?Cardiovascular:  ?   Rate and Rhythm: Normal rate and regular rhythm.  ?   Pulses: Normal pulses.  ?   Heart sounds: Normal heart sounds.  ?Pulmonary:  ?   Effort: Pulmonary effort is normal. No respiratory distress.  ?   Breath sounds: Normal breath sounds.  ?Neurological:  ?   General: No focal deficit present.  ?   Mental Status:  He is alert and oriented to person, place, and time. Mental status is at baseline.  ?Psychiatric:     ?   Mood and Affect: Mood normal.     ?   Behavior: Behavior normal.     ?   Thought Content: Thought content normal.     ?   Judgment: Judgment normal.  ? ? ? ?UC Treatments / Results  ?Labs ?(all labs ordered are listed, but only abnormal results are displayed) ?Labs Reviewed -  No data to display ? ?EKG ? ? ?Radiology ?No results found. ? ?Procedures ?Procedures (including critical care time) ? ?Medications Ordered in UC ?Medications - No data to display ? ?Initial Impression / Assessment and Plan / UC Course  ?I have reviewed the triage vital signs and the nursing notes. ? ?Pertinent labs & imaging results that were available during my care of the patient were reviewed by me and considered in my medical decision making (see chart for details). ? ?  ? ?EKG appears to be showing some type of sinus tachycardia.  No obvious atrial fibrillation on EKG.  Although, patient does have history of atrial fibrillation and tachycardia is present.  Do think that patient needs a more extensive evaluation and management given tachycardia in the setting of acute illness.  Patient was advised that he needs to go to the hospital for further evaluation and management.  Patient was agreeable with plan and left via EMS transport. ?Final Clinical Impressions(s) / UC Diagnoses  ? ?Final diagnoses:  ?Tachycardia  ?Acute cough  ? ? ? ?Discharge Instructions   ? ?  ?Patient sent to the hospital via EMS.  ? ? ? ?ED Prescriptions   ?None ?  ? ?PDMP not reviewed this encounter. ?  ?Teodora Medici, [place] ?10/20/21 1249 ? ?

## 2021-10-20 NOTE — Progress Notes (Addendum)
Pt arrived from ED paged Dr. Kalman Shan about pt's HR sustained at 135-138 still in A.Fib. Pt is Maxed out on Cardizem gtt. Pt is asymptomatic. VS stable. Awaiting call back. ? ?Order received to give pt Lopressor 25 mg PO. Will continue to monitor pt.  ?

## 2021-10-20 NOTE — Assessment & Plan Note (Signed)
Continue proscar  ?

## 2021-10-20 NOTE — Assessment & Plan Note (Signed)
On cpap 

## 2021-10-21 ENCOUNTER — Observation Stay (HOSPITAL_COMMUNITY): Payer: Medicare HMO

## 2021-10-21 ENCOUNTER — Encounter (HOSPITAL_COMMUNITY): Payer: Self-pay | Admitting: Family Medicine

## 2021-10-21 ENCOUNTER — Telehealth: Payer: Self-pay

## 2021-10-21 ENCOUNTER — Other Ambulatory Visit (HOSPITAL_COMMUNITY): Payer: Self-pay

## 2021-10-21 DIAGNOSIS — E039 Hypothyroidism, unspecified: Secondary | ICD-10-CM

## 2021-10-21 DIAGNOSIS — R509 Fever, unspecified: Secondary | ICD-10-CM | POA: Diagnosis not present

## 2021-10-21 DIAGNOSIS — E785 Hyperlipidemia, unspecified: Secondary | ICD-10-CM | POA: Diagnosis not present

## 2021-10-21 DIAGNOSIS — N400 Enlarged prostate without lower urinary tract symptoms: Secondary | ICD-10-CM | POA: Diagnosis not present

## 2021-10-21 DIAGNOSIS — I10 Essential (primary) hypertension: Secondary | ICD-10-CM

## 2021-10-21 DIAGNOSIS — Z9989 Dependence on other enabling machines and devices: Secondary | ICD-10-CM

## 2021-10-21 DIAGNOSIS — J189 Pneumonia, unspecified organism: Secondary | ICD-10-CM | POA: Diagnosis not present

## 2021-10-21 DIAGNOSIS — I350 Nonrheumatic aortic (valve) stenosis: Secondary | ICD-10-CM | POA: Diagnosis not present

## 2021-10-21 DIAGNOSIS — Z7901 Long term (current) use of anticoagulants: Secondary | ICD-10-CM | POA: Diagnosis not present

## 2021-10-21 DIAGNOSIS — G4733 Obstructive sleep apnea (adult) (pediatric): Secondary | ICD-10-CM

## 2021-10-21 DIAGNOSIS — I480 Paroxysmal atrial fibrillation: Secondary | ICD-10-CM | POA: Diagnosis not present

## 2021-10-21 DIAGNOSIS — R059 Cough, unspecified: Secondary | ICD-10-CM | POA: Diagnosis not present

## 2021-10-21 DIAGNOSIS — E119 Type 2 diabetes mellitus without complications: Secondary | ICD-10-CM

## 2021-10-21 LAB — BASIC METABOLIC PANEL
Anion gap: 8 (ref 5–15)
BUN: 18 mg/dL (ref 8–23)
CO2: 20 mmol/L — ABNORMAL LOW (ref 22–32)
Calcium: 8.4 mg/dL — ABNORMAL LOW (ref 8.9–10.3)
Chloride: 106 mmol/L (ref 98–111)
Creatinine, Ser: 1.04 mg/dL (ref 0.61–1.24)
GFR, Estimated: >60 mL/min (ref >60)
Glucose, Bld: 189 mg/dL — ABNORMAL HIGH (ref 70–99)
Potassium: 4.4 mmol/L (ref 3.5–5.1)
Sodium: 134 mmol/L — ABNORMAL LOW (ref 135–145)

## 2021-10-21 LAB — RESPIRATORY PANEL BY PCR

## 2021-10-21 LAB — GLUCOSE, CAPILLARY
Glucose-Capillary: 155 mg/dL — ABNORMAL HIGH (ref 70–99)
Glucose-Capillary: 230 mg/dL — ABNORMAL HIGH (ref 70–99)

## 2021-10-21 LAB — CBC
HCT: 42.8 % (ref 39.0–52.0)
Hemoglobin: 14.8 g/dL (ref 13.0–17.0)
MCH: 30.5 pg (ref 26.0–34.0)
MCHC: 34.6 g/dL (ref 30.0–36.0)
MCV: 88.1 fL (ref 80.0–100.0)
Platelets: 159 10*3/uL (ref 150–400)
RBC: 4.86 MIL/uL (ref 4.22–5.81)
RDW: 13.6 % (ref 11.5–15.5)
WBC: 6.3 10*3/uL (ref 4.0–10.5)
nRBC: 0 % (ref 0.0–0.2)

## 2021-10-21 LAB — STREP PNEUMONIAE URINARY ANTIGEN: Strep Pneumo Urinary Antigen: NEGATIVE

## 2021-10-21 LAB — PROCALCITONIN: Procalcitonin: <0.1 ng/mL

## 2021-10-21 MED ORDER — AZITHROMYCIN 250 MG PO TABS
250.0000 mg | ORAL_TABLET | Freq: Every day | ORAL | 0 refills | Status: AC
  Filled 2021-10-21: qty 4, 4d supply, fill #0

## 2021-10-21 MED ORDER — METOPROLOL TARTRATE 25 MG PO TABS
25.0000 mg | ORAL_TABLET | Freq: Two times a day (BID) | ORAL | 0 refills | Status: DC
Start: 2021-10-21 — End: 2021-10-29
  Filled 2021-10-21: qty 60, 30d supply, fill #0

## 2021-10-21 MED ORDER — VERAPAMIL HCL ER 240 MG PO TBCR
240.0000 mg | EXTENDED_RELEASE_TABLET | Freq: Every day | ORAL | Status: DC
  Administered 2021-10-21: 240 mg via ORAL
  Filled 2021-10-21: qty 1

## 2021-10-21 MED ORDER — AMOXICILLIN-POT CLAVULANATE 875-125 MG PO TABS
1.0000 | ORAL_TABLET | Freq: Two times a day (BID) | ORAL | 0 refills | Status: DC
  Filled 2021-10-21: qty 12, 6d supply, fill #0

## 2021-10-21 NOTE — Progress Notes (Signed)
?   10/20/21 2030  ?Assess: MEWS Score  ?Temp 99 ?F (37.2 ?C)  ?BP (!) 133/93  ?ECG Heart Rate (!) 138  ?Resp 20  ?Level of Consciousness Alert  ?SpO2 98 %  ?O2 Device Room Air  ?Assess: MEWS Score  ?MEWS Temp 0  ?MEWS Systolic 0  ?MEWS Pulse 3  ?MEWS RR 0  ?MEWS LOC 0  ?MEWS Score 3  ?MEWS Score Color Yellow  ?Assess: if the MEWS score is Yellow or Red  ?Were vital signs taken at a resting state? Yes  ?Focused Assessment No change from prior assessment  ?Early Detection of Sepsis Score *See Row Information* Medium  ?MEWS guidelines implemented *See Row Information* Yes  ?Treat  ?Pain Scale 0-10  ?Pain Score 0  ?Take Vital Signs  ?Increase Vital Sign Frequency  Yellow: Q 2hr X 2 then Q 4hr X 2, if remains yellow, continue Q 4hrs  ?Escalate  ?MEWS: Escalate Yellow: discuss with charge nurse/RN and consider discussing with provider and RRT  ?Notify: Charge Nurse/RN  ?Name of Charge Nurse/RN Notified Sharee Pimple, RN  ?Date Charge Nurse/RN Notified 10/20/21  ?Time Charge Nurse/RN Notified 2100  ?Notify: Provider  ?Provider Name/Title Dr. Rose/ Cardiology  ?Date Provider Notified 10/20/21  ?Time Provider Notified 2030  ?Notification Type Page  ?Notification Reason Other (Comment) ?(Pt maxed out on Cardizem drip.)  ?Provider response See new orders  ?Date of Provider Response 10/20/21  ?Time of Provider Response 2035  ?Document  ?Patient Outcome Stabilized after interventions  ?Progress note created (see row info) Yes  ? ? ?

## 2021-10-21 NOTE — Progress Notes (Signed)
Pt converted to NSR

## 2021-10-21 NOTE — Progress Notes (Signed)
Patient has been on droplet precautions. On the beginning of shift.  ?

## 2021-10-21 NOTE — Telephone Encounter (Signed)
Called pt and he is aware of April appt ?

## 2021-10-21 NOTE — Discharge Summary (Signed)
?Physician Discharge Summary ?  ?Patient: Edward Butler MRN: [id number] DOB: [birth year]  ?Admit date:     10/20/2021  ?Discharge date: 10/21/21  ?Discharge Physician: Edward Butler  ? ?PCP: Edward Stain, MD  ? ?Recommendations at discharge:  ?Follow up with [facility] cardiology, Dr. Salvadore Butler for PAF. Treated here for AFib w/RVR which converted to NSR with addition of metoprolol 39m po BID. ?Follow up with PCP, Dr. WJoya Gaskinsas scheduled 4/20 with plans to repeat CXR at that time after trial of antibiotics for presumptive pneumonia. Cautioned patient on importance of this follow up to rule out persistent opacity which would necessitate further work up for possible cancer. ? ?Discharge Diagnoses: ?Principal Problem: ?  Paroxysmal atrial fibrillation with RVR (HAthol ?Active Problems: ?  CAP (community acquired pneumonia) ?  Benign essential HTN ?  Type 2 diabetes mellitus without complication, without long-term current use of insulin ([place] ?  Hypothyroidism ?  Dyslipidemia ?  Aortic stenosis, moderate ?  OSA on CPAP ?  BPH (benign prostatic hyperplasia) ? ?Hospital Course: ?HAbdinasir Spadaforeis an 81y.o. male with a history of PAF on eliquis, AS, CVA, T2DM, HLD, OSA on CPAP who presented to the ED 3/20 with cough and fever. He was febrile to 101.2?F, in AFib with RVR and not hypoxic. CXR demonstrated left and possible right perihilar density suspicious for pneumonia in setting of fever, though inconclusive. Antibiotics, IV fluid were administered. Cardiology consulted and patient initiated on diltiazem infusion with eventual conversion to NSR around 4:20am the following morning. He remained in sinus rhythm without fever and has no hypoxia or dyspnea on exertion. He will be discharged in stable condition with plans for PCP and cardiology follow up and completion of oral antibiotics and addition of metoprolol. ? ?Assessment and Plan: ?* Paroxysmal atrial fibrillation with RVR (HDorris ?RVR is asymptomatic, precipitated by  pneumonia. Converted to NSR on diltiazem infusion. Restarted home diltiazem per cardiology who also added metoprolol tartrate 266mpo BID.  ?- Continue eliquis, hasn't missed doses ? ?CAP (community acquired pneumonia) ?Presumptive diagnosis with CXR opacity, acute productive cough and fever. Viral work up negative. Urine antigen studies negative thus far.  ?- Sputum culture pending, blood cultures NGTD. Will monitor after discharge.  ?- Complete typical antibiotic course and repeat CXR at follow up to ensure resolution. Pt also has no subacute/chronic systemic symptoms of malignancy. Discussed he may need CT chest if still opacity at follow up. ? ?Type 2 diabetes mellitus without complication, without long-term current use of insulin ([place]?HbA1c 6.1% on 07/2021. Continue metformin. Also on ACEi and statin at home. ? ?Benign essential HTN ?BP normotensive, continue enalapril, new metoprolol, and home diltiazem CR ? ?Hypothyroidism ?TSH wnl. ?Continue home synthroid   ? ?Dyslipidemia ?Continue zocor  ? ?Aortic stenosis, moderate ?Moderate AS based off last echo from [place] with his cardiologist.  ? ?OSA on CPAP ?On cpap  ? ?BPH (benign prostatic hyperplasia) ?Continue proscar  ? ?Consultants: Cardiology, Dr. SkMarlou Butler?Procedures performed: None  ?Disposition: Home ?Diet recommendation:  ?Discharge Diet Orders (From admission, onward)  ? ?  Start     Ordered  ? 10/21/21 0000  Diet - low sodium heart healthy       ? 10/21/21 1215  ? ?  ?  ? ?  ? ?Cardiac and Carb modified diet ?DISCHARGE MEDICATION: ?Allergies as of 10/21/2021   ? ?   Reactions  ? Levonorgestrel-ethinyl Estrad Other (See Comments)  ? Sneezing, eyes watering, nose running ?Sneezing,  eyes watering, nose running  ? ?  ? ?  ?Medication List  ?  ? ?TAKE these medications   ? ?Accu-Chek FastClix Lancets Misc ?Use to check blood sugar once daily. E11.9 ?  ?Accu-Chek Guide Me w/Device Kit ?USE AS DIRECTED ?  ?Accu-Chek Guide test strip ?Generic drug: glucose  blood ?USE TO CHECK BLOOD SUGAR ONCE DAILY. ?  ?amoxicillin-clavulanate 875-125 MG tablet ?Commonly known as: Augmentin ?Take 1 tablet by mouth 2 (two) times daily for 6 days. ?  ?apixaban 5 MG Tabs tablet ?Commonly known as: ELIQUIS ?Take 5 mg by mouth 2 (two) times daily. ?  ?azithromycin 250 MG tablet ?Commonly known as: ZITHROMAX ?Take 1 tablet (250 mg total) by mouth daily for 4 days. ?  ?cetirizine 5 MG tablet ?Commonly known as: ZYRTEC ?Take 1 tablet (5 mg total) by mouth daily. ?  ?cholecalciferol 25 MCG (1000 UNIT) tablet ?Commonly known as: VITAMIN D3 ?Take 2,000 Units by mouth daily. ?  ?enalapril 5 MG tablet ?Commonly known as: VASOTEC ?Take 5 mg by mouth daily. ?  ?finasteride 5 MG tablet ?Commonly known as: PROSCAR ?TAKE 1 TABLET EVERY DAY ?  ?fluticasone 50 MCG/ACT nasal spray ?Commonly known as: FLONASE ?Place 2 sprays into both nostrils daily as needed for allergies. ?  ?levothyroxine 25 MCG tablet ?Commonly known as: SYNTHROID ?Take 25 mcg by mouth daily before breakfast. ?  ?magnesium oxide 400 MG tablet ?Commonly known as: MAG-OX ?Take 400 mg by mouth daily. ?  ?metFORMIN 500 MG 24 hr tablet ?Commonly known as: GLUCOPHAGE-XR ?Take 2 tablets (1,000 mg total) by mouth 2 (two) times daily. ?  ?metoprolol tartrate 25 MG tablet ?Commonly known as: LOPRESSOR ?Take 1 tablet (25 mg total) by mouth 2 (two) times daily. ?  ?Oyster Shell Calcium/D 500-5 MG-MCG Tabs ?Take 1 tablet by mouth daily. ?  ?simvastatin 10 MG tablet ?Commonly known as: ZOCOR ?Take 10 mg by mouth daily. ?  ?verapamil 240 MG CR tablet ?Commonly known as: CALAN-SR ?Take 240 mg by mouth daily. ?  ? ?  ? ? Follow-up Information   ? ? Edward Stain, MD Follow up on 11/20/2021.   ?Specialty: Pulmonary Disease ?Contact information: ?[address] ?[address] ?[place] [address] ?[phone] ? ? ?  ?  ? ? Edward Kyle, MD .   ?Specialty: Cardiology ?Contact information: ?[address] ?[place]  [address] ?[phone] ? ? ?  ?  ? ?  ?  ? ?  ?Subjective: "I feel great." No dyspnea, chest pain, palpitations. Fever subsided. Eager to go home. ? ?Discharge Exam: ?BP 119/66 (BP Location: Right Arm)   Pulse 69   Temp 99.4 ?F (37.4 ?C) (Oral)   Resp 16   Ht _0  (1.702 m)   Wt 72.8 kg   SpO2 98%   BMI 25.12 kg/m?   ?Elderly, pleasant, well-appearing male in no distress ?RRR, II/VI systolic murmur greatest at RUSB, no edema ?Clear, nonlabored ?NSR on monitor, rate in 60-70's. ? ?Condition at discharge: good ? ?The results of significant diagnostics from this hospitalization (including imaging, microbiology, ancillary and laboratory) are listed below for reference.  ? ?Imaging Studies: ?DG Chest 2 View ? ?Result Date: 10/21/2021 ?CLINICAL DATA:  Fever, cough EXAM: CHEST - 2 VIEW COMPARISON:  10/20/2021 FINDINGS: Transverse diameter of heart is increased. Infiltrate is seen in the left parahilar region with no significant interval change. Infiltrate appears to be in the anterior aspect of left mid lung fields in the left  upper lobe. There are no new focal infiltrates or signs of alveolar pulmonary edema. There is blunting of left lateral CP angle. There is no pneumothorax. IMPRESSION: Infiltrate in the left parahilar region has not changed. This may suggest pneumonia in the left upper lobe. Follow-up studies until complete clearing occurs should be considered to rule out any underlying neoplastic process. Small left pleural effusion is seen. Electronically Signed   By: Elmer Picker M.D.   On: 10/21/2021 08:19  ? ?DG Chest Portable 1 View ? ?Result Date: 10/20/2021 ?CLINICAL DATA:  Cough and tachycardia. EXAM: PORTABLE CHEST 1 VIEW COMPARISON:  09/04/2020 FINDINGS: Midline trachea. Mild cardiomegaly. Left and possible right perihilar increased density. No pleural effusion or pneumothorax. IMPRESSION: Left and possible right perihilar increased density. This could represent asymmetric pulmonary edema or  left-sided pneumonia. However, an underlying left hilar mass or adenopathy is a concern. If the patient has symptoms which could be attributed to pulmonary edema or pneumonia, appropriate therapy and short-term radiographic follow-up cou

## 2021-10-21 NOTE — Plan of Care (Signed)
  Problem: Activity: Goal: Ability to tolerate increased activity will improve Outcome: Progressing   Problem: Clinical Measurements: Goal: Ability to maintain a body temperature in the normal range will improve Outcome: Progressing   Problem: Respiratory: Goal: Ability to maintain adequate ventilation will improve Outcome: Progressing Goal: Ability to maintain a clear airway will improve Outcome: Progressing   

## 2021-10-21 NOTE — Progress Notes (Signed)
? ?Progress Note ? ?Patient Name: Edward Butler ?Date of Encounter: 10/21/2021 ? ?[facility] Cardiologist: Dina Rich, MD  ? ?Subjective  ? ?Converted to sinus rhythm at 4:20 AM.  No chest pain.  No significant shortness of breath.  Positive cough. ? ?Inpatient Medications  ?  ?Scheduled Meds: ? apixaban  5 mg Oral BID  ? azithromycin  250 mg Oral Daily  ? finasteride  5 mg Oral Daily  ? guaiFENesin  600 mg Oral BID  ? insulin aspart  0-9 Units Subcutaneous TID WC  ? levothyroxine  25 mcg Oral QAC breakfast  ? magnesium oxide  400 mg Oral Daily  ? metoprolol tartrate  25 mg Oral BID  ? simvastatin  10 mg Oral Daily  ? sodium chloride flush  3 mL Intravenous Q12H  ? ?Continuous Infusions: ? sodium chloride    ? cefTRIAXone (ROCEPHIN)  IV    ? ?PRN Meds: ?sodium chloride, acetaminophen **OR** acetaminophen, sodium chloride flush  ? ?Vital Signs  ?  ?Vitals:  ? 10/21/21 2993 10/21/21 0340 10/21/21 0420 10/21/21 0849  ?BP: 105/72   121/71  ?Pulse: 100 99 64   ?Resp: 20   18  ?Temp: 99.9 ?F (37.7 ?C)   98.7 ?F (37.1 ?C)  ?TempSrc: Oral   Oral  ?SpO2: 97%   96%  ?Weight:      ?Height:      ? ? ?Intake/Output Summary (Last 24 hours) at 10/21/2021 1031 ?Last data filed at 10/20/2021 1858 ?Gross per 24 hour  ?Intake 27.47 ml  ?Output --  ?Net 27.47 ml  ? ?Last 3 Weights 10/20/2021 10/20/2021 07/03/2021  ?Weight (lbs) 160 lb 6.4 oz 160 lb 161 lb 3.2 oz  ?Weight (kg) 72.757 kg 72.576 kg 73.12 kg  ?   ? ?Telemetry  ?  ?Normal sinus rhythm 60s to 80s- Personally Reviewed ? ?ECG  ?  ?No new- Personally Reviewed ? ?Physical Exam  ? ?GEN: No acute distress.   ?Neck: No JVD ?Cardiac: RRR, no murmurs, rubs, or gallops.  ?Respiratory: Clear to auscultation bilaterally. ?GI: Soft, nontender, non-distended  ?MS: No edema; No deformity. ?Neuro:  Nonfocal  ?Psych: Normal affect  ? ?Labs  ?  ?High Sensitivity Troponin:  No results for input(s): TROPONINIHS in the last 720 hours.   ?Chemistry ?Recent Labs  ?Lab 10/20/21 ?1406  10/21/21 ?0310  ?NA 138 134*  ?K 3.8 4.4  ?CL 108 106  ?CO2 20* 20*  ?GLUCOSE 142* 189*  ?BUN 17 18  ?CREATININE 0.98 1.04  ?CALCIUM 8.5* 8.4*  ?PROT 6.2*  --   ?ALBUMIN 3.3*  --   ?AST 19  --   ?ALT 12  --   ?ALKPHOS 47  --   ?BILITOT 1.4*  --   ?GFRNONAA >60 >60  ?ANIONGAP 10 8  ?  ?Lipids No results for input(s): CHOL, TRIG, HDL, LABVLDL, LDLCALC, CHOLHDL in the last 168 hours.  ?Hematology ?Recent Labs  ?Lab 10/20/21 ?1400 10/21/21 ?0310  ?WBC 6.2 6.3  ?RBC 5.04 4.86  ?HGB 15.4 14.8  ?HCT 45.3 42.8  ?MCV 89.9 88.1  ?MCH 30.6 30.5  ?MCHC 34.0 34.6  ?RDW 13.8 13.6  ?PLT 153 159  ? ?Thyroid  ?Recent Labs  ?Lab 10/20/21 ?2008  ?TSH 3.031  ?  ?BNPNo results for input(s): BNP, PROBNP in the last 168 hours.  ?DDimer No results for input(s): DDIMER in the last 168 hours.  ? ?Radiology  ?  ?DG Chest 2 View ? ?Result Date: 10/21/2021 ?CLINICAL DATA:  Fever, cough EXAM: CHEST - 2 VIEW COMPARISON:  10/20/2021 FINDINGS: Transverse diameter of heart is increased. Infiltrate is seen in the left parahilar region with no significant interval change. Infiltrate appears to be in the anterior aspect of left mid lung fields in the left upper lobe. There are no new focal infiltrates or signs of alveolar pulmonary edema. There is blunting of left lateral CP angle. There is no pneumothorax. IMPRESSION: Infiltrate in the left parahilar region has not changed. This may suggest pneumonia in the left upper lobe. Follow-up studies until complete clearing occurs should be considered to rule out any underlying neoplastic process. Small left pleural effusion is seen. Electronically Signed   By: Elmer Picker M.D.   On: 10/21/2021 08:19  ? ?DG Chest Portable 1 View ? ?Result Date: 10/20/2021 ?CLINICAL DATA:  Cough and tachycardia. EXAM: PORTABLE CHEST 1 VIEW COMPARISON:  09/04/2020 FINDINGS: Midline trachea. Mild cardiomegaly. Left and possible right perihilar increased density. No pleural effusion or pneumothorax. IMPRESSION: Left and  possible right perihilar increased density. This could represent asymmetric pulmonary edema or left-sided pneumonia. However, an underlying left hilar mass or adenopathy is a concern. If the patient has symptoms which could be attributed to pulmonary edema or pneumonia, appropriate therapy and short-term radiographic follow-up could be performed. If not, recommend further evaluation with contrast enhanced chest CT. Electronically Signed   By: Abigail Miyamoto M.D.   On: 10/20/2021 14:53   ? ?Cardiac Studies  ? ?Prior echocardiogram demonstrated moderate aortic stenosis with normal EF. ? ?Patient Profile  ?   ?81 y.o. male with paroxysmal atrial fibrillation, moderate aortic stenosis chronic anticoagulation with Eliquis admitted with fever and cough, pneumonia ? ?Assessment & Plan  ?  ?Paroxysmal atrial fibrillation ?- Auto converted earlier this morning. ?- Metoprolol 25 mg twice a day was given in addition to his diltiazem drip previously.  Diltiazem drip has been discontinued.  Okay to continue low-dose metoprolol as outpatient. ?-Lets go ahead and place him back on his home verapamil 240 CD.  I will go ahead and order. ? ?Chronic anticoagulation ?- Continue with Eliquis 5 mg twice a day. ? ?Hyperlipidemia ?- On Zocor 10 mg daily.  No myalgias. ? ?Community-acquired pneumonia ?- Azithromycin ceftriaxone.  Primary team. ? ?He can follow-up with his primary cardiologist at [facility].  He has an appointment with him next month. ? ?We will go ahead and sign off. ? ?For questions or updates, please contact [place] ?Please consult [email or web address] for contact info under  ? ?  ?   ?Signed, ?Candee Furbish, MD  ?10/21/2021, 10:31 AM   ? ?

## 2021-10-21 NOTE — Progress Notes (Signed)
Sent 20 panel respiratory swab on incorrect swab. [facility] (lab) called and will complete order using covid swab they have for patient in lab already received. ?

## 2021-10-21 NOTE — Progress Notes (Signed)
Cardizem gtt off pt heart rate in 60's-70's A.Flutter.  ?

## 2021-10-21 NOTE — Discharge Instructions (Signed)

## 2021-10-21 NOTE — TOC Transition Note (Signed)
Transition of Care (TOC) - CM/SW Discharge Note ? ? ?Patient Details  ?Name: Edward Butler ?MRN: [id number] ?Date of Birth: 1941/05/28 ? ?Transition of Care (TOC) CM/SW Contact:  ?Zenon Mayo, RN ?Phone Number: ?10/21/2021, 1:08 PM ? ? ?Clinical Narrative:    ?Patient is for dc today, he states that his daughter is transporting him home today. ? ? ?  ?  ? ? ?Patient Goals and CMS Choice ?  ?  ?  ? ?Discharge Placement ?  ?           ?  ?  ?  ?  ? ?Discharge Plan and Services ?  ?  ?           ?  ?  ?  ?  ?  ?  ?  ?  ?  ?  ? ?Social Determinants of Health (SDOH) Interventions ?  ? ? ?Readmission Risk Interventions ?No flowsheet data found. ? ? ? ? ?

## 2021-10-22 ENCOUNTER — Ambulatory Visit: Payer: Self-pay | Admitting: *Deleted

## 2021-10-22 ENCOUNTER — Telehealth: Payer: Self-pay | Admitting: Pharmacist

## 2021-10-22 ENCOUNTER — Telehealth: Payer: Self-pay

## 2021-10-22 LAB — LEGIONELLA PNEUMOPHILA SEROGP 1 UR AG: L. pneumophila Serogp 1 Ur Ag: NEGATIVE

## 2021-10-22 NOTE — Telephone Encounter (Signed)
Per agent: ?"Pt reports that his sugar level is around 230 and he would like to discuss whether an appt is needed prior to his next appt on 11/20/21. Pt requests call back. Cb# [phone]." ? ?Called pt, states he just spoke with someone who was going to have "Dr. Lurena Joiner" get in touch with him. Advised to call back if needed. ?  ? ? ? ? ? ? ?Reason for Disposition ? Caller has already spoken with the PCP and has no further questions. ?   States spoke with someone at practice that will have "Dr. Lurena Joiner" get in touch with him. ? ?Protocols used: No Contact or Duplicate Contact Call-A-AH ? ?

## 2021-10-22 NOTE — Telephone Encounter (Signed)
Transition Care Management Follow-up Telephone Call ?Date of discharge and from where: 10/21/2021, [facility]  ?How have you been since you were released from the hospital? He said he is feeling fine.  ?Any questions or concerns? Yes - he is concerned about his blood sugar.  He said that he has checked it 3-4 times since returning home from the hospital and it has been around 230.  He stated that usually his blood sugar is half of that and he requested to speak to [employer], [place]. Message has been sent to [facility] with patient's request.  ? ?Items Reviewed: ?Did the pt receive and understand the discharge instructions provided? Yes  ?Medications obtained and verified? Yes  - he said he has all of his medications.  ?Other? No  ?Any new allergies since your discharge? No  ?Dietary orders reviewed? No ?Do you have support at home? Yes  ? ?Home Care and Equipment/Supplies: ?Were home health services ordered? no ?If so, what is the name of the agency? N/a  ?Has the agency set up a time to come to the patient's home? not applicable ?Were any new equipment or medical supplies ordered?  No ?What is the name of the medical supply agency? N/a ?Were you able to get the supplies/equipment? not applicable ?Do you have any questions related to the use of the equipment or supplies? No ? ?Functional Questionnaire: (I = Independent and D = Dependent) ?ADLs: independent ? ?Follow up appointments reviewed: ? ?PCP Hospital f/u appt confirmed? Yes  Scheduled to see Dr Joya Gaskins  - 11/20/2021  I offered to schedule an appointment for him to be seen sooner by another provider but he then requested to speak to [place], [facility] ?[facility] f/u appt confirmed?  None scheduled at this time.   ?Are transportation arrangements needed? No  ?If their condition worsens, is the pt aware to call PCP or go to the Emergency Dept.? Yes ?Was the patient provided with contact information for the PCP's office or ED? Yes ?Was to pt encouraged to  call back with questions or concerns? Yes ? ?

## 2021-10-22 NOTE — Telephone Encounter (Signed)
Called patient and offered counseling regarding infection and glucose dysregulation earlier today. Documented under a separate telephone note and sent to pt's PCP. ?

## 2021-10-22 NOTE — Telephone Encounter (Signed)
Patient was discharged from the hospital 10/21/2021. He is being tx'd for CAP and is completing Augmentin and azithromycin. Received call from him with concerns of hyperglycemia that began during the hospitalization. Reassured the patient that his recently-elevated CBG at home is likely due to his resp infection. He endorses home CBG readings in the 200s. As he continues to clear his infection with antibiotics, his CBGs at home should return to his baseline. He has not changed anything about his diet and his last A1c showed good control at 6.1% (07/03/21). Pt was agreeable to call in later this week if he was not improving to try and move up his appt with Dr. Joya Gaskins.  ?

## 2021-10-25 LAB — CULTURE, BLOOD (ROUTINE X 2)
Culture: NO GROWTH
Culture: NO GROWTH
Special Requests: ADEQUATE
Special Requests: ADEQUATE

## 2021-10-27 ENCOUNTER — Encounter: Payer: Self-pay | Admitting: Emergency Medicine

## 2021-10-27 ENCOUNTER — Ambulatory Visit
Admission: EM | Admit: 2021-10-27 | Discharge: 2021-10-27 | Disposition: A | Discharge status: Home / Self Care | Payer: Medicare HMO

## 2021-10-27 ENCOUNTER — Emergency Department (HOSPITAL_COMMUNITY): Payer: Medicare HMO

## 2021-10-27 ENCOUNTER — Encounter (HOSPITAL_COMMUNITY): Payer: Self-pay

## 2021-10-27 ENCOUNTER — Observation Stay (HOSPITAL_COMMUNITY)
Admission: EM | Admit: 2021-10-27 | Discharge: 2021-10-29 | Disposition: A | Discharge status: Home / Self Care | Payer: Medicare HMO | Attending: Cardiology | Admitting: Cardiology

## 2021-10-27 ENCOUNTER — Other Ambulatory Visit: Payer: Self-pay

## 2021-10-27 DIAGNOSIS — E039 Hypothyroidism, unspecified: Secondary | ICD-10-CM | POA: Diagnosis not present

## 2021-10-27 DIAGNOSIS — I442 Atrioventricular block, complete: Secondary | ICD-10-CM | POA: Diagnosis not present

## 2021-10-27 DIAGNOSIS — R9431 Abnormal electrocardiogram [ECG] [EKG]: Secondary | ICD-10-CM | POA: Insufficient documentation

## 2021-10-27 DIAGNOSIS — I482 Chronic atrial fibrillation, unspecified: Secondary | ICD-10-CM | POA: Insufficient documentation

## 2021-10-27 DIAGNOSIS — E785 Hyperlipidemia, unspecified: Secondary | ICD-10-CM | POA: Insufficient documentation

## 2021-10-27 DIAGNOSIS — Z79899 Other long term (current) drug therapy: Secondary | ICD-10-CM | POA: Insufficient documentation

## 2021-10-27 DIAGNOSIS — Z20822 Contact with and (suspected) exposure to covid-19: Secondary | ICD-10-CM | POA: Diagnosis not present

## 2021-10-27 DIAGNOSIS — Z7901 Long term (current) use of anticoagulants: Secondary | ICD-10-CM | POA: Diagnosis not present

## 2021-10-27 DIAGNOSIS — I4510 Unspecified right bundle-branch block: Secondary | ICD-10-CM | POA: Diagnosis not present

## 2021-10-27 DIAGNOSIS — I080 Rheumatic disorders of both mitral and aortic valves: Secondary | ICD-10-CM | POA: Diagnosis not present

## 2021-10-27 DIAGNOSIS — I959 Hypotension, unspecified: Secondary | ICD-10-CM | POA: Diagnosis not present

## 2021-10-27 DIAGNOSIS — R000 Tachycardia, unspecified: Secondary | ICD-10-CM | POA: Diagnosis not present

## 2021-10-27 DIAGNOSIS — E119 Type 2 diabetes mellitus without complications: Secondary | ICD-10-CM | POA: Insufficient documentation

## 2021-10-27 DIAGNOSIS — I4891 Unspecified atrial fibrillation: Secondary | ICD-10-CM | POA: Diagnosis not present

## 2021-10-27 DIAGNOSIS — R011 Cardiac murmur, unspecified: Secondary | ICD-10-CM | POA: Insufficient documentation

## 2021-10-27 DIAGNOSIS — R42 Dizziness and giddiness: Secondary | ICD-10-CM | POA: Diagnosis not present

## 2021-10-27 DIAGNOSIS — Z79890 Hormone replacement therapy (postmenopausal): Secondary | ICD-10-CM | POA: Insufficient documentation

## 2021-10-27 DIAGNOSIS — Z8673 Personal history of transient ischemic attack (TIA), and cerebral infarction without residual deficits: Secondary | ICD-10-CM | POA: Insufficient documentation

## 2021-10-27 DIAGNOSIS — I700 Atherosclerosis of aorta: Secondary | ICD-10-CM | POA: Insufficient documentation

## 2021-10-27 DIAGNOSIS — I119 Hypertensive heart disease without heart failure: Secondary | ICD-10-CM | POA: Insufficient documentation

## 2021-10-27 DIAGNOSIS — Z85828 Personal history of other malignant neoplasm of skin: Secondary | ICD-10-CM | POA: Insufficient documentation

## 2021-10-27 DIAGNOSIS — I10 Essential (primary) hypertension: Secondary | ICD-10-CM | POA: Diagnosis not present

## 2021-10-27 DIAGNOSIS — I480 Paroxysmal atrial fibrillation: Secondary | ICD-10-CM | POA: Insufficient documentation

## 2021-10-27 DIAGNOSIS — Z8701 Personal history of pneumonia (recurrent): Secondary | ICD-10-CM | POA: Insufficient documentation

## 2021-10-27 DIAGNOSIS — R55 Syncope and collapse: Secondary | ICD-10-CM | POA: Insufficient documentation

## 2021-10-27 DIAGNOSIS — R001 Bradycardia, unspecified: Secondary | ICD-10-CM

## 2021-10-27 DIAGNOSIS — I495 Sick sinus syndrome: Secondary | ICD-10-CM

## 2021-10-27 DIAGNOSIS — R0602 Shortness of breath: Secondary | ICD-10-CM | POA: Insufficient documentation

## 2021-10-27 DIAGNOSIS — R778 Other specified abnormalities of plasma proteins: Secondary | ICD-10-CM | POA: Diagnosis not present

## 2021-10-27 DIAGNOSIS — G4733 Obstructive sleep apnea (adult) (pediatric): Secondary | ICD-10-CM | POA: Insufficient documentation

## 2021-10-27 DIAGNOSIS — R918 Other nonspecific abnormal finding of lung field: Secondary | ICD-10-CM | POA: Diagnosis not present

## 2021-10-27 DIAGNOSIS — N400 Enlarged prostate without lower urinary tract symptoms: Secondary | ICD-10-CM | POA: Diagnosis not present

## 2021-10-27 DIAGNOSIS — Z7984 Long term (current) use of oral hypoglycemic drugs: Secondary | ICD-10-CM | POA: Diagnosis not present

## 2021-10-27 DIAGNOSIS — Z9989 Dependence on other enabling machines and devices: Secondary | ICD-10-CM | POA: Insufficient documentation

## 2021-10-27 LAB — COMPREHENSIVE METABOLIC PANEL
ALT: 25 U/L (ref 0–44)
AST: 34 U/L (ref 15–41)
Albumin: 3.3 g/dL — ABNORMAL LOW (ref 3.5–5.0)
Alkaline Phosphatase: 43 U/L (ref 38–126)
Anion gap: 9 (ref 5–15)
BUN: 19 mg/dL (ref 8–23)
CO2: 22 mmol/L (ref 22–32)
Calcium: 9.4 mg/dL (ref 8.9–10.3)
Chloride: 107 mmol/L (ref 98–111)
Creatinine, Ser: 1.28 mg/dL — ABNORMAL HIGH (ref 0.61–1.24)
GFR, Estimated: 56 mL/min — ABNORMAL LOW (ref >60)
Glucose, Bld: 237 mg/dL — ABNORMAL HIGH (ref 70–99)
Potassium: 4.6 mmol/L (ref 3.5–5.1)
Sodium: 138 mmol/L (ref 135–145)
Total Bilirubin: 0.9 mg/dL (ref 0.3–1.2)
Total Protein: 6.2 g/dL — ABNORMAL LOW (ref 6.5–8.1)

## 2021-10-27 LAB — CBC WITH DIFFERENTIAL/PLATELET
Abs Immature Granulocytes: 0.08 10*3/uL — ABNORMAL HIGH (ref 0.00–0.07)
Basophils Absolute: 0.1 10*3/uL (ref 0.0–0.1)
Basophils Relative: 1 %
Eosinophils Absolute: 0.4 10*3/uL (ref 0.0–0.5)
Eosinophils Relative: 4 %
HCT: 42.7 % (ref 39.0–52.0)
Hemoglobin: 14.5 g/dL (ref 13.0–17.0)
Immature Granulocytes: 1 %
Lymphocytes Relative: 20 %
Lymphs Abs: 1.9 10*3/uL (ref 0.7–4.0)
MCH: 31.3 pg (ref 26.0–34.0)
MCHC: 34 g/dL (ref 30.0–36.0)
MCV: 92 fL (ref 80.0–100.0)
Monocytes Absolute: 0.6 10*3/uL (ref 0.1–1.0)
Monocytes Relative: 6 %
Neutro Abs: 6.5 10*3/uL (ref 1.7–7.7)
Neutrophils Relative %: 68 %
Platelets: 263 10*3/uL (ref 150–400)
RBC: 4.64 MIL/uL (ref 4.22–5.81)
RDW: 13.8 % (ref 11.5–15.5)
WBC: 9.5 10*3/uL (ref 4.0–10.5)
nRBC: 0 % (ref 0.0–0.2)

## 2021-10-27 LAB — MAGNESIUM: Magnesium: 1.8 mg/dL (ref 1.7–2.4)

## 2021-10-27 LAB — TROPONIN I (HIGH SENSITIVITY): Troponin I (High Sensitivity): 285 ng/L (ref <18)

## 2021-10-27 LAB — RESP PANEL BY RT-PCR (FLU A&B, COVID) ARPGX2
Influenza A by PCR: NEGATIVE
Influenza B by PCR: NEGATIVE
SARS Coronavirus 2 by RT PCR: NEGATIVE

## 2021-10-27 LAB — LACTIC ACID, PLASMA
Lactic Acid, Venous: 3.2 mmol/L (ref 0.5–1.9)
Lactic Acid, Venous: 2.2 mmol/L (ref 0.5–1.9)

## 2021-10-27 MED ORDER — ACETAMINOPHEN 325 MG PO TABS: 650.0000 mg | ORAL_TABLET | ORAL | Status: DC | PRN

## 2021-10-27 MED ORDER — SODIUM CHLORIDE 0.9 % IV BOLUS
500.0000 mL | Freq: Once | INTRAVENOUS | Status: AC
  Administered 2021-10-27: 500 mL via INTRAVENOUS

## 2021-10-27 MED ORDER — SIMVASTATIN 20 MG PO TABS
10.0000 mg | ORAL_TABLET | Freq: Every day | ORAL | Status: DC
  Administered 2021-10-28 – 2021-10-29 (×2): 10 mg via ORAL
  Filled 2021-10-27 (×2): qty 1

## 2021-10-27 MED ORDER — ONDANSETRON HCL 4 MG/2ML IJ SOLN: 4.0000 mg | Freq: Four times a day (QID) | INTRAMUSCULAR | Status: DC | PRN

## 2021-10-27 MED ORDER — LEVOTHYROXINE SODIUM 25 MCG PO TABS
25.0000 ug | ORAL_TABLET | Freq: Every day | ORAL | Status: DC
  Administered 2021-10-28 – 2021-10-29 (×2): 25 ug via ORAL
  Filled 2021-10-27 (×2): qty 1

## 2021-10-27 MED ORDER — APIXABAN 5 MG PO TABS
5.0000 mg | ORAL_TABLET | Freq: Two times a day (BID) | ORAL | Status: DC
  Administered 2021-10-27 – 2021-10-29 (×4): 5 mg via ORAL
  Filled 2021-10-27 (×4): qty 1

## 2021-10-27 MED ORDER — ATROPINE SULFATE 1 MG/10ML IJ SOSY
1.0000 mg | PREFILLED_SYRINGE | Freq: Once | INTRAMUSCULAR | Status: AC
  Administered 2021-10-27: 1 mg via INTRAVENOUS
  Filled 2021-10-27: qty 10

## 2021-10-27 MED ORDER — FINASTERIDE 5 MG PO TABS
5.0000 mg | ORAL_TABLET | Freq: Every day | ORAL | Status: DC
  Administered 2021-10-28 – 2021-10-29 (×2): 5 mg via ORAL
  Filled 2021-10-27 (×2): qty 1

## 2021-10-27 NOTE — ED Provider Notes (Signed)
?[place] ?Provider Note ? ? ?CSN: [id number] ?Arrival date & time: 10/27/21  2023 ? ?  ? ?History ? ?Chief Complaint  ?Patient presents with  ? Bradycardia  ? ? ?Edward Butler is a 81 y.o. male. ? ?Patient presented with lightheadedness dizziness and near syncope.  He states he was fine this morning but by the late afternoon he was feeling lightheaded dizzy and felt like he was going to pass out.  He went to urgent care and was told his heart rate was in the 41 bpm range.  He was sent to the ER for further evaluation.  Patient otherwise denies any headache no chest pain abdominal pain.  No reports of fevers or cough or vomiting or diarrhea. ? ? ?  ? ?Home Medications ?Prior to Admission medications   ?Medication Sig Start Date End Date Taking? Authorizing Provider  ?Accu-Chek FastClix Lancets MISC Use to check blood sugar once daily. E11.9 02/26/21   Elsie Stain, MD  ?amoxicillin-clavulanate (AUGMENTIN) 875-125 MG tablet Take 1 tablet by mouth 2 (two) times daily for 6 days. 10/21/21 10/27/21  Patrecia Pour, MD  ?apixaban (ELIQUIS) 5 MG TABS tablet Take 5 mg by mouth 2 (two) times daily. 07/13/19   [provider]  ?Blood Glucose Monitoring Suppl (ACCU-CHEK GUIDE ME) w/Device KIT USE AS DIRECTED 09/19/21   Elsie Stain, MD  ?Calcium Carbonate-Vitamin D (OYSTER SHELL CALCIUM/D) 500-5 MG-MCG TABS Take 1 tablet by mouth daily.    [provider]  ?cetirizine (ZYRTEC) 5 MG tablet Take 1 tablet (5 mg total) by mouth daily. ?Patient not taking: Reported on 10/20/2021 03/13/21   Jaynee Eagles, PA-C  ?cholecalciferol (VITAMIN D3) 25 MCG (1000 UNIT) tablet Take 2,000 Units by mouth daily.    [provider]  ?enalapril (VASOTEC) 5 MG tablet Take 5 mg by mouth daily. 09/28/18   [provider]  ?finasteride (PROSCAR) 5 MG tablet TAKE 1 TABLET EVERY DAY 09/19/21   Elsie Stain, MD  ?fluticasone [facility]) 50 MCG/ACT nasal spray Place 2 sprays  into both nostrils daily as needed for allergies.    [provider]  ?glucose blood (ACCU-CHEK GUIDE) test strip USE TO CHECK BLOOD SUGAR ONCE DAILY. 09/19/21   Elsie Stain, MD  ?levothyroxine (SYNTHROID) 25 MCG tablet Take 25 mcg by mouth daily before breakfast. 12/03/18   [provider]  ?magnesium oxide (MAG-OX) 400 MG tablet Take 400 mg by mouth daily. 12/13/18   [provider]  ?metFORMIN (GLUCOPHAGE-XR) 500 MG 24 hr tablet Take 2 tablets (1,000 mg total) by mouth 2 (two) times daily. 09/19/21   Elsie Stain, MD  ?metoprolol tartrate (LOPRESSOR) 25 MG tablet Take 1 tablet (25 mg total) by mouth 2 (two) times daily. 10/21/21   Patrecia Pour, MD  ?simvastatin (ZOCOR) 10 MG tablet Take 10 mg by mouth daily. 09/28/18   [provider]  ?verapamil (CALAN-SR) 240 MG CR tablet Take 240 mg by mouth daily. 12/11/18   [provider]  ?   ? ?Allergies    ?Levonorgestrel-ethinyl estrad   ? ?Review of Systems   ?Review of Systems  ?Constitutional:  Negative for fever.  ?HENT:  Negative for ear pain and sore throat.   ?Eyes:  Negative for pain.  ?Respiratory:  Negative for cough.   ?Cardiovascular:  Negative for chest pain.  ?Gastrointestinal:  Negative for abdominal pain.  ?Genitourinary:  Negative for flank pain.  ?Musculoskeletal:  Negative for back pain.  ?  Skin:  Negative for color change and rash.  ?Neurological:  Negative for syncope.  ?All other systems reviewed and are negative. ? ?Physical Exam ?Updated Vital Signs ?BP 94/64   Pulse (!) 52   Temp 98.3 ?F (36.8 ?C)   Resp (!) 27   SpO2 98%  ?Physical Exam ?Constitutional:   ?   Appearance: He is well-developed.  ?HENT:  ?   Head: Normocephalic.  ?   Nose: Nose normal.  ?Eyes:  ?   Extraocular Movements: Extraocular movements intact.  ?Cardiovascular:  ?   Rate and Rhythm: Bradycardia present. Rhythm irregular.  ?Pulmonary:  ?   Effort: Pulmonary effort is normal.  ?Skin: ?   Coloration: Skin is not jaundiced.   ?Neurological:  ?   Mental Status: He is alert. Mental status is at baseline.  ? ? ?ED Results / Procedures / Treatments   ?Labs ?(all labs ordered are listed, but only abnormal results are displayed) ?Labs Reviewed  ?CBC WITH DIFFERENTIAL/PLATELET - Abnormal; Notable for the following components:  ?    Result Value  ? Abs Immature Granulocytes 0.08 (*)   ? All other components within normal limits  ?COMPREHENSIVE METABOLIC PANEL - Abnormal; Notable for the following components:  ? Glucose, Bld 237 (*)   ? Creatinine, Ser 1.28 (*)   ? Total Protein 6.2 (*)   ? Albumin 3.3 (*)   ? GFR, Estimated 56 (*)   ? All other components within normal limits  ?LACTIC ACID, PLASMA - Abnormal; Notable for the following components:  ? Lactic Acid, Venous 3.2 (*)   ? All other components within normal limits  ?TROPONIN I (HIGH SENSITIVITY) - Abnormal; Notable for the following components:  ? Troponin I (High Sensitivity) 285 (*)   ? All other components within normal limits  ?CULTURE, BLOOD (ROUTINE X 2)  ?CULTURE, BLOOD (ROUTINE X 2)  ?RESP PANEL BY RT-PCR (FLU A&B, COVID) [id number]  ?MAGNESIUM  ?LACTIC ACID, PLASMA  ? ? ?EKG ?EKG Interpretation ? ?Date/Time:  Monday October 27 2021 20:27:27 EDT ?Ventricular Rate:  42 ?PR Interval:    ?QRS Duration: 148 ?QT Interval:  600 ?QTC Calculation: 502 ?R Axis:   66 ?Text Interpretation: Atrial fibrillation Right bundle branch block Abnormal T, consider ischemia, inferior leads Confirmed by Thamas Jaegers ([id number]) on 10/27/2021 8:29:04 PM ? ?Radiology ?DG Chest Portable 1 View ? ?Result Date: 10/27/2021 ?CLINICAL DATA:  Dizziness. EXAM: PORTABLE CHEST 1 VIEW COMPARISON:  None. FINDINGS: There are multiple overlying radiopaque cardiac lead wires. The cardiac silhouette is mildly enlarged and unchanged in size. There is mild calcification of the aortic arch. Mild left perihilar and left infrahilar infiltrate is seen. This is mildly decreased in severity when compared to the prior study. There is no  evidence of a pleural effusion or pneumothorax. The visualized skeletal structures are unremarkable. IMPRESSION: Mild left perihilar and left infrahilar infiltrate, mildly decreased in severity when compared to the prior study. Electronically Signed   By: Virgina Norfolk M.D.   On: 10/27/2021 21:05   ? ?Procedures ?Marland KitchenCritical Care ?Performed by: Luna Fuse, MD ?Authorized by: Luna Fuse, MD  ? ?Critical care provider statement:  ?  Critical care time (minutes):  40 ?  Critical care time was exclusive of:  Separately billable procedures and treating other patients and teaching time ?  Critical care was necessary to treat or prevent imminent or life-threatening deterioration of the following conditions:  Cardiac failure  ? ? ?Medications Ordered in ED ?Medications  ?  sodium chloride 0.9 % bolus 500 mL (0 mLs Intravenous Stopped 10/27/21 2112)  ?atropine 1 MG/10ML injection 1 mg (1 mg Intravenous Given 10/27/21 2046)  ? ? ?ED Course/ Medical Decision Making/ A&P ?  ?                        ?Medical Decision Making ?Amount and/or Complexity of Data Reviewed ?Labs: ordered. ?Radiology: ordered. ? ?Risk ?Prescription drug management. ? ? ?Chart review shows urgent care visit earlier today. ? ?Cardiac monitor shows atrial fibrillation, bradycardic rate. ? ?Comorbidities, influencing complexity include history of atrial fibrillation. ? ?Consultation with cardiology requested. ? ?Patient given 500 cc bolus fluids here and IV atropine.  Blood pressure improved slightly to 90 systolic.  Patient otherwise states he feels fine laying still.  Heart rate improved somewhat to about 50 to 55 bpm. ? ?External pacing pads placed on the patient, cardiology evaluation ongoing. ? ? ? ? ? ? ? ?Final Clinical Impression(s) / ED Diagnoses ?Final diagnoses:  ?Symptomatic bradycardia  ? ? ?Rx / DC Orders ?ED Discharge Orders   ? ? None  ? ?  ? ? ?  ?Luna Fuse, MD ?10/27/21 2149 ? ?

## 2021-10-27 NOTE — ED Notes (Signed)
EMS notified for transport 

## 2021-10-27 NOTE — ED Provider Notes (Signed)
?[place] ? ? ? ?CSN: [id number] ?Arrival date & time: 10/27/21  1913 ? ? ?  ? ?History   ?Chief Complaint ?Chief Complaint  ?Patient presents with  ? Bradycardia  ? ? ?HPI ?Edward Butler is a 81 y.o. male.  ? ?Patient here today for evaluation of symptomatic bradycardia. He was recently started on new medications after discharge from hospital 6 days ago. He reports he has felt as if he would pass out this afternoon. He notes earlier today he was feeling like he could "run a marathon" but later this afternoon started to feel very bad with sweating and fatigue.  ? ?The history is provided by the patient and a relative.  ? ?Past Medical History:  ?Diagnosis Date  ? Aneurysm ([place])   ? Atrial fibrillation ([place])   ? Diabetes mellitus without complication ([place])   ? Hypertension   ? Stroke [facility])   ? ? ?Patient Active Problem List  ? Diagnosis Date Noted  ? CAP (community acquired pneumonia) 10/20/2021  ? Non-recurrent acute suppurative otitis media of right ear without spontaneous rupture of tympanic membrane 07/04/2021  ? OSA on CPAP 12/05/2020  ? BPH (benign prostatic hyperplasia) 12/05/2020  ? Hypothyroidism 12/05/2020  ? 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 Dermatochalasis of both upper eyelids 07/23/2020  ? Myopia with astigmatism and presbyopia, bilateral 07/23/2020  ? Posterior vitreous detachment of both eyes 07/23/2020  ? Chronic anticoagulation 07/13/2019  ? Paroxysmal atrial fibrillation with RVR ([place]) 07/13/2019  ? Encounter for anticoagulation discussion and counseling 04/13/2019  ? Aortic stenosis, moderate 12/13/2018  ? Benign essential HTN 12/13/2018  ? Dyslipidemia 12/13/2018  ? Type 2 diabetes mellitus without complication, without long-term current use of insulin ([place]) 12/13/2018  ? History of colonic polyps 11/13/2016  ? ? ?Past Surgical History:  ?Procedure Laterality Date  ? skin cancer surgeries      ? ? ? ? ? ?Home Medications   ? ?Prior to Admission medications   ?Medication Sig Start Date End Date Taking? Authorizing Provider  ?Accu-Chek FastClix Lancets MISC Use to check blood sugar once daily. E11.9 02/26/21   Elsie Stain, MD  ?amoxicillin-clavulanate (AUGMENTIN) 875-125 MG tablet Take 1 tablet by mouth 2 (two) times daily for 6 days. 10/21/21 10/27/21  Patrecia Pour, MD  ?apixaban (ELIQUIS) 5 MG TABS tablet Take 5 mg by mouth 2 (two) times daily. 07/13/19   [provider]  ?Blood Glucose Monitoring Suppl (ACCU-CHEK GUIDE ME) w/Device KIT USE AS DIRECTED 09/19/21   Elsie Stain, MD  ?Calcium Carbonate-Vitamin D (OYSTER SHELL CALCIUM/D) 500-5 MG-MCG TABS Take 1 tablet by mouth daily.    [provider]  ?cetirizine (ZYRTEC) 5 MG tablet Take 1 tablet (5 mg total) by mouth daily. ?Patient not taking: Reported on 10/20/2021 03/13/21   Jaynee Eagles, PA-C  ?cholecalciferol (VITAMIN D3) 25 MCG (1000 UNIT) tablet Take 2,000 Units by mouth daily.    [provider]  ?enalapril (VASOTEC) 5 MG tablet Take 5 mg by mouth daily. 09/28/18   [provider]  ?finasteride (PROSCAR) 5 MG tablet TAKE 1 TABLET EVERY DAY 09/19/21   Elsie Stain, MD  ?fluticasone [facility]) 50 MCG/ACT nasal spray Place 2 sprays into both nostrils daily as needed for allergies.    [provider]  ?glucose blood (ACCU-CHEK GUIDE) test strip USE TO CHECK BLOOD SUGAR ONCE DAILY. 09/19/21   Elsie Stain, MD  ?  levothyroxine (SYNTHROID) 25 MCG tablet Take 25 mcg by mouth daily before breakfast. 12/03/18   [provider]  ?magnesium oxide (MAG-OX) 400 MG tablet Take 400 mg by mouth daily. 12/13/18   [provider]  ?metFORMIN (GLUCOPHAGE-XR) 500 MG 24 hr tablet Take 2 tablets (1,000 mg total) by mouth 2 (two) times daily. 09/19/21   Elsie Stain, MD  ?metoprolol tartrate (LOPRESSOR) 25 MG tablet Take 1 tablet (25 mg total) by mouth 2 (two) times daily. 10/21/21   Patrecia Pour, MD  ?simvastatin (ZOCOR) 10 MG tablet Take 10 mg by mouth daily. 09/28/18   [provider]  ?verapamil (CALAN-SR) 240 MG CR tablet Take 240 mg by mouth daily. 12/11/18   [provider]  ? ? ?Family History ?History reviewed. No pertinent family history. ? ?Social History ?Social History  ? ?Tobacco Use  ? Smoking status: Never  ? Smokeless tobacco: Never  ?Vaping Use  ? Vaping Use: Never used  ?Substance Use Topics  ? Alcohol use: Not Currently  ? Drug use: Not Currently  ? ? ? ?Allergies   ?Levonorgestrel-ethinyl estrad ? ? ?Review of Systems ?Review of Systems  ?Constitutional:  Positive for diaphoresis and fatigue. Negative for chills and fever.  ?Eyes:  Negative for discharge and redness.  ?Respiratory:  Negative for shortness of breath.   ?Neurological:  Positive for light-headedness.  ? ? ?Physical Exam ?Triage Vital Signs ?ED Triage Vitals  ?Enc Vitals Group  ?   BP 10/27/21 1929 105/63  ?   Pulse Rate 10/27/21 1929 (!) 38  ?   Resp 10/27/21 1929 16  ?   Temp 10/27/21 1929 97.6 ?F (36.4 ?C)  ?   Temp Source 10/27/21 1929 Oral  ?   SpO2 10/27/21 1929 96 %  ?   Weight --   ?   Height --   ?   Head Circumference --   ?   Peak Flow --   ?   Pain Score 10/27/21 1930 0  ?   Pain Loc --   ?   Pain Edu? --   ?   Excl. in [place]? --   ? ?No data found. ? ?Updated Vital Signs ?BP 105/63 (BP Location: Left Arm)   Pulse (!) 38   Temp 97.6 ?F (36.4 ?C) (Oral)   Resp 16   SpO2 96%  ?  ? ?Physical Exam ?Vitals and nursing note reviewed.  ?Constitutional:   ?   General: He is in acute distress.  ?   Appearance: He is ill-appearing (appears pale).  ?HENT:  ?   Head: Normocephalic and atraumatic.  ?Eyes:  ?   Conjunctiva/sclera: Conjunctivae normal.  ?Cardiovascular:  ?   Rate and Rhythm: Bradycardia present.  ?Pulmonary:  ?   Effort: Pulmonary effort is normal. No respiratory distress.  ?Neurological:  ?   Mental Status: He is alert.  ? ? ? ?UC Treatments / Results  ?Labs ?(all labs ordered are listed,  but only abnormal results are displayed) ?Labs Reviewed - No data to display ? ?EKG ? ? ?Radiology ?No results found. ? ?Procedures ?Procedures (including critical care time) ? ?Medications Ordered in UC ?Medications - No data to display ? ?Initial Impression / Assessment and Plan / UC Course  ?I have reviewed the triage vital signs and the nursing notes. ? ?Pertinent labs & imaging results that were available during my care of the patient were reviewed by me and considered in my medical decision making (see  chart for details). ? ? EMS notified immediately for emergent transport. Patient is agreeable for transport.  ? ? ?Final Clinical Impressions(s) / UC Diagnoses  ? ?Final diagnoses:  ?Symptomatic bradycardia  ?Paroxysmal atrial fibrillation with RVR ([place])  ? ?Discharge Instructions   ?None ?  ? ?ED Prescriptions   ?None ?  ? ?PDMP not reviewed this encounter. ?  ?Francene Finders, PA-C ?10/27/21 1950 ? ?

## 2021-10-27 NOTE — ED Triage Notes (Signed)
Pt BIB Ems from UC due to dizziness and feeling like he was going to pass out on arrival to the UC he was brady 41bpm. Pt was given 0.5 atropine and 500 bolus on prior to arrival.Pt is alert & oriented on arrival. ?

## 2021-10-27 NOTE — ED Triage Notes (Signed)
Began feeling like he was going to pass out, felt sweaty, sat down at home. He realized his heart rate was very low, came to urgent care. HR 38, BP 105/63 in triage. Patient states he feels a little better now than he did earlier. Recently had medication changes to his metoprolol and Cardizem.  ?

## 2021-10-27 NOTE — H&P (Signed)
?Cardiology Admission History and Physical:  ? ?Patient ID: Edward Butler ?MRN: [id number]; DOB: 04-Sep-1940  ? ?Admission date: 10/27/2021 ? ?PCP:  Elsie Stain, MD ?  ?[facility] Providers ?Cardiologist:  Dina Rich, MD      ? ? ?Chief Complaint:  near syncope / bradycardia ? ?Patient Profile:  ? ?Edward Butler is a 81 y.o. male with htn, hld, t2dm, pAfib, hypothyroidism, BPH who is being seen 10/27/2021 for the evaluation of symptomatic bradycardia. ? ?History of Present Illness:  ? ?Edward Butler s a 81 y.o. male with htn, hld, t2dm, pAfib, hypothyroidism, BPH who is being seen 10/27/2021 for the evaluation of symptomatic bradycardia. Notably, he was admitted recently for treatment of PNA and had atrial fibrillation with RVR during that initial episode. This required CCB and BB to rate control. Ultimately, he reverted to sinus rhythm and had good HR with CCB and metop. He was discharged with prior verapamil 240 mg CD and metoprolol tartrate 25 mg bid.  ? ?He was doing okay at home for the last week. Had some episodes of lightheadedness which became worse today. After dinner, he felt so poorly that he went to UC to be evaluated. ? ?There, he was noted to have HR in the 30s and slow afib. He was transferred to ED via EMS. En route he was given volume, then atropine when he arrived. When I saw him, he was feeling much better, BP was 101/58 and HR in the 50s. He thinks his last dose of metop was this AM. Lactate 3.2 and trop 285. No chest pain.  ? ?No other acute symptoms. ? ? ?Past Medical History:  ?Diagnosis Date  ? Aneurysm ([place])   ? Atrial fibrillation ([place])   ? Diabetes mellitus without complication ([place])   ? Hypertension   ? Stroke [facility])   ? ? ?Past Surgical History:  ?Procedure Laterality Date  ? skin cancer surgeries     ?  ? ?Medications Prior to Admission: ?Prior to Admission medications   ?Medication Sig Start Date End Date Taking? Authorizing Provider  ?Accu-Chek FastClix Lancets MISC Use to check  blood sugar once daily. E11.9 02/26/21   Elsie Stain, MD  ?amoxicillin-clavulanate (AUGMENTIN) 875-125 MG tablet Take 1 tablet by mouth 2 (two) times daily for 6 days. 10/21/21 10/27/21  Patrecia Pour, MD  ?apixaban (ELIQUIS) 5 MG TABS tablet Take 5 mg by mouth 2 (two) times daily. 07/13/19   [provider]  ?Blood Glucose Monitoring Suppl (ACCU-CHEK GUIDE ME) w/Device KIT USE AS DIRECTED 09/19/21   Elsie Stain, MD  ?Calcium Carbonate-Vitamin D (OYSTER SHELL CALCIUM/D) 500-5 MG-MCG TABS Take 1 tablet by mouth daily.    [provider]  ?cetirizine (ZYRTEC) 5 MG tablet Take 1 tablet (5 mg total) by mouth daily. ?Patient not taking: Reported on 10/20/2021 03/13/21   Jaynee Eagles, PA-C  ?cholecalciferol (VITAMIN D3) 25 MCG (1000 UNIT) tablet Take 2,000 Units by mouth daily.    [provider]  ?enalapril (VASOTEC) 5 MG tablet Take 5 mg by mouth daily. 09/28/18   [provider]  ?finasteride (PROSCAR) 5 MG tablet TAKE 1 TABLET EVERY DAY 09/19/21   Elsie Stain, MD  ?fluticasone [facility]) 50 MCG/ACT nasal spray Place 2 sprays into both nostrils daily as needed for allergies.    [provider]  ?glucose blood (ACCU-CHEK GUIDE) test strip USE TO CHECK BLOOD SUGAR ONCE DAILY. 09/19/21   Elsie Stain, MD  ?levothyroxine (SYNTHROID) 25 MCG tablet Take  25 mcg by mouth daily before breakfast. 12/03/18   [provider]  ?magnesium oxide (MAG-OX) 400 MG tablet Take 400 mg by mouth daily. 12/13/18   [provider]  ?metFORMIN (GLUCOPHAGE-XR) 500 MG 24 hr tablet Take 2 tablets (1,000 mg total) by mouth 2 (two) times daily. 09/19/21   Elsie Stain, MD  ?metoprolol tartrate (LOPRESSOR) 25 MG tablet Take 1 tablet (25 mg total) by mouth 2 (two) times daily. 10/21/21   Patrecia Pour, MD  ?simvastatin (ZOCOR) 10 MG tablet Take 10 mg by mouth daily. 09/28/18   [provider]  ?verapamil (CALAN-SR) 240 MG CR tablet Take 240 mg by mouth daily. 12/11/18    [provider]  ?  ? ?Allergies:    ?Allergies  ?Allergen Reactions  ? Levonorgestrel-Ethinyl Estrad Other (See Comments)  ?  Sneezing, eyes watering, nose running ?Sneezing, eyes watering, nose running  ? ? ?Social History:   ?Social History  ? ?Socioeconomic History  ? Marital status: Married  ?  Spouse name: Not on file  ? Number of children: Not on file  ? Years of education: Not on file  ? Highest education level: Not on file  ?Occupational History  ? Not on file  ?Tobacco Use  ? Smoking status: Never  ? Smokeless tobacco: Never  ?Vaping Use  ? Vaping Use: Never used  ?Substance and Sexual Activity  ? Alcohol use: Not Currently  ? Drug use: Not Currently  ? Sexual activity: Not Currently  ?Other Topics Concern  ? Not on file  ?Social History Narrative  ? Not on file  ? ?Social Determinants of Health  ? ?Financial Resource Strain: Not on file  ?Food Insecurity: Not on file  ?Transportation Needs: Not on file  ?Physical Activity: Not on file  ?Stress: Not on file  ?Social Connections: Not on file  ?Intimate Partner Violence: Not on file  ?  ?Family History:  noncontributory ?The patient's family history is not on file.   ? ?ROS:  ?Please see the history of present illness.  ?All other ROS reviewed and negative.    ? ?Physical Exam/Data:  ? ?Vitals:  ? 10/27/21 2045 10/27/21 2100 10/27/21 2111 10/27/21 2112  ?BP: 90/60     ?Pulse: (!) 42 (!) 49 (!) 51 (!) 52  ?Resp: 17 17 19  (!) 25  ?Temp:      ?SpO2: 99% 98% 98% 98%  ? ? ?Intake/Output Summary (Last 24 hours) at 10/27/2021 2145 ?Last data filed at 10/27/2021 2112 ?Gross per 24 hour  ?Intake 208.33 ml  ?Output --  ?Net 208.33 ml  ? ? ?  10/20/2021  ?  9:45 PM 10/20/2021  ?  1:53 PM 07/03/2021  ? 11:35 AM  ?Last 3 Weights  ?Weight (lbs) 160 lb 6.4 oz 160 lb 161 lb 3.2 oz  ?Weight (kg) 72.757 kg 72.576 kg 73.12 kg  ?   ?There is no height or weight on file to calculate BMI.  ?General:  Well nourished, well developed, in no acute distress ?HEENT: normal ?Neck:  no JVD ?Vascular: No carotid bruits; Distal pulses 2+ bilaterally   ?Cardiac:  normal S1, S2; RRR; systolic murmur present ?Lungs:  clear to auscultation bilaterally, no wheezing, rhonchi or rales  ?Abd: soft, nontender, no hepatomegaly  ?Ext: no edema ?Musculoskeletal:  No deformities, BUE and BLE strength normal and equal ?Skin: warm and dry  ?Neuro:  CNs 2-12 intact, no focal abnormalities noted ?Psych:  Normal affect  ? ? ?EKG:  The ECG that was done  was personally reviewed and demonstrates complete heart block ? ?Relevant CV Studies: ?none ? ?Laboratory Data: ? ?High Sensitivity Troponin:   ?Recent Labs  ?Lab 10/27/21 ?2050  ?TROPONINIHS 285*  ?    ?Chemistry ?Recent Labs  ?Lab 10/21/21 ?0310 10/27/21 ?2050  ?NA 134* 138  ?K 4.4 4.6  ?CL 106 107  ?CO2 20* 22  ?GLUCOSE 189* 237*  ?BUN 18 19  ?CREATININE 1.04 1.28*  ?CALCIUM 8.4* 9.4  ?MG  --  1.8  ?GFRNONAA >60 56*  ?ANIONGAP 8 9  ?  ?Recent Labs  ?Lab 10/27/21 ?2050  ?PROT 6.2*  ?ALBUMIN 3.3*  ?AST 34  ?ALT 25  ?ALKPHOS 43  ?BILITOT 0.9  ? ?Lipids No results for input(s): CHOL, TRIG, HDL, LABVLDL, LDLCALC, CHOLHDL in the last 168 hours. ?Hematology ?Recent Labs  ?Lab 10/21/21 ?0310 10/27/21 ?2050  ?WBC 6.3 9.5  ?RBC 4.86 4.64  ?HGB 14.8 14.5  ?HCT 42.8 42.7  ?MCV 88.1 92.0  ?MCH 30.5 31.3  ?MCHC 34.6 34.0  ?RDW 13.6 13.8  ?PLT 159 263  ? ?Thyroid No results for input(s): TSH, FREET4 in the last 168 hours. ?BNPNo results for input(s): BNP, PROBNP in the last 168 hours.  ?DDimer No results for input(s): DDIMER in the last 168 hours. ? ? ?Radiology/Studies:  ?DG Chest Portable 1 View ? ?Result Date: 10/27/2021 ?CLINICAL DATA:  Dizziness. EXAM: PORTABLE CHEST 1 VIEW COMPARISON:  None. FINDINGS: There are multiple overlying radiopaque cardiac lead wires. The cardiac silhouette is mildly enlarged and unchanged in size. There is mild calcification of the aortic arch. Mild left perihilar and left infrahilar infiltrate is seen. This is mildly decreased in severity when  compared to the prior study. There is no evidence of a pleural effusion or pneumothorax. The visualized skeletal structures are unremarkable. IMPRESSION: Mild left perihilar and left infrahilar infiltrat

## 2021-10-27 NOTE — ED Notes (Signed)
EMS on scene at this time. Patient is being discharged from the Urgent Care and sent to the Emergency Department via EMS . Per Milus Mallick, patient is in need of higher level of care due to symptomatic bradycardia. Patient is aware and verbalizes understanding of plan of care.  ?Vitals:  ? 10/27/21 1929  ?BP: 105/63  ?Pulse: (!) 38  ?Resp: 16  ?Temp: 97.6 ?F (36.4 ?C)  ?SpO2: 96%  ? ? ?

## 2021-10-28 DIAGNOSIS — R001 Bradycardia, unspecified: Secondary | ICD-10-CM | POA: Diagnosis not present

## 2021-10-28 LAB — BASIC METABOLIC PANEL
Anion gap: 8 (ref 5–15)
BUN: 17 mg/dL (ref 8–23)
CO2: 23 mmol/L (ref 22–32)
Calcium: 9.3 mg/dL (ref 8.9–10.3)
Chloride: 106 mmol/L (ref 98–111)
Creatinine, Ser: 1.01 mg/dL (ref 0.61–1.24)
GFR, Estimated: >60 mL/min (ref >60)
Glucose, Bld: 116 mg/dL — ABNORMAL HIGH (ref 70–99)
Potassium: 3.8 mmol/L (ref 3.5–5.1)
Sodium: 137 mmol/L (ref 135–145)

## 2021-10-28 LAB — CBC
HCT: 39 % (ref 39.0–52.0)
Hemoglobin: 13.7 g/dL (ref 13.0–17.0)
MCH: 31.1 pg (ref 26.0–34.0)
MCHC: 35.1 g/dL (ref 30.0–36.0)
MCV: 88.6 fL (ref 80.0–100.0)
Platelets: 203 10*3/uL (ref 150–400)
RBC: 4.4 MIL/uL (ref 4.22–5.81)
RDW: 13.5 % (ref 11.5–15.5)
WBC: 7.2 10*3/uL (ref 4.0–10.5)
nRBC: 0 % (ref 0.0–0.2)

## 2021-10-28 LAB — TROPONIN I (HIGH SENSITIVITY): Troponin I (High Sensitivity): 258 ng/L (ref <18)

## 2021-10-28 LAB — HEMOGLOBIN A1C
Hgb A1c MFr Bld: 5.7 % — ABNORMAL HIGH (ref 4.8–5.6)
Mean Plasma Glucose: 116.89 mg/dL

## 2021-10-28 LAB — PROTIME-INR
INR: 1.6 — ABNORMAL HIGH (ref 0.8–1.2)
Prothrombin Time: 18.5 seconds — ABNORMAL HIGH (ref 11.4–15.2)

## 2021-10-28 LAB — GLUCOSE, CAPILLARY
Glucose-Capillary: 145 mg/dL — ABNORMAL HIGH (ref 70–99)
Glucose-Capillary: 105 mg/dL — ABNORMAL HIGH (ref 70–99)
Glucose-Capillary: 162 mg/dL — ABNORMAL HIGH (ref 70–99)
Glucose-Capillary: 125 mg/dL — ABNORMAL HIGH (ref 70–99)

## 2021-10-28 LAB — T4, FREE: Free T4: 1.07 ng/dL (ref 0.61–1.12)

## 2021-10-28 LAB — TSH: TSH: 2.802 u[IU]/mL (ref 0.350–4.500)

## 2021-10-28 LAB — BRAIN NATRIURETIC PEPTIDE: B Natriuretic Peptide: 931.1 pg/mL — ABNORMAL HIGH (ref 0.0–100.0)

## 2021-10-28 NOTE — Progress Notes (Addendum)
? ?Progress Note ? ?Patient Name: Edward Butler ?Date of Encounter: 10/28/2021 ? ?[facility] Cardiologist: Dina Rich, MD  ? ?Subjective  ? ?Feeling well. No chest pain, sob or palpitations.   ? ?Inpatient Medications  ?  ?Scheduled Meds: ? apixaban  5 mg Oral BID  ? finasteride  5 mg Oral Daily  ? levothyroxine  25 mcg Oral QAC breakfast  ? simvastatin  10 mg Oral Daily  ? ?Continuous Infusions: ? ?PRN Meds: ?acetaminophen, ondansetron (ZOFRAN) IV  ? ?Vital Signs  ?  ?Vitals:  ? 10/28/21 0615 10/28/21 0630 10/28/21 0645 10/28/21 0700  ?BP: 97/63 (!) 121/48 (!) 112/57 (!) 104/56  ?Pulse: 64 60 (!) 59 (!) 59  ?Resp: (!) 22 (!) 22 20 (!) 22  ?Temp: 98.3 ?F (36.8 ?C)     ?TempSrc: Oral     ?SpO2: 97% 93% 94% 93%  ? ? ?Intake/Output Summary (Last 24 hours) at 10/28/2021 0727 ?Last data filed at 10/27/2021 2112 ?Gross per 24 hour  ?Intake 208.33 ml  ?Output --  ?Net 208.33 ml  ? ? ?  10/20/2021  ?  9:45 PM 10/20/2021  ?  1:53 PM 07/03/2021  ? 11:35 AM  ?Last 3 Weights  ?Weight (lbs) 160 lb 6.4 oz 160 lb 161 lb 3.2 oz  ?Weight (kg) 72.757 kg 72.576 kg 73.12 kg  ?   ? ?Telemetry  ?  ?Sinus rhythm at rate of 60s - Personally Reviewed ? ?ECG  ? ?N/A ? ?Physical Exam  ? ?GEN: No acute distress.   ?Neck: No JVD ?Cardiac: RRR, no murmurs, rubs, or gallops.  ?Respiratory: Clear to auscultation bilaterally. ?GI: Soft, nontender, non-distended  ?MS: No edema; No deformity. ?Neuro:  Nonfocal  ?Psych: Normal affect  ? ?Labs  ?  ?High Sensitivity Troponin:   ?Recent Labs  ?Lab 10/27/21 ?2050 10/27/21 ?2301  ?TROPONINIHS 285* 258*  ?   ?Chemistry ?Recent Labs  ?Lab 10/27/21 ?2050 10/28/21 ?0452  ?NA 138 137  ?K 4.6 3.8  ?CL 107 106  ?CO2 22 23  ?GLUCOSE 237* 116*  ?BUN 19 17  ?CREATININE 1.28* 1.01  ?CALCIUM 9.4 9.3  ?MG 1.8  --   ?PROT 6.2*  --   ?ALBUMIN 3.3*  --   ?AST 34  --   ?ALT 25  --   ?ALKPHOS 43  --   ?BILITOT 0.9  --   ?GFRNONAA 56* >60  ?ANIONGAP 9 8  ?  ?Hematology ?Recent Labs  ?Lab 10/27/21 ?2050 10/28/21 ?0452  ?WBC  9.5 7.2  ?RBC 4.64 4.40  ?HGB 14.5 13.7  ?HCT 42.7 39.0  ?MCV 92.0 88.6  ?MCH 31.3 31.1  ?MCHC 34.0 35.1  ?RDW 13.8 13.5  ?PLT 263 203  ? ?Thyroid  ?Recent Labs  ?Lab 10/28/21 ?6283  ?TSH 2.802  ?FREET4 1.07  ?  ?BNP ?Recent Labs  ?Lab 10/28/21 ?1517  ?BNP 931.1*  ?  ? ?Radiology  ?  ?DG Chest Portable 1 View ? ?Result Date: 10/27/2021 ?CLINICAL DATA:  Dizziness. EXAM: PORTABLE CHEST 1 VIEW COMPARISON:  None. FINDINGS: There are multiple overlying radiopaque cardiac lead wires. The cardiac silhouette is mildly enlarged and unchanged in size. There is mild calcification of the aortic arch. Mild left perihilar and left infrahilar infiltrate is seen. This is mildly decreased in severity when compared to the prior study. There is no evidence of a pleural effusion or pneumothorax. The visualized skeletal structures are unremarkable. IMPRESSION: Mild left perihilar and left infrahilar infiltrate, mildly decreased in severity when compared  to the prior study. Electronically Signed   By: Virgina Norfolk M.D.   On: 10/27/2021 21:05   ? ?Cardiac Studies  ? ?Outside hospital echo 03/2021 ?SUMMARY  ?The left ventricular size is normal.  ?Mild left ventricular hypertrophy  ?LV ejection fraction = 55-60%.  ?Left ventricular systolic function is normal.  ?The right ventricle is normal in size and function.  ?The left atrium is moderately dilated.  ?There is moderate to severe aortic stenosis.  ?There is moderate to severe mitral annular calcification.  ?There is mild mitral regurgitation.  ?The mean gradient across the mitral valve is 3.9 mmHg.  ?The heart rate for the mean mitral valve gradient is 70 BPM.  ?There was insufficient TR detected to calculate RV systolic pressure.  ?The inferior vena cava was not visualized during the exam.  ?There is no pericardial effusion.  ?There is no comparison study available.  ? ?Doppler Measurements & Calculations  ?MV E max vel:      MV V2 max:       MV dec time: SV(LVOT): 99.3 ml  ?147.9  cm/sec       153.6 cm/sec     0.30 sec     Ao V2 max:  ?MV A max vel:      MV max PG:                    417.7 cm/sec  ?142.1 cm/sec       9.4 mmHg                      Ao max PG: 69.8 mmHg  ?MV E/A: 1.0        MV V2 mean:                   Ao V2 mean:  ?Med Peak E' Vel:   91.9 cm/sec                   298.3 cm/sec  ?4.5 cm/sec         MV mean PG:                   Ao mean PG: 39.5 mmHg  ?Lat Peak E' Vel:   3.9 mmHg                      Ao V2 VTI: 95.6 cm  ?4.6 cm/sec         MV V2 VTI:  ?E/Lat E`: 32.4     50.8 cm                       AVA (VTI): 1.0 cm2  ?E/Med E`: 33.2  ?                   MVA(VTI): 2.0 cm2  ? ?Patient Profile  ?   ?81 y.o. male with hx of aortic stenosis, HTN, PAF, DM, HLD, hypothyroidism and BPH admitted with symptomatic bradycardia.  ? ?Assessment & Plan  ?  ?Symptomatic bradycardia  ?- Recent afib RVR in setting of pneumonia. Discharged on metoprolol tartrate '25mg'$  BID (new) with home Verapamil '240mg'$  CD.  ?- Now admitted with symptomatic bradycardia in 30-40s. HR improved after atropine.  ?- Last dose of BB and CCB AM of 3/27>>> now HR improved to 60s. Felling much better.  ?- TSH normal ? ?2. PAF ?-As above ?-Now off rate control agent. Suspects HR  will go up if afib exacerbation. Will need close monitor and may be eventual pacemaker of tachy- brady. No current indication.  ?- Continue Eliquis  ? ?3. Elevated troponin  ?- 285>>258 ?- Lactic acid improved after hydration ?- Likely demand ischemia due to symptomatic bradycardia and hypotension  ? ?4. Aortic stenosis ?- Moderate to severe by last echo in 03/2021.  ?- Will need updated echo soon ?- No syncope ? ?5. Hypotension with hx of HTN ?- BP improving. Holding BB and CCB as above ? ?6. HLD ?- Continue statin  ? ?Has follow up with regular cardiologist at [facility] 12/10/21. ? ?For questions or updates, please contact [place] ?Please consult [email or web address] for contact info under  ? ?  ?   ?Signed, ?Leanor Kail, PA  ?10/28/2021,  7:27 AM    ? ?Patient seen and examined   I agree with findings as noted by B Bhagat above  ?Pt is comfortable Denies dizziness    Breathing is OK ? ?ON exam ?Pt in NAD ?Lungs are CTA ?Cardiac RRR  III/VI systolic murmur LSB to base  ?Ext are without edema ? ?1  Bradycardia    On recent admit he was started on metoprolol 25 bid    Was already on diltiazem    With the combination after several days he became symptomatic with braadycardia    ?Now both stopped   HR acceptable in SR ? ?I feel he will continue to have problems with high/low HR if he goes in /  out of afib     Follow for now      No indication for pacer unless recurs afib requring meds  ?Instructed the pt to set down if feels dizzy so does not pass out and fall ? ?2  Elevated troponin   Most likely demand ischemia in setting of bradycardia, hypotension and aortic stenosis    ? ?3  Aortic stenosis   Moderate inAUg 2022   I have ordered an echo to reevaluate      Again, AS makes him particularly sensitive to both fast and slow Heart rates . ? ?4  PAF   IN SR now    Keep on anticoagulation   Will review echo    For now I would not consider an antiarrhythmic    ? ?Dorris Carnes MD  ?

## 2021-10-28 NOTE — Plan of Care (Signed)

## 2021-10-28 NOTE — TOC Progression Note (Signed)
Transition of Care (TOC) - Progression Note  ? ? ?Patient Details  ?Name: Edward Butler ?MRN: [id number] ?Date of Birth: November 20, 1940 ? ?Transition of Care (TOC) CM/SW Contact  ?Zenon Mayo, RN ?Phone Number: ?10/28/2021, 8:12 AM ? ?Clinical Narrative:    ? ?Transition of Care (TOC) Screening Note ? ? ?Patient Details  ?Name: Edward Butler ?Date of Birth: 10-30-1940 ? ? ?Transition of Care (TOC) CM/SW Contact:    ?Zenon Mayo, RN ?Phone Number: ?10/28/2021, 8:12 AM ? ? ? ?Transition of Care Department [facility]) has reviewed patient and no TOC needs have been identified at this time. We will continue to monitor patient advancement through interdisciplinary progression rounds. If new patient transition needs arise, please place a TOC consult. ?  ? ? ?  ?  ? ?Expected Discharge Plan and Services ?  ?  ?  ?  ?  ?                ?  ?  ?  ?  ?  ?  ?  ?  ?  ?  ? ? ?Social Determinants of Health (SDOH) Interventions ?  ? ?Readmission Risk Interventions ?   ? View : No data to display.  ?  ?  ?  ? ? ?

## 2021-10-29 ENCOUNTER — Observation Stay (HOSPITAL_COMMUNITY): Payer: Medicare HMO

## 2021-10-29 ENCOUNTER — Observation Stay (HOSPITAL_BASED_OUTPATIENT_CLINIC_OR_DEPARTMENT_OTHER)
Admit: 2021-10-29 | Discharge: 2021-10-29 | Disposition: A | Discharge status: Home / Self Care | Payer: Medicare HMO | Attending: Physician Assistant | Admitting: Physician Assistant

## 2021-10-29 ENCOUNTER — Observation Stay (HOSPITAL_BASED_OUTPATIENT_CLINIC_OR_DEPARTMENT_OTHER): Payer: Medicare HMO

## 2021-10-29 ENCOUNTER — Telehealth: Payer: Self-pay | Admitting: Internal Medicine

## 2021-10-29 DIAGNOSIS — I480 Paroxysmal atrial fibrillation: Secondary | ICD-10-CM

## 2021-10-29 DIAGNOSIS — I350 Nonrheumatic aortic (valve) stenosis: Secondary | ICD-10-CM | POA: Diagnosis not present

## 2021-10-29 DIAGNOSIS — R001 Bradycardia, unspecified: Secondary | ICD-10-CM

## 2021-10-29 DIAGNOSIS — I10 Essential (primary) hypertension: Secondary | ICD-10-CM | POA: Diagnosis not present

## 2021-10-29 DIAGNOSIS — I495 Sick sinus syndrome: Secondary | ICD-10-CM

## 2021-10-29 DIAGNOSIS — I351 Nonrheumatic aortic (valve) insufficiency: Secondary | ICD-10-CM

## 2021-10-29 LAB — ECHOCARDIOGRAM COMPLETE
AR max vel: 0.94 cm2
AV Area VTI: 0.99 cm2
AV Area mean vel: 0.9 cm2
AV Mean grad: 36.5 mmHg
AV Peak grad: 60.5 mmHg
Ao pk vel: 3.89 m/s
Area-P 1/2: 2.29 cm2
Height: 67.008 in
MV M vel: 4.68 m/s
MV Peak grad: 87.5 mmHg
P 1/2 time: 610 msec
S' Lateral: 2.8 cm
Weight: 2449.6 oz

## 2021-10-29 LAB — GLUCOSE, CAPILLARY: Glucose-Capillary: 101 mg/dL — ABNORMAL HIGH (ref 70–99)

## 2021-10-29 MED ORDER — PERFLUTREN LIPID MICROSPHERE
1.0000 mL | INTRAVENOUS | Status: AC | PRN
  Administered 2021-10-29: 2 mL via INTRAVENOUS
  Filled 2021-10-29: qty 10

## 2021-10-29 NOTE — Progress Notes (Signed)
? ?Progress Note ? ?Patient Name: Edward Butler ?Date of Encounter: 10/29/2021 ? ?[facility] Cardiologist: Dina Rich, MD  ? ?Subjective  ? ?No CP  No dizziness   No SOB  ? ?Inpatient Medications  ?  ?Scheduled Meds: ? apixaban  5 mg Oral BID  ? finasteride  5 mg Oral Daily  ? levothyroxine  25 mcg Oral QAC breakfast  ? simvastatin  10 mg Oral Daily  ? ?Continuous Infusions: ? ?PRN Meds: ?acetaminophen, ondansetron (ZOFRAN) IV  ? ?Vital Signs  ?  ?Vitals:  ? 10/28/21 1700 10/28/21 1941 10/29/21 0008 10/29/21 0340  ?BP: 120/64 122/62 (!) 116/57 127/66  ?Pulse: 67 62 63 (!) 57  ?Resp: '18 18 18 18  '$ ?Temp: 98.3 ?F (36.8 ?C) 98.5 ?F (36.9 ?C) 97.9 ?F (36.6 ?C) 98.1 ?F (36.7 ?C)  ?TempSrc: Oral Oral Oral Oral  ?SpO2:  96% 97% 100%  ?Weight:    69.4 kg  ?Height:      ? ? ?Intake/Output Summary (Last 24 hours) at 10/29/2021 0601 ?Last data filed at 10/28/2021 1839 ?Gross per 24 hour  ?Intake 354 ml  ?Output 850 ml  ?Net -496 ml  ? ? ?  10/29/2021  ?  3:40 AM 10/28/2021  ?  8:58 AM 10/20/2021  ?  9:45 PM  ?Last 3 Weights  ?Weight (lbs) 153 lb 1.6 oz 175 lb 4.3 oz 160 lb 6.4 oz  ?Weight (kg) 69.446 kg 79.5 kg 72.757 kg  ?   ? ?Telemetry  ?  ?Sinus rhythm at rate of 60s - Personally Reviewed ? ?ECG  ? ?N/A ? ?Physical Exam  ? ?GEN: No acute distress.   ?Neck: No JVD ?Cardiac: RRR, no murmurs, ?Respiratory: Clear to auscultation bilaterally. ?GI: Soft, nontender, non-distended  ?MS: No edema; No deformity. ?Neuro:  Nonfocal  ?Psych: Normal affect  ? ?Labs  ?  ?High Sensitivity Troponin:   ?Recent Labs  ?Lab 10/27/21 ?2050 10/27/21 ?2301  ?TROPONINIHS 285* 258*  ?   ?Chemistry ?Recent Labs  ?Lab 10/27/21 ?2050 10/28/21 ?0452  ?NA 138 137  ?K 4.6 3.8  ?CL 107 106  ?CO2 22 23  ?GLUCOSE 237* 116*  ?BUN 19 17  ?CREATININE 1.28* 1.01  ?CALCIUM 9.4 9.3  ?MG 1.8  --   ?PROT 6.2*  --   ?ALBUMIN 3.3*  --   ?AST 34  --   ?ALT 25  --   ?ALKPHOS 43  --   ?BILITOT 0.9  --   ?GFRNONAA 56* >60  ?ANIONGAP 9 8  ?  ?Hematology ?Recent Labs  ?Lab  10/27/21 ?2050 10/28/21 ?0452  ?WBC 9.5 7.2  ?RBC 4.64 4.40  ?HGB 14.5 13.7  ?HCT 42.7 39.0  ?MCV 92.0 88.6  ?MCH 31.3 31.1  ?MCHC 34.0 35.1  ?RDW 13.8 13.5  ?PLT 263 203  ? ?Thyroid  ?Recent Labs  ?Lab 10/28/21 ?2440  ?TSH 2.802  ?FREET4 1.07  ?  ?BNP ?Recent Labs  ?Lab 10/28/21 ?1027  ?BNP 931.1*  ?  ? ?Radiology  ?  ?DG Chest Portable 1 View ? ?Result Date: 10/27/2021 ?CLINICAL DATA:  Dizziness. EXAM: PORTABLE CHEST 1 VIEW COMPARISON:  None. FINDINGS: There are multiple overlying radiopaque cardiac lead wires. The cardiac silhouette is mildly enlarged and unchanged in size. There is mild calcification of the aortic arch. Mild left perihilar and left infrahilar infiltrate is seen. This is mildly decreased in severity when compared to the prior study. There is no evidence of a pleural effusion or pneumothorax. The visualized skeletal structures  are unremarkable. IMPRESSION: Mild left perihilar and left infrahilar infiltrate, mildly decreased in severity when compared to the prior study. Electronically Signed   By: Virgina Norfolk M.D.   On: 10/27/2021 21:05   ? ?Cardiac Studies  ? ?Outside hospital echo 03/2021 ?SUMMARY  ?The left ventricular size is normal.  ?Mild left ventricular hypertrophy  ?LV ejection fraction = 55-60%.  ?Left ventricular systolic function is normal.  ?The right ventricle is normal in size and function.  ?The left atrium is moderately dilated.  ?There is moderate to severe aortic stenosis.  ?There is moderate to severe mitral annular calcification.  ?There is mild mitral regurgitation.  ?The mean gradient across the mitral valve is 3.9 mmHg.  ?The heart rate for the mean mitral valve gradient is 70 BPM.  ?There was insufficient TR detected to calculate RV systolic pressure.  ?The inferior vena cava was not visualized during the exam.  ?There is no pericardial effusion.  ?There is no comparison study available.  ? ?Doppler Measurements & Calculations  ?MV E max vel:      MV V2 max:       MV  dec time: SV(LVOT): 99.3 ml  ?147.9 cm/sec       153.6 cm/sec     0.30 sec     Ao V2 max:  ?MV A max vel:      MV max PG:                    417.7 cm/sec  ?142.1 cm/sec       9.4 mmHg                      Ao max PG: 69.8 mmHg  ?MV E/A: 1.0        MV V2 mean:                   Ao V2 mean:  ?Med Peak E' Vel:   91.9 cm/sec                   298.3 cm/sec  ?4.5 cm/sec         MV mean PG:                   Ao mean PG: 39.5 mmHg  ?Lat Peak E' Vel:   3.9 mmHg                      Ao V2 VTI: 95.6 cm  ?4.6 cm/sec         MV V2 VTI:  ?E/Lat E`: 32.4     50.8 cm                       AVA (VTI): 1.0 cm2  ?E/Med E`: 33.2  ?                   MVA(VTI): 2.0 cm2  ? ?Patient Profile  ?   ?81 y.o. male with hx of aortic stenosis, HTN, PAF, DM, HLD, hypothyroidism and BPH admitted with symptomatic bradycardia.  ? ?Assessment & Plan  ?  ?Symptomatic bradycardia  ?- Recent afib RVR in setting of pneumonia. Discharged on metoprolol tartrate '25mg'$  BID (new) with home Verapamil '240mg'$  CD.  ?- Now admitted with symptomatic bradycardia in 30-40s. HR improved after atropine.  ?- Last dose of BB and CCB AM of 3/27>>> now HR improved 60s. Felling much better.  No pauses     ?- TSH normal ?Plan to follow as outpt   Will set up with live montir ?Pt cautioned if feels dizzy   Sit down    ?WIll notify primary cardiologist of events    ? ?2. PAF ?-As above ?-Now off rate control agent. Suspects HR will go up if afib exacerbation. Will need close monitor and may be eventual pacemaker of tachy- brady. No current indication.  ? ?- Continue Eliquis  ? ?3. Elevated troponin  ?- 285>>258 ?- Lactic acid improved after hydration ?- Likely demand ischemia due to symptomatic bradycardia and hypotension  ? ?4. Aortic stenosis ?- Moderate to severe by last echo in 03/2021.  ?Echo today   ? ? ?5. Hypotension   Pt currently off of cardiazem    Off of lopressor   BP is good   Will need to be followed as outpt  ? ? ?6. HLD ?- Continue statin  ? ?Has follow up with  regular cardiologist at [facility] 12/10/21.  Will call to get sooner  ?Probable d/c today  ? ?For questions or updates, please contact [place] ?Please consult [email or web address] for contact info under  ? ?  ?   ?Signed, ?Dorris Carnes, MD  ?10/29/2021, 6:01 AM    ?

## 2021-10-29 NOTE — Telephone Encounter (Signed)
Notified by [employer] that the patient has returned to atrial fibrillation with an average HR of 127. He was just discharged earlier today after presenting with bradycardia in the setting of dual therapy with CCB and BB, both of which were discontinued. Given modest rate and multiple recent medication changes, I have notified Dr. Marlou Porch and Harrington Challenger (discharging MD) and will defer choice of therapy to them.  ?

## 2021-10-29 NOTE — Progress Notes (Signed)
2D echocardiogram completed. ? ?10/29/2021 11:18 AM ?Kelby Aline., MHA, RVT, RDCS, RDMS   ?

## 2021-10-29 NOTE — Care Management Obs Status (Signed)
MEDICARE OBSERVATION STATUS NOTIFICATION ? ? ?Patient Details  ?Name: Edward Butler ?MRN: [id number] ?Date of Birth: 04-08-1941 ? ? ?Medicare Observation Status Notification Given:  Yes ? ? ? ?Carles Collet, RN ?10/29/2021, 9:39 AM ?

## 2021-10-29 NOTE — Progress Notes (Signed)
Mobility Specialist Progress Note: ? ? 10/29/21 1020  ?Mobility  ?Activity Ambulated independently in room  ?Level of Assistance Independent  ?Assistive Device None  ?Distance Ambulated (ft) 100 ft  ?Activity Response Tolerated well  ?$Mobility charge 1 Mobility  ? ?Pt ambulated independently with no mobility needs. Back in room d/t transport here for ECHO. ? ?Nelta Numbers ?Acute Rehab ?Phone: [phone] ?Office Phone: [phone] ? ?

## 2021-10-29 NOTE — Discharge Summary (Signed)
?Discharge Summary  ?  ?Patient ID: Edward Butler ?MRN: [id number]; DOB: 1941/02/25 ? ?Admit date: 10/27/2021 ?Discharge date: 10/29/2021 ? ?PCP:  Elsie Stain, MD ?  ?[facility] ?Cardiologist:  Dina Rich, MD   >>> Dr. Marlou Porch (patient decided to follow-up with [facility]) ? ? ?Discharge Diagnoses  ?  ?Principal Problem: ?  Symptomatic bradycardia ?Paroxysmal atrial fibrillation ?Moderate to severe aortic stenosis ?Hypertension with history of hypertension ?Hyperlipidemia ?Elevated troponin ? ?Diagnostic Studies/Procedures  ?  ?Echocardiogram 10/29/21 ?1. The aortic valve is abnormal. There is severe calcifcation of the  ?aortic valve. Aortic valve regurgitation is mild. Moderate to severe  ?aortic valve stenosis. Aortic valve area, by VTI measures 0.99 cm?Marland Kitchen Aortic  ?valve mean gradient measures 36.5 mmHg.  ?Aortic valve Vmax measures 3.89 m/s.  ? 2. Left ventricular ejection fraction, by estimation, is 65 to 70%. The  ?left ventricle has normal function. The left ventricle has no regional  ?wall motion abnormalities. There is moderate asymmetric left ventricular  ?hypertrophy of the basal-septal  ?segment. Left ventricular diastolic parameters are consistent with Grade I  ?diastolic dysfunction (impaired relaxation).  ? 3. Right ventricular systolic function is normal. The right ventricular  ?size is normal. There is normal pulmonary artery systolic pressure. The  ?estimated right ventricular systolic pressure is 75.1 mmHg.  ? 4. Left atrial size was mild to moderately dilated.  ? 5. The mitral valve is degenerative. Mild mitral valve regurgitation. No  ?evidence of mitral stenosis.  ? 6. The inferior vena cava is normal in size with greater than 50%  ?respiratory variability, suggesting right atrial pressure of 3 mmHg.  ?  ?History of Present Illness   ?  ?Edward Butler is a 81 y.o. male with hx of aortic stenosis, HTN, PAF, DM, HLD, hypothyroidism, obstructive sleep apnea on CPAP and BPH  admitted with symptomatic bradycardia. ? ?He followed regularly by Dr.Bohle at [facility] with paroxysmal atrial fibrillation on Eliquis, hypertension, moderate aortic stenosis. ? ?Never smoked. ?No early family history of coronary artery disease. ?  ?Admitted 10/20/21-10/21/21 for community-acquired pneumonia and atrial fibrillation with rapid ventricular rate.  Felt elevated heart rate precipitated by pneumonia.  Converted to sinus rhythm.  Added metoprolol tartrate 25 mg twice daily on top of regular verapamil 240 mg daily.  Discharged on p.o. antibiotic. ? ?After discharge he was recovering well with some episode of lightheadedness.  Had worsening episode 3/27.  EMS was called and found to be in atrial fibrillation with slow ventricular rate in 30s.  He was given fluids in route.  Upon arrival to ER, he was given atropine and felt much better afterwards.  Heart rate improved to 50s with blood pressure of 101/58.  Lactic acid 3.2.  Admitted under cardiology service for further evaluation. ? ?Hospital Course  ?   ?Consultants: None ? ?Symptomatic bradycardia  ?- Recent afib RVR in setting of pneumonia. Discharged on metoprolol tartrate '25mg'$  BID (new) with home Verapamil '240mg'$  CD. Admitted with symptomatic bradycardia in 30-40s. HR improved after atropine.  Heart rate remained stable in 60s without recurrent bradycardia or dizziness. ?- TSH normal ?-Echocardiogram normal LV function ?-Discontinued metoprolol and verapamil.  Dr. Harrington Challenger did not felt need of short acting rate control agent.  Placed on live monitor for further evaluation and discharge. ?  ?2. PAF ?-Maintaining sinus rhythm throughout admission.  Discontinued rate control agent as above. ?- Continue Eliquis  ?  ?3. Elevated troponin  ?- 285>>258 ?- Lactic acid improved after hydration ?-  Likely demand ischemia due to symptomatic bradycardia and hypotension  ?  ?4. Aortic stenosis ?- Moderate to severe by last echo in 03/2021.  ?-Echo this admission  showed LV function of 65 to 70% and grade 1 diastolic dysfunction.  No regional wall motion abnormality.  Moderate to severe aortic stenosis with mean gradient of 36.5 mmHg. Aortic valve area, by VTI measures 0.99 cm?.  ? ? ?5. Hypotension with hx of HTN ?- BP improved with holding BB and CCB as above.  Hydration was given.  Recommended to resume ACE when systolic blood pressure greater then 120. ?  ?6. HLD ?- Continue statin   ? ?7.  Recent pneumonia ?-Repeat chest x-ray prior to discharge showed no acute finding.  Resolution of pneumonia. ? ?Patient wants to follow-up with our service with Dr. Marlou Porch for easy transportation. ? ?The patient been seen by Dr. Harrington Challenger today and deemed ready for discharge home. All follow-up appointments have been scheduled. Discharge medications are listed below.   ? ?Did the patient have an acute coronary syndrome (MI, NSTEMI, STEMI, etc) this admission?:  No                               ?Did the patient have a percutaneous coronary intervention (stent / angioplasty)?:  No.   ? ?  ? ?Discharge Vitals ?Blood pressure 107/61, pulse 63, temperature 98.5 ?F (36.9 ?C), temperature source Oral, resp. rate 19, height 5' 7.01" (1.702 m), weight 69.4 kg, SpO2 100 %.  ?Filed Weights  ? 10/28/21 0858 10/29/21 0340  ?Weight: 69.5 kg 69.4 kg  ? ? ?Labs & Radiologic Studies  ?  ?CBC ?Recent Labs  ?  10/27/21 ?2050 10/28/21 ?0452  ?WBC 9.5 7.2  ?NEUTROABS 6.5  --   ?HGB 14.5 13.7  ?HCT 42.7 39.0  ?MCV 92.0 88.6  ?PLT 263 203  ? ?Basic Metabolic Panel ?Recent Labs  ?  10/27/21 ?2050 10/28/21 ?0452  ?NA 138 137  ?K 4.6 3.8  ?CL 107 106  ?CO2 22 23  ?GLUCOSE 237* 116*  ?BUN 19 17  ?CREATININE 1.28* 1.01  ?CALCIUM 9.4 9.3  ?MG 1.8  --   ? ?Liver Function Tests ?Recent Labs  ?  10/27/21 ?2050  ?AST 34  ?ALT 25  ?ALKPHOS 43  ?BILITOT 0.9  ?PROT 6.2*  ?ALBUMIN 3.3*  ? ?No results for input(s): LIPASE, AMYLASE in the last 72 hours. ?High Sensitivity Troponin:   ?Recent Labs  ?Lab 10/27/21 ?2050  10/27/21 ?2301  ?TROPONINIHS 285* 258*  ?  ? ?Hemoglobin A1C ?Recent Labs  ?  10/28/21 ?0452  ?HGBA1C 5.7*  ? ? ?Thyroid Function Tests ?Recent Labs  ?  10/28/21 ?0452  ?TSH 2.802  ? ?_____________  ?DG Chest 2 View ? ?Result Date: 10/29/2021 ?CLINICAL DATA:  Hypertension. EXAM: CHEST - 2 VIEW COMPARISON:  10/27/2021 FINDINGS: The lungs are clear without focal pneumonia, edema, pneumothorax or pleural effusion. Trace atelectasis or scarring noted at the bases. Mild hyperexpansion. The cardiopericardial silhouette is within normal limits for size. The visualized bony structures of the thorax are unremarkable. Telemetry leads overlie the chest. IMPRESSION: No active cardiopulmonary disease. Electronically Signed   By: Misty Stanley M.D.   On: 10/29/2021 09:23  ? ?DG Chest 2 View ? ?Result Date: 10/21/2021 ?CLINICAL DATA:  Fever, cough EXAM: CHEST - 2 VIEW COMPARISON:  10/20/2021 FINDINGS: Transverse diameter of heart is increased. Infiltrate is seen in the left parahilar region  with no significant interval change. Infiltrate appears to be in the anterior aspect of left mid lung fields in the left upper lobe. There are no new focal infiltrates or signs of alveolar pulmonary edema. There is blunting of left lateral CP angle. There is no pneumothorax. IMPRESSION: Infiltrate in the left parahilar region has not changed. This may suggest pneumonia in the left upper lobe. Follow-up studies until complete clearing occurs should be considered to rule out any underlying neoplastic process. Small left pleural effusion is seen. Electronically Signed   By: Elmer Picker M.D.   On: 10/21/2021 08:19  ? ?DG Chest Portable 1 View ? ?Result Date: 10/27/2021 ?CLINICAL DATA:  Dizziness. EXAM: PORTABLE CHEST 1 VIEW COMPARISON:  None. FINDINGS: There are multiple overlying radiopaque cardiac lead wires. The cardiac silhouette is mildly enlarged and unchanged in size. There is mild calcification of the aortic arch. Mild left perihilar  and left infrahilar infiltrate is seen. This is mildly decreased in severity when compared to the prior study. There is no evidence of a pleural effusion or pneumothorax. The visualized skeletal structures are unremark

## 2021-10-29 NOTE — TOC Transition Note (Addendum)
Transition of Care (TOC) - CM/SW Discharge Note ? ? ?Patient Details  ?Name: Edward Butler ?MRN: [id number] ?Date of Birth: 06-18-1941 ? ?Transition of Care (TOC) CM/SW Contact:  ?Zenon Mayo, RN ?Phone Number: ?10/29/2021, 1:41 PM ? ? ?Clinical Narrative:    ?Patient to dc home today, has no needs  Cards putting on zio patch in the room at bedside. ? ? ?Final next level of care: Home/Self Care ?Barriers to Discharge: No Barriers Identified ? ? ?Patient Goals and CMS Choice ?  ?  ?  ? ?Discharge Placement ?  ?           ?  ?  ?  ?  ? ?Discharge Plan and Services ?  ?  ?           ?  ?  ?  ?  ?  ?  ?  ?  ?  ?  ? ?Social Determinants of Health (SDOH) Interventions ?  ? ? ?Readmission Risk Interventions ?   ? View : No data to display.  ?  ?  ?  ? ? ? ? ? ?

## 2021-10-30 ENCOUNTER — Telehealth: Payer: Self-pay

## 2021-10-30 DIAGNOSIS — I495 Sick sinus syndrome: Secondary | ICD-10-CM | POA: Diagnosis not present

## 2021-10-30 DIAGNOSIS — I480 Paroxysmal atrial fibrillation: Secondary | ICD-10-CM | POA: Diagnosis not present

## 2021-10-30 DIAGNOSIS — R001 Bradycardia, unspecified: Secondary | ICD-10-CM | POA: Diagnosis not present

## 2021-10-30 NOTE — Telephone Encounter (Signed)
Transition Care Management Follow-up Telephone Call ?Date of discharge and from where: 10/29/2021, [facility]  ?How have you been since you were released from the hospital? He said he is feeling great.  ?Any questions or concerns? Yes- at his next visit he would like to know if he needs another shingles vaccine  ?He has been using a CPAP machine and said he may need a new one.  The machine he has cuts on and off.  He also noted that when he was in the hospital he did not use a CPAP machine and was fine. He said his last sleep study was about 10 years ago  ? ?Items Reviewed: ?Did the pt receive and understand the discharge instructions provided? Yes  ?Medications obtained and verified? Yes  - he said he has all of his medications and did not have any questions about the med regime. He also said that he understands what medications he is to stop taking.  ?Other? No  ?Any new allergies since your discharge? No  ?Dietary orders reviewed? Yes ?Do you have support at home? Yes , his daughter ? ?Home Care and Equipment/Supplies: ?Were home health services ordered? no ?If so, what is the name of the agency? N/a  ?Has the agency set up a time to come to the patient's home? not applicable ?Were any new equipment or medical supplies ordered?  No ?What is the name of the medical supply agency? N/a ?Were you able to get the supplies/equipment? not applicable ?Do you have any questions related to the use of the equipment or supplies? No ? ?He has a glucometer and BP monitor as well as a CPAP machine  ?Currently has Zio monitor ? ?Functional Questionnaire: (I = Independent and D = Dependent) ?ADLs: independent ? ? ?Follow up appointments reviewed: ? ?PCP Hospital f/u appt confirmed? Yes  Scheduled to see Dr Joya Gaskins- 11/20/2021. ?[facility] f/u appt confirmed? Yes  Scheduled to see cardiology - 11/19/2021.  ?Are transportation arrangements needed? No  ?If their condition worsens, is the pt aware to call PCP or go to  the Emergency Dept.? Yes ?Was the patient provided with contact information for the PCP's office or ED? Yes ?Was to pt encouraged to call back with questions or concerns? Yes ? ?

## 2021-10-30 NOTE — Telephone Encounter (Signed)
Contacted patient   He felt OK with higher heart rates     ?No dizziness  No SOB ?Feels OK now  ?Take  1/2 to 1 metoprolol if feels real SOB with rapid rates  ?Will make follow up with EP   Pt wants to be seen here in [place], not [place]. ?Spoke with C[place] who will schedule  ?

## 2021-11-01 LAB — CULTURE, BLOOD (ROUTINE X 2)
Culture: NO GROWTH
Culture: NO GROWTH

## 2021-11-03 ENCOUNTER — Ambulatory Visit: Payer: Medicare HMO | Admitting: Critical Care Medicine

## 2021-11-05 DIAGNOSIS — H906 Mixed conductive and sensorineural hearing loss, bilateral: Secondary | ICD-10-CM | POA: Diagnosis not present

## 2021-11-19 ENCOUNTER — Ambulatory Visit: Payer: Medicare HMO | Admitting: Physician Assistant

## 2021-11-19 NOTE — Addendum Note (Signed)
Encounter addended by: Gerarda Gunther on: 11/19/2021 8:10 AM ? Actions taken: Imaging Exam ended

## 2021-11-20 ENCOUNTER — Ambulatory Visit: Payer: Medicare HMO | Attending: Critical Care Medicine | Admitting: Critical Care Medicine

## 2021-11-20 ENCOUNTER — Encounter: Payer: Self-pay | Admitting: Critical Care Medicine

## 2021-11-20 ENCOUNTER — Telehealth: Payer: Self-pay

## 2021-11-20 VITALS — BP 129/78 | HR 72 | Resp 16 | Wt 156.4 lb

## 2021-11-20 DIAGNOSIS — E785 Hyperlipidemia, unspecified: Secondary | ICD-10-CM

## 2021-11-20 DIAGNOSIS — E039 Hypothyroidism, unspecified: Secondary | ICD-10-CM | POA: Diagnosis not present

## 2021-11-20 DIAGNOSIS — R001 Bradycardia, unspecified: Secondary | ICD-10-CM

## 2021-11-20 DIAGNOSIS — H66001 Acute suppurative otitis media without spontaneous rupture of ear drum, right ear: Secondary | ICD-10-CM

## 2021-11-20 DIAGNOSIS — I10 Essential (primary) hypertension: Secondary | ICD-10-CM | POA: Diagnosis not present

## 2021-11-20 DIAGNOSIS — I350 Nonrheumatic aortic (valve) stenosis: Secondary | ICD-10-CM | POA: Diagnosis not present

## 2021-11-20 DIAGNOSIS — N400 Enlarged prostate without lower urinary tract symptoms: Secondary | ICD-10-CM

## 2021-11-20 DIAGNOSIS — Z9989 Dependence on other enabling machines and devices: Secondary | ICD-10-CM

## 2021-11-20 DIAGNOSIS — G4733 Obstructive sleep apnea (adult) (pediatric): Secondary | ICD-10-CM

## 2021-11-20 DIAGNOSIS — J189 Pneumonia, unspecified organism: Secondary | ICD-10-CM

## 2021-11-20 DIAGNOSIS — B351 Tinea unguium: Secondary | ICD-10-CM | POA: Diagnosis not present

## 2021-11-20 DIAGNOSIS — E119 Type 2 diabetes mellitus without complications: Secondary | ICD-10-CM | POA: Diagnosis not present

## 2021-11-20 DIAGNOSIS — Z7901 Long term (current) use of anticoagulants: Secondary | ICD-10-CM

## 2021-11-20 DIAGNOSIS — I480 Paroxysmal atrial fibrillation: Secondary | ICD-10-CM

## 2021-11-20 NOTE — Patient Instructions (Signed)
No change in medications ? ?Get a zoster vaccine which is a shingles vaccine at your [facility] ? ?You are not due another COVID booster until June ? ?No change in medications ? ?DMV form issued ? ?Hemoglobin A1c will be checked in the lab ? ?Return to see Dr. Joya Gaskins 3 months ?

## 2021-11-20 NOTE — Assessment & Plan Note (Signed)
A1c was normal in March repeat again today, since discharge patient's had blood sugars in the 1 80-200 range ? ?Continue metformin ?

## 2021-11-20 NOTE — Assessment & Plan Note (Signed)
Blood pressure well controlled at this time on low-dose lisinopril continue same ?

## 2021-11-20 NOTE — Assessment & Plan Note (Signed)
Improved with discontinuation of beta-blocker and calcium channel blockers monitor per cardiology ?

## 2021-11-20 NOTE — Assessment & Plan Note (Signed)
X-ray shows clearance of pneumonia this has resolved ?

## 2021-11-20 NOTE — Assessment & Plan Note (Signed)
Patient on CPAP therapy tolerating well no need for repeat studies continue same ?

## 2021-11-20 NOTE — Assessment & Plan Note (Signed)
Last thyroid function stable continue Synthroid ?

## 2021-11-20 NOTE — Assessment & Plan Note (Signed)
History of paroxysmal atrial fibrillation currently now in sinus rhythm off heart rate controlling medications has follow-up with cardiology continue Eliquis ?

## 2021-11-20 NOTE — Assessment & Plan Note (Signed)
Continue simvastatin last lipid panel was stable ?

## 2021-11-20 NOTE — Assessment & Plan Note (Signed)
Follow-up per cardiology ?

## 2021-11-20 NOTE — Assessment & Plan Note (Signed)
Significant onychomycosis of both great toenails since the patient has diabetes and instructed him to see the podiatry and to avoid any type of trimming of the nails on his own as this would present high risk for infection referral was made ?

## 2021-11-20 NOTE — Assessment & Plan Note (Signed)
No recent falls patient tolerating apixaban well ?

## 2021-11-20 NOTE — Progress Notes (Signed)
? ?Established Patient Office Visit ? ?Subjective:  ?Patient ID: Edward Butler, male    DOB: 20-Feb-1941  Age: 81 y.o. MRN: [id number] ? ?CC:  ?Chief Complaint  ?Patient presents with  ? Hospitalization Follow-up  ? ? ?HPI ? ?07/2021 ?Edward Butler presents for primary care  follow-up visit.  Note this patient had an episode about a week ago of falling while assisting his daughter at her home.  He landed on concrete and had abrasions on the nose forehead right lower extremity.  He treated these with first-aid at home did not seek attention.  Note he is on Eliquis.  He is denied any headaches nausea or vomiting since that time.  He was coming down with upper respiratory tract infection at that time and this progressed to the point where he was seen 2 days ago at urgent care and given a prescription for amoxicillin.  They felt he had right ear infection and early sinus infection. ? ?On arrival blood pressure 119/82 hemoglobin A1c is 6.1 although his blood sugar on arrival was 249.  He brings a carefully produce record of all of his blood sugars and including what meals he ate and what type of food he was eating.  It is important note that for the last week his postprandial blood sugars have been in the 200+ range whereas prior to this they had only been in the 1 60-1 80 range.  Fasting sugars had been in the 08/23/1928 range but now more recent in the 1 40-1 60 range.  ? ?The patient is already had his flu vaccine for the season he has no other complaints ? ?11/20/2021 ?Patient returns in follow-up after having been hospitalized twice in March below are documentations from discharge summaries. ?Note patient has had completion of all of his pneumonia vaccines there was a question whether he needed a Pneumovax but he received this already in November 2021. ? ?Patient had atrial fibrillation and pneumonia of the pneumonia cleared out however when the atrial fibrillation was treated he became bradycardic had to be readmitted.   Patient is now off all heart rate controller therapy and he has an upcoming appointment with cardiology. ? ?Patient needs a foot exam at this visit. ? ?Patient is requesting a DMV handicap placard. ? ?Patient complains of toenail fungus. ? ?Patient questioning whether needs a shingles vaccine we determined he had 2 vaccines for zoster in 2019 and 2020. ? ?Patient only has a slight cough at this time.  He does use CPAP at night.  He questions whether he can come off the CPAP machine. ? ? ?Below are the discharge summaries. ?Admit date:     10/20/2021  ?Discharge date: 10/21/21  ?Discharge Physician: Patrecia Pour  ?  ?PCP: Elsie Stain, MD  ?  ?Recommendations at discharge:  ?Follow up with [facility] cardiology, Dr. Salvadore Oxford for PAF. Treated here for AFib w/RVR which converted to NSR with addition of metoprolol '25mg'$  po BID. ?Follow up with PCP, Dr. Joya Gaskins as scheduled 4/20 with plans to repeat CXR at that time after trial of antibiotics for presumptive pneumonia. Cautioned patient on importance of this follow up to rule out persistent opacity which would necessitate further work up for possible cancer. ?  ?Discharge Diagnoses: ?Principal Problem: ?  Paroxysmal atrial fibrillation with RVR ([place]) ?Active Problems: ?  CAP (community acquired pneumonia) ?  Benign essential HTN ?  Type 2 diabetes mellitus without complication, without long-term current use of insulin ([place]) ?  Hypothyroidism ?  Dyslipidemia ?  Aortic stenosis, moderate ?  OSA on CPAP ?  BPH (benign prostatic hyperplasia) ?  ?Hospital Course: ?Edward Butler is an 81 y.o. male with a history of PAF on eliquis, AS, CVA, T2DM, HLD, OSA on CPAP who presented to the ED 3/20 with cough and fever. He was febrile to 101.2?F, in AFib with RVR and not hypoxic. CXR demonstrated left and possible right perihilar density suspicious for pneumonia in setting of fever, though inconclusive. Antibiotics, IV fluid were administered. Cardiology consulted and patient  initiated on diltiazem infusion with eventual conversion to NSR around 4:20am the following morning. He remained in sinus rhythm without fever and has no hypoxia or dyspnea on exertion. He will be discharged in stable condition with plans for PCP and cardiology follow up and completion of oral antibiotics and addition of metoprolol. ?  ?Assessment and Plan: ?* Paroxysmal atrial fibrillation with RVR ([place]) ?RVR is asymptomatic, precipitated by pneumonia. Converted to NSR on diltiazem infusion. Restarted home diltiazem per cardiology who also added metoprolol tartrate '25mg'$  po BID.  ?- Continue eliquis, hasn't missed doses ?  ?CAP (community acquired pneumonia) ?Presumptive diagnosis with CXR opacity, acute productive cough and fever. Viral work up negative. Urine antigen studies negative thus far.  ?- Sputum culture pending, blood cultures NGTD. Will monitor after discharge.  ?- Complete typical antibiotic course and repeat CXR at follow up to ensure resolution. Pt also has no subacute/chronic systemic symptoms of malignancy. Discussed he may need CT chest if still opacity at follow up. ?  ?Type 2 diabetes mellitus without complication, without long-term current use of insulin ([place]) ?HbA1c 6.1% on 07/2021. Continue metformin. Also on ACEi and statin at home. ?  ?Benign essential HTN ?BP normotensive, continue enalapril, new metoprolol, and home diltiazem CR ?  ?Hypothyroidism ?TSH wnl. ?Continue home synthroid   ?  ?Dyslipidemia ?Continue zocor  ?  ?Aortic stenosis, moderate ?Moderate AS based off last echo from [place] note with his cardiologist.  ?  ?OSA on CPAP ?On cpap  ?  ?BPH (benign prostatic hyperplasia) ?Continue proscar  ?  ?Consultants: Cardiology, Edward [place]. ?Procedures performed: None ? ?Admit date: 10/27/2021 ?Discharge date: 10/29/2021 ?  ?PCP:  Elsie Stain, MD ?             ?[facility] Providers ?Cardiologist:  Dina Rich, MD   >>> [place] (patient decided to follow-up with [facility]) ?   ?  ?Discharge Diagnoses  ?  ?Principal Problem: ?  Symptomatic bradycardia ?Paroxysmal atrial fibrillation ?Moderate to severe aortic stenosis ?Hypertension with history of hypertension ?Hyperlipidemia ?Elevated troponin ?  ?Diagnostic Studies/Procedures  ?  ?Echocardiogram 10/29/21 ?1. The aortic valve is abnormal. There is severe calcifcation of the  ?aortic valve. Aortic valve regurgitation is mild. Moderate to severe  ?aortic valve stenosis. Aortic valve area, by VTI measures 0.99 cm?Marland Kitchen Aortic  ?valve mean gradient measures 36.5 mmHg.  ?Aortic valve Vmax measures 3.89 m/s.  ? 2. Left ventricular ejection fraction, by estimation, is 65 to 70%. The  ?left ventricle has normal function. The left ventricle has no regional  ?wall motion abnormalities. There is moderate asymmetric left ventricular  ?hypertrophy of the basal-septal  ?segment. Left ventricular diastolic parameters are consistent with Grade I  ?diastolic dysfunction (impaired relaxation).  ? 3. Right ventricular systolic function is normal. The right ventricular  ?size is normal. There is normal pulmonary artery systolic pressure. The  ?estimated right ventricular systolic pressure is 89.1 mmHg.  ? 4. Left atrial size  was mild to moderately dilated.  ? 5. The mitral valve is degenerative. Mild mitral valve regurgitation. No  ?evidence of mitral stenosis.  ? 6. The inferior vena cava is normal in size with greater than 50%  ?respiratory variability, suggesting right atrial pressure of 3 mmHg.  ?  ?History of Present Illness   ?  ?Derry Kassel is a 81 y.o. male with hx of aortic stenosis, HTN, PAF, DM, HLD, hypothyroidism, obstructive sleep apnea on CPAP and BPH admitted with symptomatic bradycardia. ?  ?He followed [facility] by [facility] with paroxysmal atrial fibrillation on Eliquis, hypertension, moderate aortic stenosis. ?  ?Never smoked. ?No early family history of coronary artery disease. ?  ?Admitted 10/20/21-10/21/21 for  community-acquired pneumonia and atrial fibrillation with rapid ventricular rate.  Felt elevated heart rate precipitated by pneumonia.  Converted to sinus rhythm.  Added metoprolol tartrate 25 mg twice daily o

## 2021-11-20 NOTE — Assessment & Plan Note (Signed)
This has resolved.

## 2021-11-20 NOTE — Assessment & Plan Note (Signed)
Continue Proscar   

## 2021-11-20 NOTE — Telephone Encounter (Signed)
Received monitor report for patient.  ? ?Placed on Vin's desk for review. ?

## 2021-11-21 ENCOUNTER — Encounter: Payer: Self-pay | Admitting: Internal Medicine

## 2021-11-21 ENCOUNTER — Ambulatory Visit (INDEPENDENT_AMBULATORY_CARE_PROVIDER_SITE_OTHER): Payer: Medicare HMO | Admitting: Internal Medicine

## 2021-11-21 VITALS — BP 110/70 | HR 78 | Ht 67.0 in | Wt 158.0 lb

## 2021-11-21 DIAGNOSIS — R001 Bradycardia, unspecified: Secondary | ICD-10-CM | POA: Diagnosis not present

## 2021-11-21 DIAGNOSIS — I350 Nonrheumatic aortic (valve) stenosis: Secondary | ICD-10-CM | POA: Diagnosis not present

## 2021-11-21 DIAGNOSIS — I480 Paroxysmal atrial fibrillation: Secondary | ICD-10-CM

## 2021-11-21 DIAGNOSIS — Z01812 Encounter for preprocedural laboratory examination: Secondary | ICD-10-CM

## 2021-11-21 LAB — HEMOGLOBIN A1C
Est. average glucose Bld gHb Est-mCnc: 123 mg/dL
Hgb A1c MFr Bld: 5.9 % — ABNORMAL HIGH (ref 4.8–5.6)

## 2021-11-21 MED ORDER — VERAPAMIL HCL ER 240 MG PO TBCR: 240.0000 mg | EXTENDED_RELEASE_TABLET | Freq: Every day | ORAL | 3 refills | Status: DC

## 2021-11-21 NOTE — Progress Notes (Signed)
? ? ? ? ?ELECTROPHYSIOLOGY CONSULT NOTE  ?Patient ID: Edward Butler, MRN: [id number], DOB/AGE: 1940/09/03 81 y.o. ?Admit date: (Not on file) ?Date of Consult: 11/21/2021 ? ?Primary Physician: Elsie Stain, MD ?Primary Cardiologist: [facility] ?  ?  ?Edward Butler is a 81 y.o. male who is being seen today for the evaluation of atrial fib at the request of OR.  ? ? ?HPI ?Edward Butler is a 81 y.o. 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 ? ?Admitted 3/23 with pneumonia and developed atrial fibrillation with a rapid rate and converted spontaneously..  Was discharged on verapamil to 40 and adjunctive metoprolol 25 twice daily and was admitted 3/23 with symptomatic bradycardia with heart rates in the 30s and 40s.  Beta-blockers and calcium blockers were discontinued. ? ?Event recorder was applied and personally reviewed.  Atrial fibrillation burden was about 26% with mean heart rates around 140-150 and excursion up to the 170s/180s.  Off AV nodal blocking agents his sinus rates were in the 70s ? ?Reviewing notes 12/22 antecedent history of atrial fibrillation ? ? ? ?DATE TEST EF   ?8/22 Echo   55-60 %  AS moderate-severe  ?3/23 Echo    65-70 % Ao Mean Grad 36 ?LVH-asymmetric 15/8 mm  ?     ? ?Date Cr K Hgb  ?3/23 1.01 3.8 13.7  ?      ? ?Thromboembolic risk factors ( age -45, HTN-1, DM-1) for a CHADSVASc Score of >=4 (has a history of a intracranial aneurysm about 40 years ago) ? ?[place]; his wife is in a nursing home in the [place] area, dementia, knows only him.  No longer even her daughters. ? ?Past Medical History:  ?Diagnosis Date  ? Aneurysm ([place])   ? Atrial fibrillation ([place])   ? CAP (community acquired pneumonia) 10/20/2021  ? Diabetes mellitus without complication ([place])   ? Hypertension   ? Non-recurrent acute suppurative otitis media of right ear without spontaneous rupture of tympanic membrane 07/04/2021  ? Stroke  [facility])   ?   ? ?Surgical History:  ?Past Surgical History:  ?Procedure Laterality Date  ? skin cancer surgeries     ?  ? ?Home Meds: ?Current Meds  ?Medication Sig  ? Accu-Chek FastClix Lancets MISC Use to check blood sugar once daily. E11.9  ? apixaban (ELIQUIS) 5 MG TABS tablet Take 5 mg by mouth 2 (two) times daily.  ? Blood Glucose Monitoring Suppl (ACCU-CHEK GUIDE ME) w/Device KIT USE AS DIRECTED  ? Calcium Carbonate-Vitamin D (OYSTER SHELL CALCIUM/D) 500-5 MG-MCG TABS Take 1 tablet by mouth daily.  ? cholecalciferol (VITAMIN D3) 25 MCG (1000 UNIT) tablet Take 2,000 Units by mouth daily.  ? enalapril (VASOTEC) 5 MG tablet Take 5 mg by mouth daily.  ? finasteride (PROSCAR) 5 MG tablet TAKE 1 TABLET EVERY DAY  ? fluticasone (FLONASE) 50 MCG/ACT nasal spray Place 2 sprays into both nostrils daily as needed for allergies.  ? glucose blood (ACCU-CHEK GUIDE) test strip USE TO CHECK BLOOD SUGAR ONCE DAILY.  ? levothyroxine (SYNTHROID) 25 MCG tablet Take 25 mcg by mouth daily before breakfast.  ? magnesium oxide (MAG-OX) 400 MG tablet Take 400 mg by mouth daily.  ? metFORMIN (GLUCOPHAGE-XR) 500 MG 24 hr tablet Take 2 tablets (1,000 mg total) by mouth 2 (two) times daily.  ? simvastatin (ZOCOR) 10 MG tablet Take 10 mg by mouth daily.  ? ? ?Allergies:  ?Allergies  ?  Allergen Reactions  ? Levonorgestrel-Ethinyl Estrad Other (See Comments)  ?  Sneezing, eyes watering, nose running ?Sneezing, eyes watering, nose running  ? ? ?Social History  ? ?Socioeconomic History  ? Marital status: Married  ?  Spouse name: Not on file  ? Number of children: Not on file  ? Years of education: Not on file  ? Highest education level: Not on file  ?Occupational History  ? Not on file  ?Tobacco Use  ? Smoking status: Never  ? Smokeless tobacco: Never  ?Vaping Use  ? Vaping Use: Never used  ?Substance and Sexual Activity  ? Alcohol use: Not Currently  ? Drug use: Not Currently  ? Sexual activity: Not Currently  ?Other Topics Concern  ? Not on  file  ?Social History Narrative  ? Not on file  ? ?Social Determinants of Health  ? ?Financial Resource Strain: Not on file  ?Food Insecurity: Not on file  ?Transportation Needs: Not on file  ?Physical Activity: Not on file  ?Stress: Not on file  ?Social Connections: Not on file  ?Intimate Partner Violence: Not on file  ?  ? ?No family history on file.  ? ?ROS:  Please see the history of present illness.     All other systems reviewed and negative.  ? ? ?Physical Exam: ?Blood pressure 110/70, pulse 78, height _0  (1.702 m), weight 158 lb (71.7 kg), SpO2 98 %. ?General: Well developed, well nourished male in no acute distress. ?Head: Normocephalic, atraumatic, sclera non-icteric, no xanthomas, nares are without discharge. ?EENT: normal  ?Lymph Nodes:  none ?Neck: Negative for carotid bruits. JVD not elevated.  Carotids were delayed ?Back:without scoliosis kyphosis ?Lungs: Clear bilaterally to auscultation without wheezes, rales, or rhonchi. Breathing is unlabored. ?Heart: RRR with S1 S2-single.  3/6 systolic murmur . No rubs, or gallops appreciated. ?Abdomen: Soft, non-tender, non-distended with normoactive bowel sounds. No hepatomegaly. No rebound/guarding. No obvious abdominal masses. ?Msk:  Strength and tone appear normal for age. ?Extremities: No clubbing or cyanosis. No  edema.  Distal pedal pulses are 2+ and equal bilaterally. ?Skin: Warm and Dry ?Neuro: Alert and oriented X 3. CN III-XII intact Grossly normal sensory and motor function . ?Psych:  Responds to questions appropriately with a normal affect. ?  ?  ?  ? ?EKG: Sinus at 78 ?Intervals 15/13/44 ?Right bundle branch block ? ? ?Assessment and Plan:  ?Atrial fibrillation-paroxysmal with a rapid ventricular rate ? ?Bradycardia associated with AV nodal blocking agents ? ?Aortic stenosis-moderate-severe (mean gradient 36+) ? ?Right bundle branch block ? 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  In the context  of his aortic stenosis I am surprised that he has had since few symptoms.  Efforts to control his rapid rates was associated with symptomatic bradycardia; hence, pacing for relief of his symptomatic bradycardia so as to allow treatment of his tachycardia is indicated.   ? ?Moreover, with his right bundle branch block and his aortic stenosis almost certainly he will come to TAVR, and with his right bundle branch block would be at high risk for pacing also. ? ?The benefits and risks were reviewed including but not limited to death,  perforation, infection, lead dislodgement and device malfunction.  The patient understands agrees and is willing to proceed. ? ?In the interim, we will resume his verapamil which she has been on for a long time at 240 mg a day.  We will hold off on the beta-blockers until which time he is  paced. ? ?We will arrange follow-up with Dr. Ysidro Evert for managing of his aortic stenosis ? ? ? ? ? ? ? ? ? ?[place]irl [place]  ?

## 2021-11-21 NOTE — Patient Instructions (Addendum)
Medication Instructions:  ?Your physician recommends that you continue on your current medications as directed. Please refer to the Current Medication list given to you today. ? ?** Restart Verapamil '240mg'$  - 1 tablet by mouth daily. ? ?*If you need a refill on your cardiac medications before your next appointment, please call your pharmacy* ? ? ?Lab Work: ?None ordered. today ?If you have labs (blood work) drawn today and your tests are completely normal, you will receive your results only by: ?MyChart Message (if you have MyChart) OR ?A paper copy in the mail ?If you have any lab test that is abnormal or we need to change your treatment, we will call you to review the results. ? ? ?Testing/Procedures: ?Your physician has recommended that you have a pacemaker inserted. A pacemaker is a small device that is placed under the skin of your chest or abdomen to help control abnormal heart rhythms. This device uses electrical pulses to prompt the heart to beat at a normal rate. Pacemakers are used to treat heart rhythms that are too slow. Wire (leads) are attached to the pacemaker that goes into the chambers of you heart. This is done in the hospital and usually requires and overnight stay. Please see the instruction sheet given to you today for more information. Pacemaker Implantation, Adult ?Pacemaker implantation is a procedure to place a pacemaker inside the chest. A pacemaker is a small computer that sends electrical signals to the heart and helps the heart beat normally. A pacemaker also stores information about heart rhythms. You may need pacemaker implantation if you have: ?A slow heartbeat (bradycardia). ?Loss of consciousness that happens repeatedly (syncope) or repeated episodes of dizziness or light-headedness because of an irregular heart rate. ?Shortness of breath (dyspnea) due to heart problems. ?The pacemaker usually attaches to your heart through a wire called a lead. One or two leads may be needed. There  are different types of pacemakers: ?Transvenous pacemaker. This type is placed under the skin or muscle of your upper chest area. The lead goes through a vein in the chest area to reach the inside of the heart. ?Epicardial pacemaker. This type is placed under the skin or muscle of your chest or abdomen. The lead goes through your chest to the outside of the heart. ?Tell a health care provider about: ?Any allergies you have. ?All medicines you are taking, including vitamins, herbs, eye drops, creams, and over-the-counter medicines. ?Any problems you or family members have had with anesthetic medicines. ?Any blood or bone disorders you have. ?Any surgeries you have had. ?Any medical conditions you have. ?Whether you are pregnant or may be pregnant. ?What are the risks? ?Generally, this is a safe procedure. However, problems may occur, including: ?Infection. ?Bleeding. ?Failure of the pacemaker or the lead. ?Collapse of a lung or bleeding into a lung. ?Blood clot inside a blood vessel with a lead. ?Damage to the heart. ?Infection inside the heart (endocarditis). ?Allergic reactions to medicines. ?What happens before the procedure? ?Staying hydrated ?Follow instructions from your health care provider about hydration, which may include: ?Up to 2 hours before the procedure - you may continue to drink clear liquids, such as water, clear fruit juice, black coffee, and plain tea. ? ?Eating and drinking restrictions ?Follow instructions from your health care provider about eating and drinking, which may include: ?8 hours before the procedure - stop eating heavy meals or foods, such as meat, fried foods, or fatty foods. ?6 hours before the procedure - stop eating light meals  or foods, such as toast or cereal. ?6 hours before the procedure - stop drinking milk or drinks that contain milk. ?2 hours before the procedure - stop drinking clear liquids. ?Medicines ?Ask your health care provider about: ?Changing or stopping your  regular medicines. This is especially important if you are taking diabetes medicines or blood thinners. ?Taking medicines such as aspirin and ibuprofen. These medicines can thin your blood. Do not take these medicines unless your health care provider tells you to take them. ?Taking over-the-counter medicines, vitamins, herbs, and supplements. ?Tests ?You may have: ?A heart evaluation. This may include: ?An electrocardiogram (ECG). This involves placing patches on your skin to check your heart rhythm. ?A chest X-ray. ?An echocardiogram. This is a test that uses sound waves (ultrasound) to produce an image of the heart. ?A cardiac rhythm monitor. This is used to record your heart rhythm and any events for a longer period of time. ?Blood tests. ?Genetic testing. ?General instructions ?Do not use any products that contain nicotine or tobacco for at least 4 weeks before the procedure. These products include cigarettes, e-cigarettes, and chewing tobacco. If you need help quitting, ask your health care provider. ?Ask your health care provider: ?How your surgery site will be marked. ?What steps will be taken to help prevent infection. These steps may include: ?Removing hair at the surgery site. ?Washing skin with a germ-killing soap. ?Receiving antibiotic medicine. ?Plan to have someone take you home from the hospital or clinic. ?If you will be going home right after the procedure, plan to have someone with you for 24 hours. ?What happens during the procedure? ?An IV will be inserted into one of your veins. ?You will be given one or more of the following: ?A medicine to help you relax (sedative). ?A medicine to numb the area (local anesthetic). ?A medicine to make you fall asleep (general anesthetic). ?The next steps vary depending on the type of pacemaker you will be getting. ?If you are getting a transvenous pacemaker: ?An incision will be made in your upper chest. ?A pocket will be made for the pacemaker. It may be  placed under the skin or between layers of muscle. ?The lead will be inserted into a blood vessel that goes to the heart. ?While X-rays are taken by an imaging machine (fluoroscopy), the lead will be advanced through the vein to the inside of your heart. ?The other end of the lead will be tunneled under the skin and attached to the pacemaker. ?If you are getting an epicardial pacemaker: ?An incision will be made near your ribs or breastbone (sternum) for the lead. ?The lead will be attached to the outside of your heart. ?Another incision will be made in your chest or upper abdomen to create a pocket for the pacemaker. ?The free end of the lead will be tunneled under the skin and attached to the pacemaker. ?The transvenous or epicardial pacemaker will be tested. Imaging studies may be done to check the lead position. ?The incisions will be closed with stitches (sutures), adhesive strips, or skin glue. ?Bandages (dressings) will be placed over the incisions. ?The procedure may vary among health care providers and hospitals. ?What happens after the procedure? ?Your blood pressure, heart rate, breathing rate, and blood oxygen level will be monitored until you leave the hospital or clinic. ?You may be given antibiotics. ?You will be given pain medicine. ?An ECG and chest X-rays will be done. ?You may need to wear a continuous type of ECG (Holter monitor)  to check your heart rhythm. ?Your health care provider will program the pacemaker. ?If you were given a sedative during the procedure, it can affect you for several hours. Do not drive or operate machinery until your health care provider says that it is safe. ?You will be given a pacemaker identification card. This card lists the implant date, device model, and manufacturer of your pacemaker. ?Summary ?A pacemaker is a small computer that sends electrical signals to the heart and helps the heart beat normally. ?There are different types of pacemakers. A pacemaker may be  placed under the skin or muscle of your chest or abdomen. ?Follow instructions from your health care provider about eating and drinking and about taking medicines before the procedure. ?This information is not

## 2021-11-24 ENCOUNTER — Telehealth: Payer: Self-pay

## 2021-11-24 NOTE — Telephone Encounter (Signed)
-----   Message from Elsie Stain, MD sent at 11/21/2021  7:15 AM EDT ----- ?Let mr [name] know his A1C is good 5.9  no medication changes ?

## 2021-11-24 NOTE — Telephone Encounter (Signed)
Pt was called and vm was left, Information has been sent to nurse pool.   

## 2021-11-25 ENCOUNTER — Telehealth: Payer: Self-pay | Admitting: Critical Care Medicine

## 2021-11-25 NOTE — Telephone Encounter (Signed)
Copied from [place] [id number]. Topic: General - Other ?>> Nov 24, 2021  5:21 PM Erick Blinks wrote: ?Reason for CRM: Pt returned missed call to the office, please advise ?

## 2021-11-25 NOTE — Telephone Encounter (Signed)
Patient states no call back is needed. Patient aware of lab results ?

## 2021-11-25 NOTE — Telephone Encounter (Signed)
Called pt and left vm  

## 2021-11-25 NOTE — Telephone Encounter (Signed)
Noted  

## 2021-12-02 ENCOUNTER — Encounter: Payer: Self-pay | Admitting: Podiatry

## 2021-12-02 ENCOUNTER — Ambulatory Visit: Payer: Medicare HMO | Admitting: Podiatry

## 2021-12-02 DIAGNOSIS — L600 Ingrowing nail: Secondary | ICD-10-CM | POA: Diagnosis not present

## 2021-12-02 DIAGNOSIS — L603 Nail dystrophy: Secondary | ICD-10-CM | POA: Diagnosis not present

## 2021-12-02 NOTE — Patient Instructions (Signed)

## 2021-12-02 NOTE — Progress Notes (Signed)
?  Subjective:  ?Patient ID: Edward Butler, male    DOB: August 06, 1940,   MRN: [id number] ? ?Chief Complaint  ?Patient presents with  ? Nail Problem  ?  Hallux right - thick and discolored x years, not sore, PCP wanted it checked  ? Diabetes  ?  Last a1c was 5.9  ? New Patient (Initial Visit)  ? ? ?81 y.o. male presents for concern of right hallux that is thick and discolored for many years. Relates it is not sore. States PCP sent him to have evaluated. He is diabetic and last A1c was 5.9  . Denies any other pedal complaints. Denies n/v/f/c.  ? ?PCP: Asencion Noble MD  ? ?Past Medical History:  ?Diagnosis Date  ? Aneurysm ([place])   ? Atrial fibrillation ([place])   ? CAP (community acquired pneumonia) 10/20/2021  ? Diabetes mellitus without complication ([place])   ? Hypertension   ? Non-recurrent acute suppurative otitis media of right ear without spontaneous rupture of tympanic membrane 07/04/2021  ? Stroke [facility])   ? ? ?Objective:  ?Physical Exam: ?Vascular: DP/PT pulses 2/4 bilateral. CFT <3 seconds. Normal hair growth on digits. No edema.  ?Skin. No lacerations or abrasions bilateral feet. Right hallux nail is thickened and discolored and slightly loose distally. Tender to palpaiton.  ?Musculoskeletal: MMT 5/5 bilateral lower extremities in DF, PF, Inversion and Eversion. Deceased ROM in DF of ankle joint.  ?Neurological: Sensation intact to light touch.  ? ?Assessment:  ? ?1. Onychodystrophy   ?2. Ingrown nail of great toe of right foot   ? ? ? ?Plan:  ?Patient was evaluated and treated and all questions answered. ?Patient requesting removal of ingrown nail today. Procedure below.  ?Discussed procedure and post procedure care and patient expressed understanding.  ?Will follow-up in 2 weeks for nail check or sooner if any problems arise.  ? ? ?Procedure:  ?Procedure: total Nail Avulsion of right hallux nail.   ?Surgeon: Lorenda Peck, DPM  ?Pre-op Dx: Ingrown toenail without infection ?Post-op: Same  ?Place of Surgery: Office  exam room.  ?Indications for surgery: Painful and ingrown toenail.  ? ? ?The patient is requesting removal of nail with chemical matrixectomy. Risks and complications were discussed with the patient for which they understand and written consent was obtained. Under sterile conditions a total of 3 mL of  1% lidocaine plain was infiltrated in a hallux block fashion. Once anesthetized, the skin was prepped in sterile fashion. A tourniquet was then applied. Next the entire right hallux was removed.  Next phenol was then applied under standard conditions and copiously irrigated. Silvadene was applied. A dry sterile dressing was applied. After application of the dressing the tourniquet was removed and there is found to be an immediate capillary refill time to the digit. The patient tolerated the procedure well without any complications. Post procedure instructions were discussed the patient for which he verbally understood. Follow-up in two weeks for nail check or sooner if any problems are to arise. Discussed signs/symptoms of infection and directed to call the office immediately should any occur or go directly to the emergency room. In the meantime, encouraged to call the office with any questions, concerns, changes symptoms. ? ? ?Lorenda Peck, DPM  ? ? ?

## 2021-12-15 ENCOUNTER — Encounter: Payer: Self-pay | Admitting: Podiatrist

## 2021-12-15 ENCOUNTER — Ambulatory Visit (INDEPENDENT_AMBULATORY_CARE_PROVIDER_SITE_OTHER): Payer: Medicare HMO | Admitting: Podiatrist

## 2021-12-15 DIAGNOSIS — L600 Ingrowing nail: Secondary | ICD-10-CM

## 2021-12-15 NOTE — Progress Notes (Signed)
Chief Complaint  ?Patient presents with  ? Ingrown Toenail  ?  Nail check   ?  ? ?HPI: Patient is 81 y.o. male who presents today for recheck left great toenail total nail removal procedure performed by Dr. Blenda Mounts.  Overall he states he is doing well.  He has been keeping it clean with soap and water and using Polysporin and a Band-Aid.  He states its not painful and appears to be healing. ? ? ?Allergies  ?Allergen Reactions  ? Levonorgestrel-Ethinyl Estrad Other (See Comments)  ?  Sneezing, eyes watering, nose running ?Sneezing, eyes watering, nose running  ? ? ?Review of systems is negative except as noted in the HPI.  Denies nausea/ vomiting/ fevers/ chills or night sweats.   Denies difficulty breathing, denies calf pain or tenderness ? ?Physical Exam ? ?Patient is awake, alert, and oriented x 3.  In no acute distress.   ? ?Vascular status is intact with palpable pedal pulses DP and PT bilateral and capillary refill time less than 3 seconds bilateral.  No edema or erythema noted.  ? ?Neurological exam reveals epicritic and protective sensation grossly intact bilateral.  ? ?Dermatological exam reveals excellent appearance status post total nail removal with chemical matrixectomy left hallux nail.  No redness, no swelling, no pus, no drainage, no sign of infection is noted. ? ?Musculoskeletal exam: Musculature intact with dorsiflexion, plantarflexion, inversion, eversion. Ankle and First MPJ joint range of motion normal.  ? ? ? ?Assessment: ?  ICD-10-CM   ?1. Ingrown nail of great toe of right foot  L60.0   ?  ? ? ? ?Plan: ?Discussed exam findings with the patient.  Recommended he continue wearing a Band-Aid during the day he may leave it open to air at night for the next 2 weeks after that he may discontinue use of the Band-Aid and it should go on to heal nicely..  If he notices any increased redness, swelling or sign of infection he will call otherwise he will be seen back as needed for follow-up. ? ?

## 2022-01-01 ENCOUNTER — Encounter: Payer: Self-pay | Admitting: Internal Medicine

## 2022-01-07 ENCOUNTER — Other Ambulatory Visit: Payer: Medicare HMO

## 2022-01-07 DIAGNOSIS — Z01812 Encounter for preprocedural laboratory examination: Secondary | ICD-10-CM | POA: Diagnosis not present

## 2022-01-07 DIAGNOSIS — I350 Nonrheumatic aortic (valve) stenosis: Secondary | ICD-10-CM

## 2022-01-07 DIAGNOSIS — I480 Paroxysmal atrial fibrillation: Secondary | ICD-10-CM

## 2022-01-07 DIAGNOSIS — R001 Bradycardia, unspecified: Secondary | ICD-10-CM | POA: Diagnosis not present

## 2022-01-07 LAB — CBC
Hematocrit: 45.4 % (ref 37.5–51.0)
Hemoglobin: 15.5 g/dL (ref 13.0–17.7)
MCH: 31 pg (ref 26.6–33.0)
MCHC: 34.1 g/dL (ref 31.5–35.7)
MCV: 91 fL (ref 79–97)
Platelets: 148 10*3/uL — ABNORMAL LOW (ref 150–450)
RBC: 5 x10E6/uL (ref 4.14–5.80)
RDW: 14.8 % (ref 11.6–15.4)
WBC: 5.9 10*3/uL (ref 3.4–10.8)

## 2022-01-07 LAB — BASIC METABOLIC PANEL
BUN/Creatinine Ratio: 22 (ref 10–24)
BUN: 21 mg/dL (ref 8–27)
CO2: 28 mmol/L (ref 20–29)
Calcium: 9.5 mg/dL (ref 8.6–10.2)
Chloride: 108 mmol/L — ABNORMAL HIGH (ref 96–106)
Creatinine, Ser: 0.95 mg/dL (ref 0.76–1.27)
Glucose: 126 mg/dL — ABNORMAL HIGH (ref 70–99)
Potassium: 4.6 mmol/L (ref 3.5–5.2)
Sodium: 141 mmol/L (ref 134–144)
eGFR: 80 mL/min/{1.73_m2} (ref >59)

## 2022-01-13 NOTE — Pre-Procedure Instructions (Signed)
Instructed patient on the following items: Arrival time 0630 Nothing to eat or drink after midnight No meds AM of procedure Responsible person to drive you home and stay with you for 24 hrs Wash with special soap night before and morning of procedure If on anti-coagulant drug instructions Eliquis- last dose 6/12 PM

## 2022-01-14 ENCOUNTER — Encounter (HOSPITAL_COMMUNITY)
Admission: RE | Disposition: A | Discharge status: Home / Self Care | Payer: Self-pay | Source: Home / Self Care | Attending: Internal Medicine

## 2022-01-14 ENCOUNTER — Ambulatory Visit (HOSPITAL_COMMUNITY)
Admission: RE | Admit: 2022-01-14 | Discharge: 2022-01-14 | Disposition: A | Discharge status: Home / Self Care | Payer: Medicare HMO | Attending: Internal Medicine | Admitting: Internal Medicine

## 2022-01-14 ENCOUNTER — Ambulatory Visit (HOSPITAL_COMMUNITY): Payer: Medicare HMO

## 2022-01-14 DIAGNOSIS — I495 Sick sinus syndrome: Secondary | ICD-10-CM | POA: Insufficient documentation

## 2022-01-14 DIAGNOSIS — I480 Paroxysmal atrial fibrillation: Secondary | ICD-10-CM | POA: Diagnosis not present

## 2022-01-14 DIAGNOSIS — I10 Essential (primary) hypertension: Secondary | ICD-10-CM | POA: Diagnosis not present

## 2022-01-14 DIAGNOSIS — E119 Type 2 diabetes mellitus without complications: Secondary | ICD-10-CM | POA: Insufficient documentation

## 2022-01-14 DIAGNOSIS — I700 Atherosclerosis of aorta: Secondary | ICD-10-CM | POA: Diagnosis not present

## 2022-01-14 DIAGNOSIS — M47814 Spondylosis without myelopathy or radiculopathy, thoracic region: Secondary | ICD-10-CM | POA: Diagnosis not present

## 2022-01-14 DIAGNOSIS — R001 Bradycardia, unspecified: Secondary | ICD-10-CM

## 2022-01-14 DIAGNOSIS — I517 Cardiomegaly: Secondary | ICD-10-CM | POA: Diagnosis not present

## 2022-01-14 DIAGNOSIS — I4510 Unspecified right bundle-branch block: Secondary | ICD-10-CM | POA: Insufficient documentation

## 2022-01-14 DIAGNOSIS — Z7984 Long term (current) use of oral hypoglycemic drugs: Secondary | ICD-10-CM | POA: Insufficient documentation

## 2022-01-14 DIAGNOSIS — I350 Nonrheumatic aortic (valve) stenosis: Secondary | ICD-10-CM | POA: Insufficient documentation

## 2022-01-14 DIAGNOSIS — Z950 Presence of cardiac pacemaker: Secondary | ICD-10-CM | POA: Diagnosis not present

## 2022-01-14 HISTORY — PX: PACEMAKER IMPLANT: EP1218

## 2022-01-14 LAB — GLUCOSE, CAPILLARY: Glucose-Capillary: 83 mg/dL (ref 70–99)

## 2022-01-14 SURGERY — PACEMAKER IMPLANT

## 2022-01-14 MED ORDER — ACETAMINOPHEN 325 MG PO TABS: 325.0000 mg | ORAL_TABLET | ORAL | Status: DC | PRN

## 2022-01-14 MED ORDER — MIDAZOLAM HCL 5 MG/5ML IJ SOLN
INTRAMUSCULAR | Status: DC | PRN
  Administered 2022-01-14 (×2): 1 mg via INTRAVENOUS

## 2022-01-14 MED ORDER — CEFAZOLIN SODIUM-DEXTROSE 2-4 GM/100ML-% IV SOLN
2.0000 g | INTRAVENOUS | Status: AC
  Administered 2022-01-14: 2 g via INTRAVENOUS

## 2022-01-14 MED ORDER — FENTANYL CITRATE (PF) 100 MCG/2ML IJ SOLN
INTRAMUSCULAR | Status: AC
  Filled 2022-01-14: qty 2

## 2022-01-14 MED ORDER — SODIUM CHLORIDE 0.9 % IV SOLN
INTRAVENOUS | Status: AC
  Filled 2022-01-14: qty 2

## 2022-01-14 MED ORDER — SODIUM CHLORIDE 0.9 % IV SOLN: INTRAVENOUS | Status: DC

## 2022-01-14 MED ORDER — HEPARIN (PORCINE) IN NACL 1000-0.9 UT/500ML-% IV SOLN
INTRAVENOUS | Status: AC
  Filled 2022-01-14: qty 500

## 2022-01-14 MED ORDER — SODIUM CHLORIDE 0.9 % IV SOLN
80.0000 mg | INTRAVENOUS | Status: AC
  Administered 2022-01-14: 80 mg

## 2022-01-14 MED ORDER — ONDANSETRON HCL 4 MG/2ML IJ SOLN
4.0000 mg | Freq: Four times a day (QID) | INTRAMUSCULAR | Status: DC | PRN
Start: 2022-01-14 — End: 2022-01-15

## 2022-01-14 MED ORDER — CEFAZOLIN SODIUM-DEXTROSE 2-4 GM/100ML-% IV SOLN
INTRAVENOUS | Status: AC
  Filled 2022-01-14: qty 100

## 2022-01-14 MED ORDER — LIDOCAINE HCL (PF) 1 % IJ SOLN
INTRAMUSCULAR | Status: AC
  Filled 2022-01-14: qty 60

## 2022-01-14 MED ORDER — FENTANYL CITRATE (PF) 100 MCG/2ML IJ SOLN
INTRAMUSCULAR | Status: DC | PRN
  Administered 2022-01-14: 25 ug via INTRAVENOUS
  Administered 2022-01-14: 12.5 ug via INTRAVENOUS

## 2022-01-14 MED ORDER — MIDAZOLAM HCL 5 MG/5ML IJ SOLN
INTRAMUSCULAR | Status: AC
  Filled 2022-01-14: qty 5

## 2022-01-14 MED ORDER — LIDOCAINE HCL (PF) 1 % IJ SOLN
INTRAMUSCULAR | Status: DC | PRN
  Administered 2022-01-14: 55 mL

## 2022-01-14 MED ORDER — HEPARIN (PORCINE) IN NACL 1000-0.9 UT/500ML-% IV SOLN
INTRAVENOUS | Status: DC | PRN
  Administered 2022-01-14: 500 mL

## 2022-01-14 SURGICAL SUPPLY — 12 items
CABLE SURGICAL S-101-97-12 (CABLE) ×2 IMPLANT
CATH RIGHTSITE C315HIS02 (CATHETERS) ×1 IMPLANT
IPG PACE AZUR XT DR MRI W1DR01 (Pacemaker) IMPLANT
LEAD CAPSURE NOVUS 5076-52CM (Lead) ×1 IMPLANT
LEAD SELECT SECURE 3830 383069 (Lead) IMPLANT
PACE AZURE XT DR MRI W1DR01 (Pacemaker) ×2 IMPLANT
PAD DEFIB RADIO PHYSIO CONN (PAD) ×2 IMPLANT
SELECT SECURE 3830 383069 (Lead) ×2 IMPLANT
SHEATH 7FR PRELUDE SNAP 13 (SHEATH) ×1 IMPLANT
SHEATH 9FR PRELUDE SNAP 13 (SHEATH) ×1 IMPLANT
TRAY PACEMAKER INSERTION (PACKS) ×2 IMPLANT
WIRE HI TORQ VERSACORE-J 145CM (WIRE) ×1 IMPLANT

## 2022-01-14 NOTE — Discharge Instructions (Addendum)
After Your ICD (Implantable Cardiac Defibrillator)   You have a [employer] ICD  ACTIVITY Do not lift your arm above shoulder height for 1 week after your procedure. After 7 days, you may progress as below.  You should remove your sling 24 hours after your procedure, unless otherwise instructed by your provider.     Wednesday January 21, 2022  Thursday January 22, 2022 Friday January 23, 2022 Saturday January 24, 2022   Do not lift, push, pull, or carry anything over 10 pounds with the affected arm until 6 weeks (Wednesday February 25, 2022 ) after your procedure.   You may drive AFTER your wound check, unless you have been told otherwise by your provider.   Ask your healthcare provider when you can go back to work   INCISION/Dressing If you are on a blood thinner such as Coumadin, Xarelto, Eliquis, Plavix, or Pradaxa please confirm with your provider when this should be resumed 6/17  If large square, outer bandage is left in place, this can be removed after 24 hours from your procedure. Do not remove steri-strips or glue as below.   Monitor your defibrillator site for redness, swelling, and drainage. Call the device clinic at [phone] if you experience these symptoms or fever/chills.  If your incision is sealed with Steri-strips or staples, you may shower 7 days after your procedure or when told by your provider. Do not remove the steri-strips or let the shower hit directly on your site. You may wash around your site with soap and water.    If you were discharged in a sling, please do not wear this during the day more than 48 hours after your surgery unless otherwise instructed. This may increase the risk of stiffness and soreness in your shoulder.   Avoid lotions, ointments, or perfumes over your incision until it is well-healed.  You may use a hot tub or a pool AFTER your wound check appointment if the incision is completely closed.  Your ICD is designed to protect you from life threatening  heart rhythms. Because of this, you may receive a shock.   1 shock with no symptoms:  Call the office during business hours. 1 shock with symptoms (chest pain, chest pressure, dizziness, lightheadedness, shortness of breath, overall feeling unwell):  Call 911. If you experience 2 or more shocks in 24 hours:  Call 911. If you receive a shock, you should not drive for 6 months per the [place] DMV IF you receive appropriate therapy from your ICD.   ICD Alerts:  Some alerts are vibratory and others beep. These are NOT emergencies. Please call our office to let us know. If this occurs at night or on weekends, it can wait until the next business day. Send a remote transmission.  If your device is capable of reading fluid status (for heart failure), you will be offered monthly monitoring to review this with you.   DEVICE MANAGEMENT Remote monitoring is used to monitor your ICD from home. This monitoring is scheduled every 91 days by our office. It allows [place] to keep an eye on the functioning of your device to ensure it is working properly. You will routinely see your Electrophysiologist annually (more often if necessary).   You should receive your ID card for your new device in 4-8 weeks. Keep this card with you at all times once received. Consider wearing a medical alert bracelet or necklace.  Your ICD  may be MRI compatible. This will be discussed at your next  office visit/wound check.  You should avoid contact with strong electric or magnetic fields.   Do not use amateur (ham) radio equipment or electric (arc) welding torches. MP3 player headphones with magnets should not be used. Some devices are safe to use if held at least 12 inches (30 cm) from your defibrillator. These include power tools, lawn mowers, and speakers. If you are unsure if something is safe to use, ask your health care provider.  When using your cell phone, hold it to the ear that is on the opposite side from the defibrillator. Do not  leave your cell phone in a pocket over the defibrillator.  You may safely use electric blankets, heating pads, computers, and microwave ovens.  Call the office right away if: You have chest pain. You feel more than one shock. You feel more short of breath than you have felt before. You feel more light-headed than you have felt before. Your incision starts to open up.  This information is not intended to replace advice given to you by your health care provider. Make sure you discuss any questions you have with your health care provider.

## 2022-01-14 NOTE — Progress Notes (Signed)
Dr Caryl Comes notified of post CXR-orders to d/c pt

## 2022-01-14 NOTE — H&P (Signed)
Patient Care Team: Elsie Stain, MD as PCP - General (Pulmonary Disease) Serita Kyle, MD as PCP - Cardiology (Cardiology)   HPI  Edward Butler is a 81 y.o. 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   DATE TEST EF    8/22 Echo   55-60 %  AS moderate-severe  3/23 Echo    65-70 % Ao Mean Grad 36 LVH-asymmetric 15/8 mm             Date Cr K Hgb  3/23 1.01 3.8 13.7   6/23 0.95 4.6 67.3    Thromboembolic risk factors ( age -21, HTN-1, DM-1) for a CHADSVASc Score of >=4 (has a history of a intracranial aneurysm about 40 years ago)  Records and Results Reviewed   Past Medical History:  Diagnosis Date   Aneurysm ([place])    Atrial fibrillation ([place])    CAP (community acquired pneumonia) 10/20/2021   Diabetes mellitus without complication ([place])    Hypertension    Non-recurrent acute suppurative otitis media of right ear without spontaneous rupture of tympanic membrane 07/04/2021   Stroke [facility])     Past Surgical History:  Procedure Laterality Date   skin cancer surgeries       Current Facility-Administered Medications  Medication Dose Route Frequency Provider Last Rate Last Admin   0.9 %  sodium chloride infusion   Intravenous Continuous Deboraha Sprang, MD 50 mL/hr at 01/14/22 0645 New Bag at 01/14/22 0645   0.9 %  sodium chloride infusion   Intravenous Continuous Deboraha Sprang, MD 50 mL/hr at 01/14/22 0644 New Bag at 01/14/22 0644   ceFAZolin (ANCEF) IVPB 2g/100 mL premix  2 g Intravenous On Call Deboraha Sprang, MD       gentamicin (GARAMYCIN) 80 mg in sodium chloride 0.9 % 500 mL irrigation  80 mg Irrigation On Call Deboraha Sprang, MD        No Known Allergies    Social History   Tobacco Use   Smoking status: Never   Smokeless tobacco: Never  Vaping Use   Vaping Use: Never used  Substance Use Topics   Alcohol use: Not Currently   Drug use: Not Currently     No family history on file.   Current  Meds  Medication Sig   Accu-Chek FastClix Lancets MISC Use to check blood sugar once daily. E11.9   acetaminophen (TYLENOL) 500 MG tablet Take 1,000 mg by mouth daily as needed for moderate pain.   apixaban (ELIQUIS) 5 MG TABS tablet Take 5 mg by mouth 2 (two) times daily.   Blood Glucose Monitoring Suppl (ACCU-CHEK GUIDE ME) w/Device KIT USE AS DIRECTED   Calcium Carbonate-Vitamin D (CALCIUM 600+D PO) Take 2 tablets by mouth daily.   cholecalciferol (VITAMIN D3) 25 MCG (1000 UNIT) tablet Take 1,000 Units by mouth daily.   enalapril (VASOTEC) 5 MG tablet Take 5 mg by mouth daily.   finasteride (PROSCAR) 5 MG tablet TAKE 1 TABLET EVERY DAY   glucose blood (ACCU-CHEK GUIDE) test strip USE TO CHECK BLOOD SUGAR ONCE DAILY.   levothyroxine (SYNTHROID) 25 MCG tablet Take 25 mcg by mouth daily before breakfast.   magnesium oxide (MAG-OX) 400 MG tablet Take 400 mg by mouth daily.   metFORMIN (GLUCOPHAGE-XR) 500 MG 24 hr tablet Take 2 tablets (1,000 mg total) by mouth 2 (two) times daily.   simvastatin (ZOCOR) 10 MG tablet Take 10 mg by  mouth daily.   verapamil (CALAN-SR) 240 MG CR tablet Take 1 tablet (240 mg total) by mouth daily.     Review of Systems negative except from HPI and PMH  Physical Exam BP 131/66   Pulse 64   Temp 98.5 F (36.9 C) (Oral)   Resp 18   Ht 5' 6.5" (1.689 m)   Wt 72.6 kg   SpO2 98%   BMI 25.44 kg/m  Well developed and well nourished in no acute distress HENT normal E scleral and icterus clear Neck Supple JVP flat; carotids brisk and full Clear to ausculation IRRegular rate and CXFQHK,2/5 systolic with persisting S2 ( surprisingly) Soft with active bowel sounds No clubbing cyanosis  Edema Alert and oriented, grossly normal motor and sensory function Skin Warm and Dry    Assessment and  Plan Atrial fibrillation-paroxysmal with a rapid ventricular rate   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  The patient understands agrees and is willing to proceed.

## 2022-01-15 ENCOUNTER — Telehealth: Payer: Self-pay

## 2022-01-15 ENCOUNTER — Encounter (HOSPITAL_COMMUNITY): Payer: Self-pay | Admitting: Internal Medicine

## 2022-01-15 NOTE — Telephone Encounter (Signed)
Follow-up after same day discharge: Implant date: 01/14/22 MD: Virl Axe, MD Device: Dual Chamber Medtronic PPM Location: Left Chest   Wound check visit: 01/28/22 at 11:20 90 day MD follow-up: 04/28/22 at 3:00  Remote Transmission received: No  Dressing/sling removed: yes  Confirm OAC restart on: Resume 01/17/22 pm  Patient states he is doing well. Denies pain at implant site. Appointments above confirmed.  Patient is provided device clinic contact for additional questions or concerns he may have. Remote transmission has not been received as patient has not been accepted into care link. Transferred call to remote specialist who is providing app assistance and placing patient manually into carelink.

## 2022-01-15 NOTE — Telephone Encounter (Signed)
-----   Message from Shirley Friar, PA-C sent at 01/14/2022  4:06 PM EDT ----- Same day PPM, SK 6/14

## 2022-01-16 ENCOUNTER — Ambulatory Visit (HOSPITAL_COMMUNITY)
Admission: EM | Admit: 2022-01-16 | Discharge: 2022-01-17 | Disposition: A | Discharge status: Home / Self Care | Payer: Medicare HMO | Attending: Internal Medicine | Admitting: Internal Medicine

## 2022-01-16 ENCOUNTER — Emergency Department (HOSPITAL_COMMUNITY): Payer: Medicare HMO

## 2022-01-16 ENCOUNTER — Other Ambulatory Visit: Payer: Self-pay

## 2022-01-16 ENCOUNTER — Encounter (HOSPITAL_COMMUNITY): Payer: Self-pay | Admitting: Emergency Medicine

## 2022-01-16 ENCOUNTER — Ambulatory Visit (HOSPITAL_COMMUNITY)
Admission: EM | Disposition: A | Discharge status: Home / Self Care | Payer: Self-pay | Source: Home / Self Care | Attending: Emergency Medicine

## 2022-01-16 DIAGNOSIS — Z8673 Personal history of transient ischemic attack (TIA), and cerebral infarction without residual deficits: Secondary | ICD-10-CM | POA: Insufficient documentation

## 2022-01-16 DIAGNOSIS — T82120A Displacement of cardiac electrode, initial encounter: Secondary | ICD-10-CM | POA: Diagnosis not present

## 2022-01-16 DIAGNOSIS — T829XXA Unspecified complication of cardiac and vascular prosthetic device, implant and graft, initial encounter: Secondary | ICD-10-CM | POA: Diagnosis present

## 2022-01-16 DIAGNOSIS — I10 Essential (primary) hypertension: Secondary | ICD-10-CM

## 2022-01-16 DIAGNOSIS — E119 Type 2 diabetes mellitus without complications: Secondary | ICD-10-CM | POA: Insufficient documentation

## 2022-01-16 DIAGNOSIS — J9809 Other diseases of bronchus, not elsewhere classified: Secondary | ICD-10-CM | POA: Diagnosis not present

## 2022-01-16 DIAGNOSIS — I2510 Atherosclerotic heart disease of native coronary artery without angina pectoris: Secondary | ICD-10-CM | POA: Insufficient documentation

## 2022-01-16 DIAGNOSIS — I4891 Unspecified atrial fibrillation: Secondary | ICD-10-CM

## 2022-01-16 DIAGNOSIS — I959 Hypotension, unspecified: Secondary | ICD-10-CM | POA: Diagnosis not present

## 2022-01-16 DIAGNOSIS — R911 Solitary pulmonary nodule: Secondary | ICD-10-CM | POA: Diagnosis not present

## 2022-01-16 DIAGNOSIS — Y848 Other medical procedures as the cause of abnormal reaction of the patient, or of later complication, without mention of misadventure at the time of the procedure: Secondary | ICD-10-CM | POA: Insufficient documentation

## 2022-01-16 DIAGNOSIS — Z950 Presence of cardiac pacemaker: Secondary | ICD-10-CM | POA: Insufficient documentation

## 2022-01-16 DIAGNOSIS — Z7901 Long term (current) use of anticoagulants: Secondary | ICD-10-CM | POA: Diagnosis not present

## 2022-01-16 DIAGNOSIS — R000 Tachycardia, unspecified: Secondary | ICD-10-CM | POA: Diagnosis not present

## 2022-01-16 DIAGNOSIS — R0789 Other chest pain: Secondary | ICD-10-CM | POA: Diagnosis not present

## 2022-01-16 DIAGNOSIS — I4510 Unspecified right bundle-branch block: Secondary | ICD-10-CM | POA: Insufficient documentation

## 2022-01-16 DIAGNOSIS — J9811 Atelectasis: Secondary | ICD-10-CM | POA: Diagnosis not present

## 2022-01-16 DIAGNOSIS — I700 Atherosclerosis of aorta: Secondary | ICD-10-CM | POA: Diagnosis not present

## 2022-01-16 DIAGNOSIS — T82190A Other mechanical complication of cardiac electrode, initial encounter: Secondary | ICD-10-CM | POA: Insufficient documentation

## 2022-01-16 DIAGNOSIS — I495 Sick sinus syndrome: Secondary | ICD-10-CM | POA: Diagnosis not present

## 2022-01-16 DIAGNOSIS — R079 Chest pain, unspecified: Secondary | ICD-10-CM | POA: Diagnosis not present

## 2022-01-16 DIAGNOSIS — I480 Paroxysmal atrial fibrillation: Secondary | ICD-10-CM | POA: Diagnosis not present

## 2022-01-16 DIAGNOSIS — I517 Cardiomegaly: Secondary | ICD-10-CM | POA: Diagnosis not present

## 2022-01-16 HISTORY — PX: LEAD REVISION/REPAIR: EP1213

## 2022-01-16 LAB — CBC WITH DIFFERENTIAL/PLATELET
Abs Immature Granulocytes: 0.02 10*3/uL (ref 0.00–0.07)
Basophils Absolute: 0 10*3/uL (ref 0.0–0.1)
Basophils Relative: 0 %
Eosinophils Absolute: 0.3 10*3/uL (ref 0.0–0.5)
Eosinophils Relative: 3 %
HCT: 44.7 % (ref 39.0–52.0)
Hemoglobin: 14.8 g/dL (ref 13.0–17.0)
Immature Granulocytes: 0 %
Lymphocytes Relative: 21 %
Lymphs Abs: 1.8 10*3/uL (ref 0.7–4.0)
MCH: 30.6 pg (ref 26.0–34.0)
MCHC: 33.1 g/dL (ref 30.0–36.0)
MCV: 92.5 fL (ref 80.0–100.0)
Monocytes Absolute: 0.7 10*3/uL (ref 0.1–1.0)
Monocytes Relative: 8 %
Neutro Abs: 5.9 10*3/uL (ref 1.7–7.7)
Neutrophils Relative %: 68 %
Platelets: 154 10*3/uL (ref 150–400)
RBC: 4.83 MIL/uL (ref 4.22–5.81)
RDW: 14.5 % (ref 11.5–15.5)
WBC: 8.7 10*3/uL (ref 4.0–10.5)
nRBC: 0 % (ref 0.0–0.2)

## 2022-01-16 LAB — URINALYSIS, ROUTINE W REFLEX MICROSCOPIC
Bilirubin Urine: NEGATIVE
Glucose, UA: NEGATIVE mg/dL
Hgb urine dipstick: NEGATIVE
Ketones, ur: NEGATIVE mg/dL
Leukocytes,Ua: NEGATIVE
Nitrite: NEGATIVE
Protein, ur: NEGATIVE mg/dL
Specific Gravity, Urine: 1.019 (ref 1.005–1.030)
pH: 5 (ref 5.0–8.0)

## 2022-01-16 LAB — SURGICAL PCR SCREEN
MRSA, PCR: NEGATIVE
Staphylococcus aureus: NEGATIVE

## 2022-01-16 LAB — COMPREHENSIVE METABOLIC PANEL
ALT: 11 U/L (ref 0–44)
AST: 14 U/L — ABNORMAL LOW (ref 15–41)
Albumin: 3.6 g/dL (ref 3.5–5.0)
Alkaline Phosphatase: 40 U/L (ref 38–126)
Anion gap: 11 (ref 5–15)
BUN: 14 mg/dL (ref 8–23)
CO2: 19 mmol/L — ABNORMAL LOW (ref 22–32)
Calcium: 9 mg/dL (ref 8.9–10.3)
Chloride: 109 mmol/L (ref 98–111)
Creatinine, Ser: 0.92 mg/dL (ref 0.61–1.24)
GFR, Estimated: >60 mL/min (ref >60)
Glucose, Bld: 127 mg/dL — ABNORMAL HIGH (ref 70–99)
Potassium: 4.4 mmol/L (ref 3.5–5.1)
Sodium: 139 mmol/L (ref 135–145)
Total Bilirubin: 1.1 mg/dL (ref 0.3–1.2)
Total Protein: 5.9 g/dL — ABNORMAL LOW (ref 6.5–8.1)

## 2022-01-16 LAB — TROPONIN I (HIGH SENSITIVITY)
Troponin I (High Sensitivity): 23 ng/L — ABNORMAL HIGH (ref <18)
Troponin I (High Sensitivity): 19 ng/L — ABNORMAL HIGH (ref <18)

## 2022-01-16 LAB — LACTIC ACID, PLASMA: Lactic Acid, Venous: 1.7 mmol/L (ref 0.5–1.9)

## 2022-01-16 LAB — GLUCOSE, CAPILLARY
Glucose-Capillary: 150 mg/dL — ABNORMAL HIGH (ref 70–99)
Glucose-Capillary: 164 mg/dL — ABNORMAL HIGH (ref 70–99)

## 2022-01-16 SURGERY — LEAD REVISION/REPAIR

## 2022-01-16 MED ORDER — METFORMIN HCL ER 500 MG PO TB24: 1000.0000 mg | ORAL_TABLET | Freq: Two times a day (BID) | ORAL | Status: DC

## 2022-01-16 MED ORDER — CHLORHEXIDINE GLUCONATE 4 % EX LIQD: 60.0000 mL | Freq: Once | CUTANEOUS | Status: DC

## 2022-01-16 MED ORDER — HEPARIN (PORCINE) IN NACL 1000-0.9 UT/500ML-% IV SOLN
INTRAVENOUS | Status: AC
  Filled 2022-01-16: qty 500

## 2022-01-16 MED ORDER — LIDOCAINE HCL 1 % IJ SOLN
INTRAMUSCULAR | Status: AC
  Filled 2022-01-16: qty 20

## 2022-01-16 MED ORDER — ONDANSETRON HCL 4 MG/2ML IJ SOLN: 4.0000 mg | Freq: Four times a day (QID) | INTRAMUSCULAR | Status: DC | PRN

## 2022-01-16 MED ORDER — CEFAZOLIN SODIUM-DEXTROSE 1-4 GM/50ML-% IV SOLN
1.0000 g | Freq: Four times a day (QID) | INTRAVENOUS | Status: AC
  Administered 2022-01-16 – 2022-01-17 (×3): 1 g via INTRAVENOUS
  Filled 2022-01-16 (×3): qty 50

## 2022-01-16 MED ORDER — FENTANYL CITRATE (PF) 100 MCG/2ML IJ SOLN
INTRAMUSCULAR | Status: DC | PRN
  Administered 2022-01-16: 12.5 ug via INTRAVENOUS
  Administered 2022-01-16: 25 ug via INTRAVENOUS

## 2022-01-16 MED ORDER — MIDAZOLAM HCL 5 MG/5ML IJ SOLN
INTRAMUSCULAR | Status: DC | PRN
  Administered 2022-01-16: 2 mg via INTRAVENOUS
  Administered 2022-01-16: 1 mg via INTRAVENOUS

## 2022-01-16 MED ORDER — SODIUM CHLORIDE 0.9 % IV SOLN
INTRAVENOUS | Status: AC
  Filled 2022-01-16: qty 2

## 2022-01-16 MED ORDER — SIMVASTATIN 20 MG PO TABS
10.0000 mg | ORAL_TABLET | Freq: Every day | ORAL | Status: DC
  Administered 2022-01-17: 10 mg via ORAL
  Filled 2022-01-16: qty 1

## 2022-01-16 MED ORDER — FINASTERIDE 5 MG PO TABS
5.0000 mg | ORAL_TABLET | Freq: Every day | ORAL | Status: DC
  Administered 2022-01-17: 5 mg via ORAL
  Filled 2022-01-16: qty 1

## 2022-01-16 MED ORDER — LIDOCAINE HCL (PF) 1 % IJ SOLN
INTRAMUSCULAR | Status: DC | PRN
  Administered 2022-01-16: 60 mL

## 2022-01-16 MED ORDER — ACETAMINOPHEN 325 MG PO TABS
325.0000 mg | ORAL_TABLET | ORAL | Status: DC | PRN
  Administered 2022-01-17: 650 mg via ORAL
  Filled 2022-01-16: qty 2

## 2022-01-16 MED ORDER — LACTATED RINGERS IV BOLUS
1000.0000 mL | Freq: Once | INTRAVENOUS | Status: AC
Start: 2022-01-16 — End: 2022-01-16
  Administered 2022-01-16: 1000 mL via INTRAVENOUS

## 2022-01-16 MED ORDER — HEPARIN (PORCINE) IN NACL 1000-0.9 UT/500ML-% IV SOLN
INTRAVENOUS | Status: DC | PRN
  Administered 2022-01-16: 500 mL

## 2022-01-16 MED ORDER — ACETAMINOPHEN 500 MG PO TABS: 1000.0000 mg | ORAL_TABLET | Freq: Every day | ORAL | Status: DC | PRN

## 2022-01-16 MED ORDER — FENTANYL CITRATE (PF) 100 MCG/2ML IJ SOLN
INTRAMUSCULAR | Status: AC
  Filled 2022-01-16: qty 2

## 2022-01-16 MED ORDER — ENALAPRIL MALEATE 2.5 MG PO TABS
5.0000 mg | ORAL_TABLET | Freq: Every day | ORAL | Status: DC
  Filled 2022-01-16: qty 1
  Filled 2022-01-16: qty 2

## 2022-01-16 MED ORDER — VERAPAMIL HCL ER 240 MG PO TBCR
240.0000 mg | EXTENDED_RELEASE_TABLET | Freq: Every day | ORAL | Status: DC
Start: 2022-01-16 — End: 2022-01-17
  Administered 2022-01-16 – 2022-01-17 (×2): 240 mg via ORAL
  Filled 2022-01-16 (×3): qty 1

## 2022-01-16 MED ORDER — SODIUM CHLORIDE 0.9 % IV SOLN
80.0000 mg | INTRAVENOUS | Status: AC
  Administered 2022-01-16: 160 mg

## 2022-01-16 MED ORDER — CEFAZOLIN SODIUM-DEXTROSE 2-4 GM/100ML-% IV SOLN
INTRAVENOUS | Status: AC
  Filled 2022-01-16: qty 100

## 2022-01-16 MED ORDER — DILTIAZEM HCL 25 MG/5ML IV SOLN: 20.0000 mg | Freq: Once | INTRAVENOUS | Status: DC

## 2022-01-16 MED ORDER — MAGNESIUM OXIDE -MG SUPPLEMENT 400 (240 MG) MG PO TABS
400.0000 mg | ORAL_TABLET | Freq: Every day | ORAL | Status: DC
  Administered 2022-01-17: 400 mg via ORAL
  Filled 2022-01-16: qty 1

## 2022-01-16 MED ORDER — IOHEXOL 350 MG/ML SOLN
50.0000 mL | Freq: Once | INTRAVENOUS | Status: AC | PRN
  Administered 2022-01-16: 50 mL via INTRAVENOUS

## 2022-01-16 MED ORDER — DILTIAZEM LOAD VIA INFUSION: 20.0000 mg | Freq: Once | INTRAVENOUS | Status: DC

## 2022-01-16 MED ORDER — LEVOTHYROXINE SODIUM 25 MCG PO TABS
25.0000 ug | ORAL_TABLET | Freq: Every day | ORAL | Status: DC
  Administered 2022-01-17: 25 ug via ORAL
  Filled 2022-01-16: qty 1

## 2022-01-16 MED ORDER — SODIUM CHLORIDE 0.9 % IV SOLN: INTRAVENOUS | Status: DC

## 2022-01-16 MED ORDER — VITAMIN D 25 MCG (1000 UNIT) PO TABS
1000.0000 [IU] | ORAL_TABLET | Freq: Every day | ORAL | Status: DC
  Administered 2022-01-17: 1000 [IU] via ORAL
  Filled 2022-01-16: qty 1

## 2022-01-16 MED ORDER — MIDAZOLAM HCL 5 MG/5ML IJ SOLN
INTRAMUSCULAR | Status: AC
  Filled 2022-01-16: qty 5

## 2022-01-16 MED ORDER — CEFAZOLIN SODIUM-DEXTROSE 2-4 GM/100ML-% IV SOLN
2.0000 g | INTRAVENOUS | Status: AC
  Administered 2022-01-16: 2 g via INTRAVENOUS

## 2022-01-16 SURGICAL SUPPLY — 7 items
CABLE SURGICAL S-101-97-12 (CABLE) ×2 IMPLANT
LEAD CAPSURE NOVUS 5076-58CM (Lead) ×1 IMPLANT
PAD DEFIB RADIO PHYSIO CONN (PAD) ×2 IMPLANT
POUCH AIGIS-R ANTIBACT PPM (Mesh General) ×2 IMPLANT
POUCH AIGIS-R ANTIBACT PPM MED (Mesh General) IMPLANT
SHEATH 7FR PRELUDE SNAP 13 (SHEATH) ×1 IMPLANT
TRAY PACEMAKER INSERTION (PACKS) ×2 IMPLANT

## 2022-01-16 NOTE — ED Notes (Signed)
Patient transported to CT 

## 2022-01-16 NOTE — ED Triage Notes (Signed)
Pt BIB [facility] from home, pt had a pacemaker placed on Wednesday of this week, has been having pain around the site. Pt noticed non-radiating central chest pain that started last night. Denies shortness of breath. Given '324mg'$  asa and 2NTG by EMS.

## 2022-01-16 NOTE — Plan of Care (Signed)
  Problem: Education: Goal: Knowledge of General Education information will improve Description: Including pain rating scale, medication(s)/side effects and non-pharmacologic comfort measures Outcome: Progressing   Problem: Health Behavior/Discharge Planning: Goal: Ability to manage health-related needs will improve Outcome: Progressing   Problem: Clinical Measurements: Goal: Cardiovascular complication will be avoided Outcome: Progressing   Problem: Clinical Measurements: Goal: Will remain free from infection Outcome: Progressing   Problem: Pain Managment: Goal: General experience of comfort will improve Outcome: Progressing   Problem: Skin Integrity: Goal: Risk for impaired skin integrity will decrease Outcome: Progressing   Problem: Health Behavior/Discharge Planning: Goal: Ability to identify and utilize available resources and services will improve Outcome: Progressing Goal: Ability to manage health-related needs will improve Outcome: Progressing

## 2022-01-16 NOTE — Consult Note (Addendum)
ELECTROPHYSIOLOGY HP NOTE    Patient ID: Edward Butler MRN: [id number], DOB/AGE: 1940-08-08 81 y.o.  Admit date: 01/16/2022 Date of Consult: 01/16/2022  Primary Physician: Elsie Stain, MD Primary Cardiologist: Dina Rich, MD  Electrophysiologist: Dr. Caryl Comes  Referring Provider: Dr. Kathrynn Humble Reason for admission: Chest pain post PPM placement 01/14/2022 , small pericardial effusion  Patient Profile: Edward Butler is a 81 y.o. 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 Kathrynn Humble.  HPI:  Edward Butler is a 81 y.o. male with history as above.   Pt underwent planned outpatient PPM implantation 01/14/2022 by Dr. Caryl Comes.   Called 6/15 by device clinic and was doing well without complaints or pain, planned to start [place] back 6/17 as previously ordered.   He started to have discomfort in his chest at ~ 9 pm last night, that has been intermittent since. It is consistently brought on by taking a deep breath.  He feels a "catch" and it is hard to take a full deep breath. No associated N/V, diaphoresis, palpitations, lightheadedness or syncope.   CXR with low lung volumes, and subsegmental atelectasis; mild cardiomegaly, Aortic atherosclerosis  CT angio with no PE, small pericardial effusion  K 4.4, Cr 0.92, WBG 8.7, Hgb 14.8, HS trop 23, Lactic Acid 1.7. Temp 99.2 on arrival.   EKG on arrival shows AF with RVR. Has not restarted [place], planned to tomorrow night as ordered.   PPM site looks stable. Steri-strips remove by ED provider. Pt has 4/10 chest pain with inspiration regardless of position. Denies exertion chest pain, syncope, or SOB.   Past Medical History:  Diagnosis Date   Aneurysm ([place])    Atrial fibrillation ([place])    CAP (community acquired pneumonia) 10/20/2021   Diabetes mellitus without complication ([place])    Hypertension    Non-recurrent acute  suppurative otitis media of right ear without spontaneous rupture of tympanic membrane 07/04/2021   Stroke [facility])      Surgical History:  Past Surgical History:  Procedure Laterality Date   PACEMAKER IMPLANT N/A 01/14/2022   Procedure: PACEMAKER IMPLANT;  Surgeon: Deboraha Sprang, MD;  Location: [place] [facility];  Service: Cardiovascular;  Laterality: N/A;   skin cancer surgeries        (Not in a hospital admission)   Inpatient Medications:   diltiazem  20 mg Intravenous Once   verapamil  240 mg Oral Daily    Allergies: No Known Allergies  Social History   Socioeconomic History   Marital status: Married    Spouse name: Not on file   Number of children: Not on file   Years of education: Not on file   Highest education level: Not on file  Occupational History   Not on file  Tobacco Use   Smoking status: Never   Smokeless tobacco: Never  Vaping Use   Vaping Use: Never used  Substance and Sexual Activity   Alcohol use: Not Currently   Drug use: Not Currently   Sexual activity: Not Currently  Other Topics Concern   Not on file  Social History Narrative   Not on file   Social Determinants of Health   Financial Resource Strain: Not on file  Food Insecurity: Not on file  Transportation Needs: Not on file  Physical Activity: Not on file  Stress: Not on file  Social Connections: Not on file  Intimate Partner Violence: Not on file     No family history on file.   Review of Systems: All other systems reviewed and are otherwise negative except as noted above.  Physical Exam: Vitals:   01/16/22 0800 01/16/22 0845 01/16/22 0900 01/16/22 0915  BP: 109/82 98/72 115/71 101/85  Pulse:  (!) 137 (!) 132 67  Resp: (!) 25 (!) 23 (!) 23 (!) 28  Temp:      TempSrc:      SpO2: 98% 99% 97% 97%    GEN- The patient is well appearing, alert and oriented x 3 today.   HEENT: normocephalic, atraumatic; sclera clear, conjunctiva pink; hearing intact; oropharynx clear; neck  supple Lungs- Clear to ausculation bilaterally, normal work of breathing.  No wheezes, rales, rhonchi Heart- Regular rate and rhythm, no murmurs, rubs or gallops GI- soft, non-tender, non-distended, bowel sounds present Extremities- no clubbing, cyanosis, or edema; DP/PT/radial pulses 2+ bilaterally MS- no significant deformity or atrophy Skin- warm and dry, no rash or lesion Psych- euthymic mood, full affect Neuro- strength and sensation are intact  Labs:   Lab Results  Component Value Date   WBC 8.7 01/16/2022   HGB 14.8 01/16/2022   HCT 44.7 01/16/2022   MCV 92.5 01/16/2022   PLT 154 01/16/2022    Recent Labs  Lab 01/16/22 0659  NA 139  K 4.4  CL 109  CO2 19*  BUN 14  CREATININE 0.92  CALCIUM 9.0  PROT 5.9*  BILITOT 1.1  ALKPHOS 40  ALT 11  AST 14*  GLUCOSE 127*      Radiology/Studies: CT Angio Chest PE W and/or Wo Contrast  Result Date: 01/16/2022 CLINICAL DATA:  Pulmonary embolism (PE) suspected, high prob. EXAM: CT ANGIOGRAPHY CHEST WITH CONTRAST TECHNIQUE: Multidetector CT imaging of the chest was performed using the standard protocol during bolus administration of intravenous contrast. Multiplanar CT image reconstructions and MIPs were obtained to evaluate the vascular anatomy. RADIATION DOSE REDUCTION: This exam was performed according to the departmental dose-optimization program which includes automated exposure control, adjustment of the mA and/or kV according to patient size and/or use of iterative reconstruction technique. CONTRAST:  29m OMNIPAQUE IOHEXOL 350 MG/ML SOLN COMPARISON:  Chest radiograph 01/16/2022 FINDINGS: Cardiovascular: Pulmonary arterial opacification is adequate without evidence of emboli. There is thoracic aortic atherosclerosis without aneurysm. The heart is enlarged with extensive coronary atherosclerosis, and there is a small pericardial effusion. A recently placed left subclavian pacemaker is noted with gas and swelling in the regional  soft tissues of the left chest wall and with leads terminating in the right atrium and right ventricle. Mediastinum/Nodes: No enlarged axillary, mediastinal, or hilar lymph nodes. Small sliding hiatal hernia. Unremarkable thyroid. Lungs/Pleura: No pleural effusion or pneumothorax. Mild atelectasis in the lung bases. 4 mm triangular subpleural nodule in the right middle lobe abutting the minor fissure most compatible with a benign intrapulmonary lymph node and for which no follow-up imaging is recommended. Mild bronchial wall thickening. Upper Abdomen: Status post cholecystectomy. 9 mm centrally low-density, peripherally calcified lesion anteriorly in the left hepatic lobe, too small to fully characterize. Musculoskeletal: No acute osseous abnormality or suspicious osseous lesion. Review of the MIP images confirms the above findings. IMPRESSION: 1. No evidence of pulmonary emboli. 2. Mild bronchial wall thickening and mild atelectasis in the lung bases. 3. Cardiomegaly with extensive coronary atherosclerosis and small pericardial effusion. 4. Small sliding hiatal hernia. 5. Aortic Atherosclerosis (ICD10-I70.0). Electronically Signed   By: ALogan BoresM.D.   On: 01/16/2022  08:18   DG Chest Portable 1 View  Result Date: 01/16/2022 CLINICAL DATA:  81 year old male with history of chest pain. EXAM: PORTABLE CHEST 1 VIEW COMPARISON:  Chest x-ray 01/14/2022. FINDINGS: Lung volumes are slightly low. Bibasilar opacities favored to reflect subsegmental atelectasis and/or chronic scarring. No confluent consolidative airspace disease. No pleural effusions. No pneumothorax. No evidence of pulmonary edema. Heart size is mildly enlarged. The patient is rotated to the right on today's exam, resulting in distortion of the mediastinal contours and reduced diagnostic sensitivity and specificity for mediastinal pathology. Atherosclerotic calcifications are noted in the thoracic aorta. Left-sided pacemaker device in place with lead  tips projecting over the expected location of the right atrium and right ventricle. IMPRESSION: 1. Low lung volumes with bibasilar areas of subsegmental atelectasis and/or chronic scarring. 2. Mild cardiomegaly. 3. Aortic atherosclerosis. Electronically Signed   By: Vinnie Langton M.D.   On: 01/16/2022 07:48   DG Chest 2 View  Result Date: 01/14/2022 CLINICAL DATA:  Cardiac device in-situ. EXAM: CHEST - 2 VIEW COMPARISON:  Chest two views 10/29/2021 FINDINGS: Cardiac silhouette is again mildly enlarged. Mild-to-moderate calcification within aortic arch. New left chest wall cardiac pacer with leads overlying the right atrium and right ventricle. Unchanged mild bibasilar linear likely scarring. No pleural effusion or pneumothorax. Mild multilevel degenerative disc changes of the thoracic spine. IMPRESSION: 1. New left chest wall cardiac pacer.  No pneumothorax. 2. No acute lung process. Electronically Signed   By: Yvonne Kendall M.D.   On: 01/14/2022 14:18    EKG:on arrival shows AF (personally reviewed)  TELEMETRY: AF with RVR 100-130s (personally reviewed)  DEVICE HISTORY: DDD MDT PPM implanted 01/14/2022 by Dr. Caryl Comes  Assessment/Plan: 1.  Pleuritic chest pain 2. Suspect micro lead perforation 3. Tachy-brady syndrome s/p  MDT DDD PPM 01/14/2022 Pt presents with pleuritic chest pain starting last night.  Unfortunately, both leads appear relatively stable and it is difficult to tell which is the likely cause; Thus, pt will undergo revision of both leads.  Explained risks, benefits, and alternatives to lead revision implantation, including but not limited to bleeding, infection, pneumothorax, pericardial effusion, lead dislodgement, heart attack, stroke, or death.  Pt verbalized understanding and agrees to proceed   4. Paroxysmal atrial fibrillation Back in AF with RVR today.  [place] on hold for procedures.   Dr. Lovena Le has seen.   For questions or updates, please contact [place] Please  consult [email or web address] for contact info under Cardiology/STEMI.  Jacalyn Lefevre, PA-C  01/16/2022 9:49 AM  EP Attending  Patient seen and examined. Agree with the findings as noted above. The patient presents with pleuritic chest pain 2 days after PPM insertion which started yesterday. He notes the every inspiration he takes results in chest pain. No position makes it better. He has a small pericardial effusion. His CXR demonstrates no obvious change in the lead position and his numbers are ok. As he is in atrial fib we cannot assess his RA pacing threshold. The RA impedence is down 100 ohms of unknown significance. He is in atrial fib today but is asymptomatic. He has RVR. Exam demonstrates a pleasant well appearing man, NAD. Lungs are clear. CV reveals an IRIR rhythm. Ext with no edema. Tele atrial fib with a RVR.  A/P Probable PM lead microperforation - I have discussed the treatment options and recommend PM lead revision. I have reviewed the indications/risks/benefits/goals/expectations and he wishes to proceed. PAF - we will need to hold off on [facility] for  a few more days to reduce the risk of bleeding.  Carleene Overlie Rosaisela Jamroz,MD

## 2022-01-16 NOTE — Discharge Instructions (Signed)
After Your Lead Revision  Do not lift your arm above shoulder height for 1 week after your procedure. After 7 days, you may progress as below.  You should remove your sling 24 hours after your procedure, unless otherwise instructed by your provider.     Friday January 23, 2022  Saturday January 24, 2022 Sunday January 25, 2022 Monday January 26, 2022   Do not lift, push, pull, or carry anything over 10 pounds with the affected arm until 6 weeks (Friday February 27, 2022) after your procedure.   Do not drive until your wound check or until instructed by your healthcare provider that you are safe to do so.   Monitor your surgical site for redness, swelling, and drainage. Call the device clinic at [phone] if you experience these symptoms or fever/chills.  If your incision is sealed with Steri-strips or staples. You may shower 7 days after your procedure and wash your incision with soap and water as long as it is healed. If your incision is closed with Dermabond/Surgical glue. You may shower 1 day after your pacemaker implant and wash your incision with soap and water. Avoid lotions, ointments, or perfumes over your incision until it is well-healed.  You may use a hot tub or a pool AFTER your wound check appointment if the incision is completely closed.   Your cardiac device may be MRI compatible. We will discuss this at your office visit/Wound check  Remote monitoring is used to monitor your cardiac device from home. This monitoring is scheduled every 91 days by our office. It allows [place] to keep an eye on the functioning of your device to ensure it is working properly. You will routinely see your Electrophysiologist annually (more often if necessary).   Implantable Cardiac Device Lead Replacement, Care After This sheet gives you information about how to care for yourself after your procedure. Your health care provider may also give you more specific instructions. If you have problems or questions, contact  your health care provider. What can I expect after the procedure? After your procedure, it is common to have: Mild discomfort at the incision site. A small amount of drainage or bleeding at the incision site. This is usually no more than a spot. Follow these instructions at home: Incision care        Follow instructions from your health care provider about how to take care of your incision. Make sure you: Leave stitches (sutures), skin glue, or adhesive strips in place. These skin closures may need to stay in place for 2 weeks or longer. If adhesive strip edges start to loosen and curl up, you may trim the loose edges. Do not remove adhesive strips completely unless your health care provider tells you to do that. Check your incision area every day for signs of infection. Check for: More redness, swelling, or pain. More fluid or blood. Warmth. Pus or a bad smell. Electric and Optician, dispensing cell phones should be kept 12 inches (30 cm) away from the cardiac device when they are on. When talking on a cell phone, use the ear on the opposite side of your cardiac device. Do not place a cell phone in a pocket next to the cardiac device. Household appliances do not interfere with modern-day cardiac device. Medicines Take over-the-counter and prescription medicines only as told by your health care provider. General instructions Do not raise the arm on the side of your procedure higher than your shoulder for at least 7 days. Except  for this restriction, continue to use your arm as normal to prevent problems. Do not take baths, swim, or use a hot tub until your health care provider says it is okay to do so. You may shower as directed by your health care provider. Do not lift anything that is heavier than 10 lb (4.5 kg) for 6 weeks or the limit that your health care provider tells you, until he or she says that it is safe. Return to your normal activities after 2 weeks, or as told by your  health care provider. Ask your health care provider what activities are safe for you. Keep all follow-up visits as told by your health care provider. This is important. Contact a health care provider if: You have more redness, swelling, or pain around your incision. You have more fluid or blood coming from your incision. Your incision feels warm to the touch. You have pus or a bad smell coming from your incision. You have a fever. The arm or hand on the side of the cardiac device becomes swollen. The symptoms you had before your procedure are not getting better. Get help right away if: You develop chest pain. You feel like you will faint. You feel light-headed. You faint. Summary Check your incision area every day for signs of infection, such as more fluid or blood. A small amount of drainage or bleeding at the incision site is normal. Do not raise the arm on the side of your procedure higher than your shoulder for at least 5 days, or as long as directed by your health care provider. Digital cell phones should be kept 12 inches (30 cm) away from the cardiac device when they are on. When talking on a cell phone, use the ear on the opposite side of your cardiac device. If the symptoms that led to having your lead replaced are not getting better, contact your health care provider. This information is not intended to replace advice given to you by your health care provider. Make sure you discuss any questions you have with your health care provider.

## 2022-01-16 NOTE — ED Provider Notes (Signed)
[facility] EMERGENCY DEPARTMENT Provider Note   CSN: [id number] Arrival date & time: 01/16/22  7342     History  Chief Complaint  Patient presents with   Chest Pain    Edward Butler is a 81 y.o. 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 Caryl Comes.    Presents to the emergency department with complaint of chest pain.  Patient reports that chest pain started yesterday evening at 9 PM.  Pain has been intermittent since then.  Patient has noticed that the pain is consistently brought on when taking a deep breath.  Patient does endorse some shortness of breath as he cannot take a full deep breath.  No associated nausea, vomiting, diaphoresis, palpitations, lightheadedness or syncope.  Patient denies any improvement in chest pain after receiving nitroglycerin and aspirin with EMS.  Patient reports that he has not removed Steri-Strips from procedure site.  Has not noticed any significant swelling or discharge.  Has not had any significant pain to procedure site.  Patient denies any fever, chills, cough, hemoptysis, palpitations, leg swelling or tenderness, history of DVT/PE, abdominal pain, nausea, vomiting, diarrhea, dysuria, hematuria, urinary urgency, urinary frequency.   Chest Pain Associated symptoms: shortness of breath   Associated symptoms: no abdominal pain, no back pain, no dizziness, no fever, no headache, no nausea, no palpitations and no vomiting        Home Medications Prior to Admission medications   Medication Sig Start Date End Date Taking? Authorizing Provider  Accu-Chek FastClix Lancets MISC Use to check blood sugar once daily. E11.9 02/26/21   Elsie Stain, MD  acetaminophen (TYLENOL) 500 MG tablet Take 1,000 mg by mouth daily as needed for moderate pain.    [provider]  apixaban (ELIQUIS) 5 MG TABS tablet Take 5 mg by  mouth 2 (two) times daily. 07/13/19   [provider]  Blood Glucose Monitoring Suppl (ACCU-CHEK GUIDE ME) w/Device KIT USE AS DIRECTED 09/19/21   Elsie Stain, MD  Calcium Carbonate-Vitamin D (CALCIUM 600+D PO) Take 2 tablets by mouth daily.    [provider]  Carboxymethylcellulose Sodium (THERATEARS OP) Place 1 drop into both eyes daily as needed (dry eyes).    [provider]  cholecalciferol (VITAMIN D3) 25 MCG (1000 UNIT) tablet Take 1,000 Units by mouth daily.    [provider]  enalapril (VASOTEC) 5 MG tablet Take 5 mg by mouth daily. 09/28/18   [provider]  finasteride (PROSCAR) 5 MG tablet TAKE 1 TABLET EVERY DAY 09/19/21   Elsie Stain, MD  fluticasone [facility]) 50 MCG/ACT nasal spray Place 2 sprays into both nostrils daily as needed for allergies.    [provider]  glucose blood (ACCU-CHEK GUIDE) test strip USE TO CHECK BLOOD SUGAR ONCE DAILY. 09/19/21   Elsie Stain, MD  levothyroxine (SYNTHROID) 25 MCG tablet Take 25 mcg by mouth daily before breakfast. 12/03/18   [provider]  magnesium oxide (MAG-OX) 400 MG tablet Take 400 mg by mouth daily. 12/13/18   [provider]  metFORMIN (GLUCOPHAGE-XR) 500 MG 24 hr tablet Take 2 tablets (1,000 mg total) by mouth 2 (two) times daily. 09/19/21   Elsie Stain, MD  simvastatin (ZOCOR) 10 MG tablet Take 10 mg by mouth daily. 09/28/18   [provider]  verapamil (CALAN-SR) 240 MG CR tablet Take 1 tablet (240 mg total) by mouth daily. 11/21/21  Deboraha Sprang, MD      Allergies    Patient has no known allergies.    Review of Systems   Review of Systems  Constitutional:  Negative for chills and fever.  Eyes:  Negative for visual disturbance.  Respiratory:  Positive for shortness of breath.   Cardiovascular:  Positive for chest pain. Negative for palpitations and leg swelling.  Gastrointestinal:  Negative for abdominal pain, diarrhea,  nausea and vomiting.  Genitourinary:  Negative for difficulty urinating, dysuria, hematuria and urgency.  Musculoskeletal:  Negative for back pain and neck pain.  Skin:  Negative for color change and rash.  Neurological:  Negative for dizziness, syncope, light-headedness and headaches.  Psychiatric/Behavioral:  Negative for confusion.     Physical Exam Updated Vital Signs Temp 99.2 F (37.3 C) (Oral)  Physical Exam Vitals and nursing note reviewed.  Constitutional:      General: He is not in acute distress.    Appearance: He is not ill-appearing, toxic-appearing or diaphoretic.  HENT:     Head: Normocephalic.  Eyes:     General: No scleral icterus.       Right eye: No discharge.        Left eye: No discharge.  Cardiovascular:     Rate and Rhythm: Normal rate.  Pulmonary:     Effort: Pulmonary effort is normal. No tachypnea, bradypnea or respiratory distress.     Breath sounds: Normal breath sounds. No stridor.  Chest:     Chest wall: Lacerations present. No mass, deformity, swelling, tenderness, crepitus or edema.     Comments: Incision site to left upper chest.  Incision is clean, dry, and intact with no surrounding erythema, rash, or purulent discharge. Abdominal:     General: Abdomen is flat. There is no distension. There are no signs of injury.     Palpations: Abdomen is soft. There is no mass or pulsatile mass.     Tenderness: There is no abdominal tenderness. There is no guarding or rebound.  Musculoskeletal:     Right lower leg: No swelling, deformity, lacerations, tenderness or bony tenderness. No edema.     Left lower leg: No swelling, deformity, lacerations, tenderness or bony tenderness. No edema.  Skin:    General: Skin is warm and dry.  Neurological:     General: No focal deficit present.     Mental Status: He is alert.  Psychiatric:        Behavior: Behavior is cooperative.     ED Results / Procedures / Treatments   Labs (all labs ordered are listed,  but only abnormal results are displayed) Labs Reviewed  COMPREHENSIVE METABOLIC PANEL - Abnormal; Notable for the following components:      Result Value   CO2 19 (*)    Glucose, Bld 127 (*)    Total Protein 5.9 (*)    AST 14 (*)    All other components within normal limits  TROPONIN I (HIGH SENSITIVITY) - Abnormal; Notable for the following components:   Troponin I (High Sensitivity) 23 (*)    All other components within normal limits  TROPONIN I (HIGH SENSITIVITY) - Abnormal; Notable for the following components:   Troponin I (High Sensitivity) 19 (*)    All other components within normal limits  CULTURE, BLOOD (ROUTINE X 2)  CULTURE, BLOOD (ROUTINE X 2)  URINE CULTURE  CBC WITH DIFFERENTIAL/PLATELET  LACTIC ACID, PLASMA  URINALYSIS, ROUTINE W REFLEX MICROSCOPIC    EKG EKG Interpretation  Date/Time:  Friday  January 16 2022 06:55:30 EDT Ventricular Rate:  79 PR Interval:  160 QRS Duration: 135 QT Interval:  421 QTC Calculation: 483 R Axis:   -71 Text Interpretation: Sinus rhythm 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 EXAM: CT ANGIOGRAPHY CHEST WITH CONTRAST TECHNIQUE: Multidetector CT imaging of the chest was performed using the standard protocol during bolus administration of intravenous contrast. Multiplanar CT image reconstructions and MIPs were obtained to evaluate the vascular anatomy. RADIATION DOSE REDUCTION: This exam was performed according to the departmental dose-optimization program which includes automated exposure control, adjustment of the mA and/or kV according to patient size and/or use of iterative reconstruction technique. CONTRAST:  17m OMNIPAQUE IOHEXOL 350 MG/ML SOLN COMPARISON:  Chest radiograph 01/16/2022 FINDINGS: Cardiovascular: Pulmonary arterial  opacification is adequate without evidence of emboli. There is thoracic aortic atherosclerosis without aneurysm. The heart is enlarged with extensive coronary atherosclerosis, and there is a small pericardial effusion. A recently placed left subclavian pacemaker is noted with gas and swelling in the regional soft tissues of the left chest wall and with leads terminating in the right atrium and right ventricle. Mediastinum/Nodes: No enlarged axillary, mediastinal, or hilar lymph nodes. Small sliding hiatal hernia. Unremarkable thyroid. Lungs/Pleura: No pleural effusion or pneumothorax. Mild atelectasis in the lung bases. 4 mm triangular subpleural nodule in the right middle lobe abutting the minor fissure most compatible with a benign intrapulmonary lymph node and for which no follow-up imaging is recommended. Mild bronchial wall thickening. Upper Abdomen: Status post cholecystectomy. 9 mm centrally low-density, peripherally calcified lesion anteriorly in the left hepatic lobe, too small to fully characterize. Musculoskeletal: No acute osseous abnormality or suspicious osseous lesion. Review of the MIP images confirms the above findings. IMPRESSION: 1. No evidence of pulmonary emboli. 2. Mild bronchial wall thickening and mild atelectasis in the lung bases. 3. Cardiomegaly with extensive coronary atherosclerosis and small pericardial effusion. 4. Small sliding hiatal hernia. 5. Aortic Atherosclerosis (ICD10-I70.0). Electronically Signed   By: ALogan BoresM.D.   On: 01/16/2022 08:18   DG Chest Portable 1 View  Result Date: 01/16/2022 CLINICAL DATA:  81year old male with history of chest pain. EXAM: PORTABLE CHEST 1 VIEW COMPARISON:  Chest x-ray 01/14/2022. FINDINGS: Lung volumes are slightly low. Bibasilar opacities favored to reflect subsegmental atelectasis and/or chronic scarring. No confluent consolidative airspace disease. No pleural effusions. No pneumothorax. No evidence of pulmonary edema. Heart size is  mildly enlarged. The patient is rotated to the right on today's exam, resulting in distortion of the mediastinal contours and reduced diagnostic sensitivity and specificity for mediastinal pathology. Atherosclerotic calcifications are noted in the thoracic aorta. Left-sided pacemaker device in place with lead tips projecting over the expected location of the right atrium and right ventricle. IMPRESSION: 1. Low lung volumes with bibasilar areas of subsegmental atelectasis and/or chronic scarring. 2. Mild cardiomegaly. 3. Aortic atherosclerosis. Electronically Signed   By: DVinnie LangtonM.D.   On: 01/16/2022 07:48   DG Chest 2 View  Result Date: 01/14/2022 CLINICAL DATA:  Cardiac device in-situ. EXAM: CHEST - 2 VIEW COMPARISON:  Chest two views 10/29/2021 FINDINGS: Cardiac silhouette is again mildly enlarged. Mild-to-moderate calcification within aortic arch. New left chest wall cardiac pacer with leads overlying the right atrium and right ventricle. Unchanged mild bibasilar linear likely scarring. No pleural effusion or pneumothorax. Mild multilevel  degenerative disc changes of the thoracic spine. IMPRESSION: 1. New left chest wall cardiac pacer.  No pneumothorax. 2. No acute lung process. Electronically Signed   By: Yvonne Kendall M.D.   On: 01/14/2022 14:18    Procedures .Critical Care  Performed by: Loni Beckwith, PA-C Authorized by: Loni Beckwith, PA-C   Critical care provider statement:    Critical care time (minutes):  30   Critical care was necessary to treat or prevent imminent or life-threatening deterioration of the following conditions:  Circulatory failure   Critical care was time spent personally by me on the following activities:  Development of treatment plan with patient or surrogate, discussions with consultants, evaluation of patient's response to treatment, examination of patient, ordering and review of laboratory studies, ordering and review of radiographic studies,  ordering and performing treatments and interventions, pulse oximetry, re-evaluation of patient's condition, review of old charts and obtaining history from patient or surrogate   Care discussed with: admitting provider   Comments:     A-fib with RVR     Medications Ordered in ED Medications  verapamil (CALAN-SR) CR tablet 240 mg (240 mg Oral Given 01/16/22 0933)  diltiazem (CARDIZEM) injection 20 mg (has no administration in time range)  lactated ringers bolus 1,000 mL (0 mLs Intravenous Stopped 01/16/22 0902)  iohexol (OMNIPAQUE) 350 MG/ML injection 50 mL (50 mLs Intravenous Contrast Given 01/16/22 0804)    ED Course/ Medical Decision Making/ A&P Clinical Course as of 01/16/22 1133  Fri Jan 16, 2022  0936 I spoke to cardiology PA who advised that EP team will review patient's case and make recommendations. [PB]    Clinical Course User Index [PB] Loni Beckwith, PA-C                           Medical Decision Making Amount and/or Complexity of Data Reviewed Labs: ordered. Radiology: ordered.  Risk Prescription drug management. Decision regarding hospitalization.   Alert 81 year old male in no acute distress, nontoxic-appearing.  Presents to the ED with a chief complaint of chest pain.  Information is obtained from patient.  I reviewed patient's past medical records including previous provider notes from specialist, labs and imaging.  Patient has medical history as outlined in HPI which complicates care.  Patient noted to have A-fib with ventricular rate ranging from 120s to 130s.  With recent surgery concern for PE, pericarditis, pneumothorax, or infection.  Will obtain CTA chest, troponin, labs, chest x-ray, and EKG.  Patient was discussed with and evaluated by Dr. Kathrynn Humble.  I personally viewed interpret patient's EKG.  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  I personally viewed interpret patient's chest x-ray.  Imaging shows cardiomegaly  with no pneumothorax or focal infiltrate/fluid collection.  I personally viewed interpret patient's lab results.  Pertinent findings include: -Troponin 23 -CBC unremarkable -Bicarb 19 -Lactic acid 1.7 -UA unremarkable  I personally viewed and interpreted patient's CTA of chest.  Agree with radiology interpretation of no evidence of pulmonary emboli.  Mild bronchial wall thickening and mild atelectasis in the lung bases.  Cardiomegaly with extensive coronary arterial sclerosis and small pericardial effusion.  With no leukocytosis or obvious signs of infection low suspicion for sepsis at this time.  Initial troponin is 23, will recheck.  Small pericardial effusion seen on CT imaging likely secondary to his recent pacemaker implantation.  Patient continues to have A-fib with RVR with heart rate ranging from 100s to 130s.  Will administer Cardizem bolus and hold verapamil.  Blood pressure was found to be 101/85, will hold Cardizem bolus at this time.    Patient was seen by cardiology EP team.  Recommendations are to bring patient to the operating room for lead revision.  Patient will be admitted after this procedure.        Final Clinical Impression(s) / ED Diagnoses Final diagnoses:  Atrial fibrillation, rapid [facility])    Rx / DC Orders ED Discharge Orders     None         Dyann Ruddle 01/16/22 1135    Varney Biles, MD 01/17/22 1125

## 2022-01-17 ENCOUNTER — Ambulatory Visit (HOSPITAL_COMMUNITY): Payer: Medicare HMO

## 2022-01-17 DIAGNOSIS — T82120A Displacement of cardiac electrode, initial encounter: Secondary | ICD-10-CM | POA: Diagnosis not present

## 2022-01-17 DIAGNOSIS — I495 Sick sinus syndrome: Secondary | ICD-10-CM | POA: Diagnosis not present

## 2022-01-17 DIAGNOSIS — I2510 Atherosclerotic heart disease of native coronary artery without angina pectoris: Secondary | ICD-10-CM | POA: Diagnosis not present

## 2022-01-17 DIAGNOSIS — J9811 Atelectasis: Secondary | ICD-10-CM | POA: Diagnosis not present

## 2022-01-17 DIAGNOSIS — I480 Paroxysmal atrial fibrillation: Secondary | ICD-10-CM | POA: Diagnosis not present

## 2022-01-17 DIAGNOSIS — Z8673 Personal history of transient ischemic attack (TIA), and cerebral infarction without residual deficits: Secondary | ICD-10-CM | POA: Diagnosis not present

## 2022-01-17 DIAGNOSIS — I4510 Unspecified right bundle-branch block: Secondary | ICD-10-CM | POA: Diagnosis not present

## 2022-01-17 DIAGNOSIS — Z950 Presence of cardiac pacemaker: Secondary | ICD-10-CM | POA: Diagnosis not present

## 2022-01-17 DIAGNOSIS — T82190A Other mechanical complication of cardiac electrode, initial encounter: Secondary | ICD-10-CM | POA: Diagnosis not present

## 2022-01-17 DIAGNOSIS — I10 Essential (primary) hypertension: Secondary | ICD-10-CM | POA: Diagnosis not present

## 2022-01-17 DIAGNOSIS — E119 Type 2 diabetes mellitus without complications: Secondary | ICD-10-CM | POA: Diagnosis not present

## 2022-01-17 LAB — GLUCOSE, CAPILLARY
Glucose-Capillary: 236 mg/dL — ABNORMAL HIGH (ref 70–99)
Glucose-Capillary: 279 mg/dL — ABNORMAL HIGH (ref 70–99)

## 2022-01-17 LAB — URINE CULTURE: Culture: NO GROWTH

## 2022-01-17 MED ORDER — SODIUM CHLORIDE 0.9 % IV SOLN: Freq: Once | INTRAVENOUS | Status: AC

## 2022-01-17 MED ORDER — INSULIN ASPART 100 UNIT/ML IJ SOLN
0.0000 [IU] | Freq: Three times a day (TID) | INTRAMUSCULAR | Status: DC
  Administered 2022-01-17: 5 [IU] via SUBCUTANEOUS

## 2022-01-17 NOTE — Discharge Summary (Cosign Needed)
Discharge Summary    Patient ID: Edward Butler MRN: [id number]; DOB: 1940/10/08  Admit date: 01/16/2022 Discharge date: 01/17/2022  PCP:  Elsie Stain, MD   [facility] Providers Cardiologist:  Dina Rich, MD  Electrophysiologist:  Virl Axe, MD       Discharge Diagnoses    Principal Problem:   Pacemaker complications Active Problems:   Essential hypertension   Chronic anticoagulation   Paroxysmal atrial fibrillation with RVR ([place])   Tachycardia-bradycardia syndrome ([place])    Diagnostic Studies/Procedures    PPM Lead Revision Conclusion: Successful removal of a previously implanted RV pacing lead and repositioning of the right atrial pacing lead with insertion of a new right ventricular pacing lead in a patient who developed severe pleuritic chest pain which was incessant 2 days after initial pacemaker insertion consistent with microperforation.   Cristopher Peru, MD _____________   History of Present Illness     Edward Butler is a 81 y.o. 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  He recently underwent outpatient PPM implantation 01/14/2022 by Dr. Caryl Comes. He was called 6/15 by device clinic and was doing well without complaints or pain, planned to start [place] back 01/17/22 as previously ordered. However later the evening of 01/15/22, he began to have pleuritic type CP that persisted into the morning so came to the ED on 01/16/22. In the ED, CXR showed low lung volumes, and subsegmental atelectasis; mild cardiomegaly, aortic atherosclerosis. CT angio with no PE, small pericardial effusion. Labs showed K 4.4, Cr 0.92, WBG 8.7, Hgb 14.8, HS trop 23, Lactic Acid 1.7. Temp 99.2 on arrival. EKG showed AF RVR. PPM site looked stable. Steri-strips had been  removed from ED provider. He was treated with IV diltiazem. As he was in atrial fib, could not assess his RA pacing threshold. The RA impedence was down 100 ohms of unknown significance. He was admitted to the EP service for probable PM lead microperforation.     Hospital Course     He was taken to the EP lab and underwent successful removal of a previously implanted RV pacing lead and repositioning of the right atrial pacing lead with insertion of a new right ventricular pacing lead. His tachycardia resolved. His home medication, to include verapamil, was resumed. This morning his pleuritic pain was better and heart rate had improved. He did have soft blood pressure this morning after verapamil. Dr. Lovena Le recommended IV fluids and reassessment, with discharge if this stabilized. His BP has improved. CXR showed bibasilar atelectasis and mild blunting of the costophrenic angles likely due to trace pleural effusions. No pneumothorax or evidence of complications. Respiratory rate has been intermittently above normal but patient has tendency to take shallow breaths and this has been observed per VS review in other admissions as well. Most recent RR 24. He feels great this afternoon and is eager to go home, with normal O2 sat and clear lungs. Dr. Lovena Le reviewed the above and does not feel any additional testing is needed. He has seen and examined the patient today and feels he is stable for discharge. He feels the patient can restart his Eliquis (apixaban) on 01/21/22. He reports no contrast was used so did not need to hold metformin. He otherwise did not advise any other new medication changes. EP team has outlined  post-revision instructions on AVS and arranged f/u with wound check and usual EP f/u. The patient also has a follow-up visit scheduled with Dr. Harrington Challenger in August.  _____________  Discharge Vitals Blood pressure 117/68, pulse 74, temperature 97.9 F (36.6 C), temperature source Oral, resp. Rate  presently 24, SpO2 95% RA. There were no vitals filed for this visit.  Labs & Radiologic Studies    CBC Recent Labs    01/16/22 0659  WBC 8.7  NEUTROABS 5.9  HGB 14.8  HCT 44.7  MCV 92.5  PLT 122   Basic Metabolic Panel Recent Labs    01/16/22 0659  NA 139  K 4.4  CL 109  CO2 19*  GLUCOSE 127*  BUN 14  CREATININE 0.92  CALCIUM 9.0   Liver Function Tests Recent Labs    01/16/22 0659  AST 14*  ALT 11  ALKPHOS 40  BILITOT 1.1  PROT 5.9*  ALBUMIN 3.6   No results for input(s): "LIPASE", "AMYLASE" in the last 72 hours. High Sensitivity Troponin:   Recent Labs  Lab 01/16/22 0659 01/16/22 0942  TROPONINIHS 23* 19*     _____________  DG Chest 2 View  Result Date: 01/17/2022 CLINICAL DATA:  Pacemaker placement EXAM: CHEST - 2 VIEW COMPARISON:  Chest x-ray dated February 15, 2022 FINDINGS: Unchanged cardiac and mediastinal contours. Left chest wall dual lead pacer with unchanged lead position. Mild linear bibasilar opacities, likely due to scarring or atelectasis. No focal consolidation. Mild blunting of the costophrenic angles, likely due to trace pleural effusions. No evidence of pneumothorax. IMPRESSION: 1. Bibasilar atelectasis. 2. No mild blunting of the costophrenic angles, likely due to trace pleural effusions. Electronically Signed   By: Yetta Glassman M.D.   On: 01/17/2022 14:14   (spoke with radiologist about addending report to fix discrepancy with the word "no" mild blunting, addendum forthcoming)  EP PPM/ICD IMPLANT  Result Date: 01/16/2022 Conclusion: Successful removal of a previously implanted RV pacing lead and repositioning of the right atrial pacing lead with insertion of a new right ventricular pacing lead in a patient who developed severe pleuritic chest pain which was incessant 2 days after initial pacemaker insertion consistent with microperforation. Cristopher Peru, MD   CT Angio Chest PE W and/or Wo Contrast  Result Date: 01/16/2022 CLINICAL  DATA:  Pulmonary embolism (PE) suspected, high prob. EXAM: CT ANGIOGRAPHY CHEST WITH CONTRAST TECHNIQUE: Multidetector CT imaging of the chest was performed using the standard protocol during bolus administration of intravenous contrast. Multiplanar CT image reconstructions and MIPs were obtained to evaluate the vascular anatomy. RADIATION DOSE REDUCTION: This exam was performed according to the departmental dose-optimization program which includes automated exposure control, adjustment of the mA and/or kV according to patient size and/or use of iterative reconstruction technique. CONTRAST:  74m OMNIPAQUE IOHEXOL 350 MG/ML SOLN COMPARISON:  Chest radiograph 01/16/2022 FINDINGS: Cardiovascular: Pulmonary arterial opacification is adequate without evidence of emboli. There is thoracic aortic atherosclerosis without aneurysm. The heart is enlarged with extensive coronary atherosclerosis, and there is a small pericardial effusion. A recently placed left subclavian pacemaker is noted with gas and swelling in the regional soft tissues of the left chest wall and with leads terminating in the right atrium and right ventricle. Mediastinum/Nodes: No enlarged axillary, mediastinal, or hilar lymph nodes. Small sliding hiatal hernia. Unremarkable thyroid. Lungs/Pleura: No pleural effusion or pneumothorax. Mild atelectasis in the lung bases. 4 mm triangular subpleural nodule in the right middle lobe abutting the minor fissure most compatible with a benign intrapulmonary  lymph node and for which no follow-up imaging is recommended. Mild bronchial wall thickening. Upper Abdomen: Status post cholecystectomy. 9 mm centrally low-density, peripherally calcified lesion anteriorly in the left hepatic lobe, too small to fully characterize. Musculoskeletal: No acute osseous abnormality or suspicious osseous lesion. Review of the MIP images confirms the above findings. IMPRESSION: 1. No evidence of pulmonary emboli. 2. Mild bronchial wall  thickening and mild atelectasis in the lung bases. 3. Cardiomegaly with extensive coronary atherosclerosis and small pericardial effusion. 4. Small sliding hiatal hernia. 5. Aortic Atherosclerosis (ICD10-I70.0). Electronically Signed   By: Logan Bores M.D.   On: 01/16/2022 08:18   DG Chest Portable 1 View  Result Date: 01/16/2022 CLINICAL DATA:  81 year old male with history of chest pain. EXAM: PORTABLE CHEST 1 VIEW COMPARISON:  Chest x-ray 01/14/2022. FINDINGS: Lung volumes are slightly low. Bibasilar opacities favored to reflect subsegmental atelectasis and/or chronic scarring. No confluent consolidative airspace disease. No pleural effusions. No pneumothorax. No evidence of pulmonary edema. Heart size is mildly enlarged. The patient is rotated to the right on today's exam, resulting in distortion of the mediastinal contours and reduced diagnostic sensitivity and specificity for mediastinal pathology. Atherosclerotic calcifications are noted in the thoracic aorta. Left-sided pacemaker device in place with lead tips projecting over the expected location of the right atrium and right ventricle. IMPRESSION: 1. Low lung volumes with bibasilar areas of subsegmental atelectasis and/or chronic scarring. 2. Mild cardiomegaly. 3. Aortic atherosclerosis. Electronically Signed   By: Vinnie Langton M.D.   On: 01/16/2022 07:48   DG Chest 2 View  Result Date: 01/14/2022 CLINICAL DATA:  Cardiac device in-situ. EXAM: CHEST - 2 VIEW COMPARISON:  Chest two views 10/29/2021 FINDINGS: Cardiac silhouette is again mildly enlarged. Mild-to-moderate calcification within aortic arch. New left chest wall cardiac pacer with leads overlying the right atrium and right ventricle. Unchanged mild bibasilar linear likely scarring. No pleural effusion or pneumothorax. Mild multilevel degenerative disc changes of the thoracic spine. IMPRESSION: 1. New left chest wall cardiac pacer.  No pneumothorax. 2. No acute lung process.  Electronically Signed   By: Yvonne Kendall M.D.   On: 01/14/2022 14:18    Disposition   Pt is being discharged home today in good condition.  Follow-up Plans & Appointments     Follow-up Information     [facility].   Specialty: Cardiology Why: Keep follow-up as outlined below with wound check on 01/28/22, follow-up with Dr. Harrington Challenger on 03/09/22, and follow-up with Dr. Caryl Comes on 04/28/22. Contact information: [address] [place] [phone]               Discharge Instructions     Diet - low sodium heart healthy   Complete by: As directed    Discharge instructions   Complete by: As directed    You can restart your Eliquis (apixaban) on 01/21/22.   Discharge wound care:   Complete by: As directed    See post-lead revision instructions later in AVS   Increase activity slowly   Complete by: As directed    Please see end of your After-Visit Summary for instructions on wound care, activity, and bathing.       Discharge Medications   Allergies as of 01/17/2022   No Known Allergies      Medication List     TAKE these medications    Accu-Chek FastClix Lancets Misc Use to check blood sugar once daily. E11.9  Accu-Chek Guide Me w/Device Kit USE AS DIRECTED   Accu-Chek Guide test strip Generic drug: glucose blood USE TO CHECK BLOOD SUGAR ONCE DAILY.   acetaminophen 500 MG tablet Commonly known as: TYLENOL Take 1,000 mg by mouth daily as needed for moderate pain.   apixaban 5 MG Tabs tablet Commonly known as: ELIQUIS Take 5 mg by mouth 2 (two) times daily.   CALCIUM 600+D PO Take 2 tablets by mouth daily.   cholecalciferol 25 MCG (1000 UNIT) tablet Commonly known as: VITAMIN D3 Take 1,000 Units by mouth daily.   enalapril 5 MG tablet Commonly known as: VASOTEC Take 5 mg by mouth daily.   finasteride 5 MG tablet Commonly known as: PROSCAR TAKE 1 TABLET EVERY DAY   levothyroxine  25 MCG tablet Commonly known as: SYNTHROID Take 25 mcg by mouth daily before breakfast.   magnesium oxide 400 MG tablet Commonly known as: MAG-OX Take 400 mg by mouth daily.   metFORMIN 500 MG 24 hr tablet Commonly known as: GLUCOPHAGE-XR Take 2 tablets (1,000 mg total) by mouth 2 (two) times daily.   simvastatin 10 MG tablet Commonly known as: ZOCOR Take 10 mg by mouth daily.   verapamil 240 MG CR tablet Commonly known as: CALAN-SR Take 1 tablet (240 mg total) by mouth daily.               Discharge Care Instructions  (From admission, onward)           Start     Ordered   01/17/22 0000  Discharge wound care:       Comments: See post-lead revision instructions later in AVS   01/17/22 1131               Outstanding Labs/Studies   N/A  Duration of Discharge Encounter   Greater than 30 minutes including physician time.  Signed, Charlie Pitter, PA-C 01/17/2022, 2:48 PM  EP Attending  Patient seen and examined. See my progress note as well. He is ready for DC and will undergo usual followup. His PPM is working normally.  Carleene Overlie Taylor,MD

## 2022-01-17 NOTE — Progress Notes (Signed)
   01/17/22 0400  Assess: MEWS Score  Temp 99.4 F (37.4 C)  BP 110/85  MAP (mmHg) 95  Pulse Rate (!) 110  ECG Heart Rate (!) 114  Resp 20  SpO2 95 %  O2 Device Room Air  Assess: MEWS Score  MEWS Temp 0  MEWS Systolic 0  MEWS Pulse 2  MEWS RR 0  MEWS LOC 0  MEWS Score 2  MEWS Score Color Yellow  Assess: if the MEWS score is Yellow or Red  Were vital signs taken at a resting state? Yes  Focused Assessment No change from prior assessment  Does the patient meet 2 or more of the SIRS criteria? No  MEWS guidelines implemented *See Row Information* Yes  Treat  Pain Scale 0-10  Pain Score 0  Take Vital Signs  Increase Vital Sign Frequency  Yellow: Q 2hr X 2 then Q 4hr X 2, if remains yellow, continue Q 4hrs  Escalate  MEWS: Escalate Yellow: discuss with charge nurse/RN and consider discussing with provider and RRT  Notify: Charge Nurse/RN  Name of Charge Nurse/RN Notified Amor, RN  Date Charge Nurse/RN Notified 01/17/22  Time Charge Nurse/RN Notified 0405  Document  Patient Outcome Other (Comment) (stable. Asymptomatic)  Progress note created (see row info) Yes  Assess: SIRS CRITERIA  SIRS Temperature  0  SIRS Pulse 1  SIRS Respirations  0  SIRS WBC 0  SIRS Score Sum  1

## 2022-01-17 NOTE — Progress Notes (Addendum)
Progress Note  Patient Name: Edward Butler Date of Encounter: 01/17/2022  Primary Cardiologist: Dina Rich, MD   Subjective   No additional pleuritic chest pain.   Inpatient Medications    Scheduled Meds:  cholecalciferol  1,000 Units Oral Daily   diltiazem  20 mg Intravenous Once   enalapril  5 mg Oral Daily   finasteride  5 mg Oral Daily   levothyroxine  25 mcg Oral Q0600   magnesium oxide  400 mg Oral Daily   [START ON 01/18/2022] metFORMIN  1,000 mg Oral BID WC   simvastatin  10 mg Oral Daily   verapamil  240 mg Oral Daily   Continuous Infusions:   ceFAZolin (ANCEF) IV 1 g (01/17/22 0245)   PRN Meds: acetaminophen, ondansetron (ZOFRAN) IV   Vital Signs    Vitals:   01/17/22 0534 01/17/22 0846 01/17/22 0857 01/17/22 0905  BP: 111/80 (!) 82/58 (!) 86/55 (!) 95/58  Pulse: 70 (!) 59 (!) 59 62  Resp: (!) 25 (!) 32 (!) 28 (!) 31  Temp: 99.2 F (37.3 C) (P) 97.7 F (36.5 C)    TempSrc: Oral     SpO2: 94% 94% 96% 95%    Intake/Output Summary (Last 24 hours) at 01/17/2022 0912 Last data filed at 01/17/2022 0911 Gross per 24 hour  Intake 828.71 ml  Output 450 ml  Net 378.71 ml   There were no vitals filed for this visit.  Telemetry    Atrial fib with intermittent ventricular pacing - Personally Reviewed  ECG    none - Personally Reviewed  Physical Exam   GEN: No acute distress.   Neck: No JVD Cardiac: IRRR, no murmurs, rubs, or gallops. No hematoma. Respiratory: Clear to auscultation bilaterally. GI: Soft, nontender, non-distended  MS: No edema; No deformity. Neuro:  Nonfocal  Psych: Normal affect   Labs    Chemistry Recent Labs  Lab 01/16/22 0659  NA 139  K 4.4  CL 109  CO2 19*  GLUCOSE 127*  BUN 14  CREATININE 0.92  CALCIUM 9.0  PROT 5.9*  ALBUMIN 3.6  AST 14*  ALT 11  ALKPHOS 40  BILITOT 1.1  GFRNONAA >60  ANIONGAP 11     Hematology Recent Labs  Lab 01/16/22 0659  WBC 8.7  RBC 4.83  HGB 14.8  HCT 44.7  MCV 92.5   MCH 30.6  MCHC 33.1  RDW 14.5  PLT 154    Cardiac EnzymesNo results for input(s): "TROPONINI" in the last 168 hours. No results for input(s): "TROPIPOC" in the last 168 hours.   BNPNo results for input(s): "BNP", "PROBNP" in the last 168 hours.   DDimer No results for input(s): "DDIMER" in the last 168 hours.   Radiology    EP PPM/ICD IMPLANT  Result Date: 01/16/2022 Conclusion: Successful removal of a previously implanted RV pacing lead and repositioning of the right atrial pacing lead with insertion of a new right ventricular pacing lead in a patient who developed severe pleuritic chest pain which was incessant 2 days after initial pacemaker insertion consistent with microperforation. Cristopher Peru, MD   CT Angio Chest PE W and/or Wo Contrast  Result Date: 01/16/2022 CLINICAL DATA:  Pulmonary embolism (PE) suspected, high prob. EXAM: CT ANGIOGRAPHY CHEST WITH CONTRAST TECHNIQUE: Multidetector CT imaging of the chest was performed using the standard protocol during bolus administration of intravenous contrast. Multiplanar CT image reconstructions and MIPs were obtained to evaluate the vascular anatomy. RADIATION DOSE REDUCTION: This exam was performed according to the  departmental dose-optimization program which includes automated exposure control, adjustment of the mA and/or kV according to patient size and/or use of iterative reconstruction technique. CONTRAST:  18m OMNIPAQUE IOHEXOL 350 MG/ML SOLN COMPARISON:  Chest radiograph 01/16/2022 FINDINGS: Cardiovascular: Pulmonary arterial opacification is adequate without evidence of emboli. There is thoracic aortic atherosclerosis without aneurysm. The heart is enlarged with extensive coronary atherosclerosis, and there is a small pericardial effusion. A recently placed left subclavian pacemaker is noted with gas and swelling in the regional soft tissues of the left chest wall and with leads terminating in the right atrium and right ventricle.  Mediastinum/Nodes: No enlarged axillary, mediastinal, or hilar lymph nodes. Small sliding hiatal hernia. Unremarkable thyroid. Lungs/Pleura: No pleural effusion or pneumothorax. Mild atelectasis in the lung bases. 4 mm triangular subpleural nodule in the right middle lobe abutting the minor fissure most compatible with a benign intrapulmonary lymph node and for which no follow-up imaging is recommended. Mild bronchial wall thickening. Upper Abdomen: Status post cholecystectomy. 9 mm centrally low-density, peripherally calcified lesion anteriorly in the left hepatic lobe, too small to fully characterize. Musculoskeletal: No acute osseous abnormality or suspicious osseous lesion. Review of the MIP images confirms the above findings. IMPRESSION: 1. No evidence of pulmonary emboli. 2. Mild bronchial wall thickening and mild atelectasis in the lung bases. 3. Cardiomegaly with extensive coronary atherosclerosis and small pericardial effusion. 4. Small sliding hiatal hernia. 5. Aortic Atherosclerosis (ICD10-I70.0). Electronically Signed   By: ALogan BoresM.D.   On: 01/16/2022 08:18   DG Chest Portable 1 View  Result Date: 01/16/2022 CLINICAL DATA:  81year old male with history of chest pain. EXAM: PORTABLE CHEST 1 VIEW COMPARISON:  Chest x-ray 01/14/2022. FINDINGS: Lung volumes are slightly low. Bibasilar opacities favored to reflect subsegmental atelectasis and/or chronic scarring. No confluent consolidative airspace disease. No pleural effusions. No pneumothorax. No evidence of pulmonary edema. Heart size is mildly enlarged. The patient is rotated to the right on today's exam, resulting in distortion of the mediastinal contours and reduced diagnostic sensitivity and specificity for mediastinal pathology. Atherosclerotic calcifications are noted in the thoracic aorta. Left-sided pacemaker device in place with lead tips projecting over the expected location of the right atrium and right ventricle. IMPRESSION: 1. Low  lung volumes with bibasilar areas of subsegmental atelectasis and/or chronic scarring. 2. Mild cardiomegaly. 3. Aortic atherosclerosis. Electronically Signed   By: DVinnie LangtonM.D.   On: 01/16/2022 07:48    Cardiac Studies   none  Patient Profile     81y.o. male admitted with pleuritic chest pain 2 days after PPM insertion. S/p PM lead revision.  Assessment & Plan    PAF - his rates are better this morning after verapamil. We will hold eliquis until 01/21/22. HTN - his bp a little low this morning after verapamil. We will give fluid bolus and reassess. Pleuritic chest pain - resolved completely. He noted some pins and needles sensation just below his PM. Not related to respiration. We will follow. PM lead revision - we will check his cxr. His device interrogation demonstrates normal device function. Home later today if he is feeling better and bp improved.   For questions or updates, please contact [place] consult [email or web address] for contact info under Cardiology/STEMI.      Signed, GCristopher Peru MD  01/17/2022, 9:12 AM

## 2022-01-17 NOTE — Progress Notes (Signed)
C/O weakness, dizziness and diaphoresis while getting ready to stand up at bedside and go to restroom. BP checked and found to be 82/58 (map = 67). HR = 63. Dr Lovena Le notified. Order received to give NS 250 ml bolus x 1.

## 2022-01-17 NOTE — Plan of Care (Signed)
  Problem: Education: Goal: Knowledge of General Education information will improve Description: Including pain rating scale, medication(s)/side effects and non-pharmacologic comfort measures Outcome: Adequate for Discharge   Problem: Health Behavior/Discharge Planning: Goal: Ability to manage health-related needs will improve Outcome: Adequate for Discharge   Problem: Clinical Measurements: Goal: Ability to maintain clinical measurements within normal limits will improve Outcome: Adequate for Discharge Goal: Will remain free from infection Outcome: Adequate for Discharge Goal: Diagnostic test results will improve Outcome: Adequate for Discharge Goal: Respiratory complications will improve Outcome: Adequate for Discharge Goal: Cardiovascular complication will be avoided Outcome: Adequate for Discharge   Problem: Activity: Goal: Risk for activity intolerance will decrease Outcome: Adequate for Discharge   Problem: Nutrition: Goal: Adequate nutrition will be maintained Outcome: Adequate for Discharge   Problem: Coping: Goal: Level of anxiety will decrease Outcome: Adequate for Discharge   Problem: Elimination: Goal: Will not experience complications related to bowel motility Outcome: Adequate for Discharge Goal: Will not experience complications related to urinary retention Outcome: Adequate for Discharge   Problem: Pain Managment: Goal: General experience of comfort will improve Outcome: Adequate for Discharge   Problem: Safety: Goal: Ability to remain free from injury will improve Outcome: Adequate for Discharge   Problem: Skin Integrity: Goal: Risk for impaired skin integrity will decrease Outcome: Adequate for Discharge   Problem: Education: Goal: Knowledge of cardiac device and self-care will improve Outcome: Adequate for Discharge Goal: Ability to safely manage health related needs after discharge will improve Outcome: Adequate for Discharge Goal:  Individualized Educational Video(s) Outcome: Adequate for Discharge   Problem: Cardiac: Goal: Ability to achieve and maintain adequate cardiopulmonary perfusion will improve Outcome: Adequate for Discharge   Problem: Education: Goal: Ability to describe self-care measures that may prevent or decrease complications (Diabetes Survival Skills Education) will improve Outcome: Adequate for Discharge Goal: Individualized Educational Video(s) Outcome: Adequate for Discharge   Problem: Coping: Goal: Ability to adjust to condition or change in health will improve Outcome: Adequate for Discharge   Problem: Fluid Volume: Goal: Ability to maintain a balanced intake and output will improve Outcome: Adequate for Discharge   Problem: Health Behavior/Discharge Planning: Goal: Ability to identify and utilize available resources and services will improve Outcome: Adequate for Discharge Goal: Ability to manage health-related needs will improve Outcome: Adequate for Discharge   Problem: Metabolic: Goal: Ability to maintain appropriate glucose levels will improve Outcome: Adequate for Discharge   Problem: Nutritional: Goal: Maintenance of adequate nutrition will improve Outcome: Adequate for Discharge Goal: Progress toward achieving an optimal weight will improve Outcome: Adequate for Discharge   Problem: Skin Integrity: Goal: Risk for impaired skin integrity will decrease Outcome: Adequate for Discharge   Problem: Tissue Perfusion: Goal: Adequacy of tissue perfusion will improve Outcome: Adequate for Discharge   

## 2022-01-19 ENCOUNTER — Telehealth: Payer: Self-pay

## 2022-01-19 ENCOUNTER — Encounter (HOSPITAL_COMMUNITY): Payer: Self-pay | Admitting: Internal Medicine

## 2022-01-19 MED FILL — Lidocaine HCl Local Inj 1%: INTRAMUSCULAR | Qty: 60 | Status: AC

## 2022-01-19 NOTE — Telephone Encounter (Signed)
I spoke with the patient to let him know he can take the big bandage off after 24 hours. The steri strips do not come off until wound check appointment.

## 2022-01-21 LAB — CULTURE, BLOOD (ROUTINE X 2)
Culture: NO GROWTH
Culture: NO GROWTH
Special Requests: ADEQUATE

## 2022-01-28 ENCOUNTER — Emergency Department (HOSPITAL_COMMUNITY): Payer: Medicare HMO

## 2022-01-28 ENCOUNTER — Ambulatory Visit (INDEPENDENT_AMBULATORY_CARE_PROVIDER_SITE_OTHER): Payer: Medicare HMO | Admitting: Internal Medicine

## 2022-01-28 ENCOUNTER — Inpatient Hospital Stay (HOSPITAL_COMMUNITY)
Admission: EM | Admit: 2022-01-28 | Discharge: 2022-02-04 | DRG: 314 | Disposition: A | Discharge status: Home / Self Care | Payer: Medicare HMO | Attending: Internal Medicine | Admitting: Internal Medicine

## 2022-01-28 ENCOUNTER — Encounter: Payer: Self-pay | Admitting: Internal Medicine

## 2022-01-28 ENCOUNTER — Ambulatory Visit (INDEPENDENT_AMBULATORY_CARE_PROVIDER_SITE_OTHER): Payer: Medicare HMO

## 2022-01-28 VITALS — BP 112/80 | HR 132 | Ht 66.4 in

## 2022-01-28 DIAGNOSIS — G4733 Obstructive sleep apnea (adult) (pediatric): Secondary | ICD-10-CM | POA: Diagnosis present

## 2022-01-28 DIAGNOSIS — T82198A Other mechanical complication of other cardiac electronic device, initial encounter: Secondary | ICD-10-CM | POA: Diagnosis present

## 2022-01-28 DIAGNOSIS — I350 Nonrheumatic aortic (valve) stenosis: Secondary | ICD-10-CM | POA: Diagnosis not present

## 2022-01-28 DIAGNOSIS — I480 Paroxysmal atrial fibrillation: Secondary | ICD-10-CM | POA: Diagnosis present

## 2022-01-28 DIAGNOSIS — I314 Cardiac tamponade: Secondary | ICD-10-CM | POA: Diagnosis present

## 2022-01-28 DIAGNOSIS — Z7901 Long term (current) use of anticoagulants: Secondary | ICD-10-CM

## 2022-01-28 DIAGNOSIS — D6832 Hemorrhagic disorder due to extrinsic circulating anticoagulants: Secondary | ICD-10-CM | POA: Diagnosis present

## 2022-01-28 DIAGNOSIS — I5033 Acute on chronic diastolic (congestive) heart failure: Secondary | ICD-10-CM | POA: Diagnosis not present

## 2022-01-28 DIAGNOSIS — R079 Chest pain, unspecified: Secondary | ICD-10-CM | POA: Diagnosis not present

## 2022-01-28 DIAGNOSIS — I110 Hypertensive heart disease with heart failure: Secondary | ICD-10-CM | POA: Diagnosis present

## 2022-01-28 DIAGNOSIS — I2510 Atherosclerotic heart disease of native coronary artery without angina pectoris: Secondary | ICD-10-CM | POA: Diagnosis not present

## 2022-01-28 DIAGNOSIS — I5032 Chronic diastolic (congestive) heart failure: Secondary | ICD-10-CM | POA: Diagnosis not present

## 2022-01-28 DIAGNOSIS — E861 Hypovolemia: Secondary | ICD-10-CM | POA: Diagnosis present

## 2022-01-28 DIAGNOSIS — I10 Essential (primary) hypertension: Secondary | ICD-10-CM | POA: Diagnosis not present

## 2022-01-28 DIAGNOSIS — E039 Hypothyroidism, unspecified: Secondary | ICD-10-CM | POA: Diagnosis present

## 2022-01-28 DIAGNOSIS — Z950 Presence of cardiac pacemaker: Secondary | ICD-10-CM

## 2022-01-28 DIAGNOSIS — I959 Hypotension, unspecified: Principal | ICD-10-CM

## 2022-01-28 DIAGNOSIS — N179 Acute kidney failure, unspecified: Secondary | ICD-10-CM | POA: Diagnosis present

## 2022-01-28 DIAGNOSIS — T45515A Adverse effect of anticoagulants, initial encounter: Secondary | ICD-10-CM | POA: Diagnosis present

## 2022-01-28 DIAGNOSIS — R55 Syncope and collapse: Secondary | ICD-10-CM | POA: Diagnosis not present

## 2022-01-28 DIAGNOSIS — E785 Hyperlipidemia, unspecified: Secondary | ICD-10-CM | POA: Diagnosis present

## 2022-01-28 DIAGNOSIS — Z6825 Body mass index (BMI) 25.0-25.9, adult: Secondary | ICD-10-CM

## 2022-01-28 DIAGNOSIS — Z8673 Personal history of transient ischemic attack (TIA), and cerebral infarction without residual deficits: Secondary | ICD-10-CM | POA: Diagnosis not present

## 2022-01-28 DIAGNOSIS — F410 Panic disorder [episodic paroxysmal anxiety] without agoraphobia: Secondary | ICD-10-CM | POA: Diagnosis present

## 2022-01-28 DIAGNOSIS — E440 Moderate protein-calorie malnutrition: Secondary | ICD-10-CM | POA: Diagnosis present

## 2022-01-28 DIAGNOSIS — I3139 Other pericardial effusion (noninflammatory): Secondary | ICD-10-CM | POA: Diagnosis present

## 2022-01-28 DIAGNOSIS — J9811 Atelectasis: Secondary | ICD-10-CM | POA: Diagnosis present

## 2022-01-28 DIAGNOSIS — E1165 Type 2 diabetes mellitus with hyperglycemia: Secondary | ICD-10-CM | POA: Diagnosis present

## 2022-01-28 DIAGNOSIS — R29818 Other symptoms and signs involving the nervous system: Secondary | ICD-10-CM | POA: Diagnosis not present

## 2022-01-28 DIAGNOSIS — R579 Shock, unspecified: Secondary | ICD-10-CM

## 2022-01-28 DIAGNOSIS — E119 Type 2 diabetes mellitus without complications: Secondary | ICD-10-CM | POA: Diagnosis not present

## 2022-01-28 DIAGNOSIS — K5900 Constipation, unspecified: Secondary | ICD-10-CM | POA: Diagnosis present

## 2022-01-28 DIAGNOSIS — R7401 Elevation of levels of liver transaminase levels: Secondary | ICD-10-CM | POA: Diagnosis present

## 2022-01-28 DIAGNOSIS — Z85828 Personal history of other malignant neoplasm of skin: Secondary | ICD-10-CM

## 2022-01-28 DIAGNOSIS — I495 Sick sinus syndrome: Secondary | ICD-10-CM | POA: Diagnosis present

## 2022-01-28 DIAGNOSIS — K5730 Diverticulosis of large intestine without perforation or abscess without bleeding: Secondary | ICD-10-CM | POA: Diagnosis not present

## 2022-01-28 DIAGNOSIS — E872 Acidosis, unspecified: Secondary | ICD-10-CM | POA: Diagnosis present

## 2022-01-28 DIAGNOSIS — R0902 Hypoxemia: Secondary | ICD-10-CM | POA: Diagnosis not present

## 2022-01-28 DIAGNOSIS — N400 Enlarged prostate without lower urinary tract symptoms: Secondary | ICD-10-CM | POA: Diagnosis present

## 2022-01-28 DIAGNOSIS — R846 Abnormal cytological findings in specimens from respiratory organs and thorax: Secondary | ICD-10-CM | POA: Diagnosis not present

## 2022-01-28 DIAGNOSIS — Y831 Surgical operation with implant of artificial internal device as the cause of abnormal reaction of the patient, or of later complication, without mention of misadventure at the time of the procedure: Secondary | ICD-10-CM | POA: Diagnosis present

## 2022-01-28 DIAGNOSIS — J90 Pleural effusion, not elsewhere classified: Secondary | ICD-10-CM | POA: Diagnosis not present

## 2022-01-28 DIAGNOSIS — I517 Cardiomegaly: Secondary | ICD-10-CM | POA: Diagnosis not present

## 2022-01-28 DIAGNOSIS — R571 Hypovolemic shock: Secondary | ICD-10-CM | POA: Diagnosis present

## 2022-01-28 DIAGNOSIS — I080 Rheumatic disorders of both mitral and aortic valves: Secondary | ICD-10-CM | POA: Diagnosis present

## 2022-01-28 DIAGNOSIS — R200 Anesthesia of skin: Secondary | ICD-10-CM | POA: Diagnosis present

## 2022-01-28 DIAGNOSIS — K4020 Bilateral inguinal hernia, without obstruction or gangrene, not specified as recurrent: Secondary | ICD-10-CM | POA: Diagnosis not present

## 2022-01-28 DIAGNOSIS — Z9989 Dependence on other enabling machines and devices: Secondary | ICD-10-CM

## 2022-01-28 DIAGNOSIS — T829XXD Unspecified complication of cardiac and vascular prosthetic device, implant and graft, subsequent encounter: Secondary | ICD-10-CM | POA: Diagnosis not present

## 2022-01-28 DIAGNOSIS — I672 Cerebral atherosclerosis: Secondary | ICD-10-CM | POA: Diagnosis not present

## 2022-01-28 DIAGNOSIS — T829XXA Unspecified complication of cardiac and vascular prosthetic device, implant and graft, initial encounter: Secondary | ICD-10-CM | POA: Diagnosis present

## 2022-01-28 DIAGNOSIS — N281 Cyst of kidney, acquired: Secondary | ICD-10-CM | POA: Diagnosis not present

## 2022-01-28 DIAGNOSIS — R0602 Shortness of breath: Secondary | ICD-10-CM | POA: Diagnosis not present

## 2022-01-28 DIAGNOSIS — I998 Other disorder of circulatory system: Secondary | ICD-10-CM | POA: Diagnosis not present

## 2022-01-28 DIAGNOSIS — I700 Atherosclerosis of aorta: Secondary | ICD-10-CM | POA: Diagnosis not present

## 2022-01-28 DIAGNOSIS — I5030 Unspecified diastolic (congestive) heart failure: Secondary | ICD-10-CM | POA: Diagnosis not present

## 2022-01-28 DIAGNOSIS — I629 Nontraumatic intracranial hemorrhage, unspecified: Secondary | ICD-10-CM | POA: Diagnosis not present

## 2022-01-28 DIAGNOSIS — G4700 Insomnia, unspecified: Secondary | ICD-10-CM | POA: Diagnosis present

## 2022-01-28 DIAGNOSIS — I351 Nonrheumatic aortic (valve) insufficiency: Secondary | ICD-10-CM | POA: Diagnosis not present

## 2022-01-28 DIAGNOSIS — I4891 Unspecified atrial fibrillation: Secondary | ICD-10-CM | POA: Diagnosis not present

## 2022-01-28 LAB — CUP PACEART INCLINIC DEVICE CHECK
Battery Remaining Longevity: 180 mo
Battery Voltage: 3.22 V
Brady Statistic RA Percent Paced: 0.46 %
Brady Statistic RV Percent Paced: 8.61 %
Date Time Interrogation Session: 20230628112200
Implantable Lead Implant Date: 20230614
Implantable Lead Implant Date: 20230616
Implantable Lead Location: 753859
Implantable Lead Location: 753860
Implantable Lead Model: 5076
Implantable Lead Model: 5076
Implantable Pulse Generator Implant Date: 20230614
Lead Channel Impedance Value: 285 Ohm
Lead Channel Impedance Value: 380 Ohm
Lead Channel Impedance Value: 513 Ohm
Lead Channel Impedance Value: 551 Ohm
Lead Channel Sensing Intrinsic Amplitude: 0.375 mV
Lead Channel Sensing Intrinsic Amplitude: 21.875 mV
Lead Channel Setting Pacing Amplitude: 3.5 V
Lead Channel Setting Pacing Amplitude: 3.5 V
Lead Channel Setting Pacing Pulse Width: 0.4 ms
Lead Channel Setting Sensing Sensitivity: 1.2 mV

## 2022-01-28 LAB — CBC WITH DIFFERENTIAL/PLATELET
Abs Immature Granulocytes: 0.11 10*3/uL — ABNORMAL HIGH (ref 0.00–0.07)
Basophils Absolute: 0.1 10*3/uL (ref 0.0–0.1)
Basophils Relative: 1 %
Eosinophils Absolute: 0.1 10*3/uL (ref 0.0–0.5)
Eosinophils Relative: 1 %
HCT: 44.3 % (ref 39.0–52.0)
Hemoglobin: 14.2 g/dL (ref 13.0–17.0)
Immature Granulocytes: 1 %
Lymphocytes Relative: 32 %
Lymphs Abs: 3.7 10*3/uL (ref 0.7–4.0)
MCH: 30.3 pg (ref 26.0–34.0)
MCHC: 32.1 g/dL (ref 30.0–36.0)
MCV: 94.7 fL (ref 80.0–100.0)
Monocytes Absolute: 0.6 10*3/uL (ref 0.1–1.0)
Monocytes Relative: 5 %
Neutro Abs: 7.3 10*3/uL (ref 1.7–7.7)
Neutrophils Relative %: 60 %
Platelets: 408 10*3/uL — ABNORMAL HIGH (ref 150–400)
RBC: 4.68 MIL/uL (ref 4.22–5.81)
RDW: 14 % (ref 11.5–15.5)
WBC: 11.8 10*3/uL — ABNORMAL HIGH (ref 4.0–10.5)
nRBC: 0 % (ref 0.0–0.2)

## 2022-01-28 LAB — PROCALCITONIN: Procalcitonin: <0.1 ng/mL

## 2022-01-28 LAB — COMPREHENSIVE METABOLIC PANEL
ALT: 12 U/L (ref 0–44)
AST: 34 U/L (ref 15–41)
Albumin: 3.5 g/dL (ref 3.5–5.0)
Alkaline Phosphatase: 52 U/L (ref 38–126)
Anion gap: 16 — ABNORMAL HIGH (ref 5–15)
BUN: 22 mg/dL (ref 8–23)
CO2: 15 mmol/L — ABNORMAL LOW (ref 22–32)
Calcium: 9.2 mg/dL (ref 8.9–10.3)
Chloride: 106 mmol/L (ref 98–111)
Creatinine, Ser: 1.51 mg/dL — ABNORMAL HIGH (ref 0.61–1.24)
GFR, Estimated: 46 mL/min — ABNORMAL LOW (ref >60)
Glucose, Bld: 303 mg/dL — ABNORMAL HIGH (ref 70–99)
Potassium: 5.8 mmol/L — ABNORMAL HIGH (ref 3.5–5.1)
Sodium: 137 mmol/L (ref 135–145)
Total Bilirubin: 1.5 mg/dL — ABNORMAL HIGH (ref 0.3–1.2)
Total Protein: 6.4 g/dL — ABNORMAL LOW (ref 6.5–8.1)

## 2022-01-28 LAB — I-STAT CHEM 8, ED
BUN: 24 mg/dL — ABNORMAL HIGH (ref 8–23)
Calcium, Ion: 1.04 mmol/L — ABNORMAL LOW (ref 1.15–1.40)
Chloride: 107 mmol/L (ref 98–111)
Creatinine, Ser: 1.4 mg/dL — ABNORMAL HIGH (ref 0.61–1.24)
Glucose, Bld: 285 mg/dL — ABNORMAL HIGH (ref 70–99)
HCT: 41 % (ref 39.0–52.0)
Hemoglobin: 13.9 g/dL (ref 13.0–17.0)
Potassium: 4.9 mmol/L (ref 3.5–5.1)
Sodium: 135 mmol/L (ref 135–145)
TCO2: 16 mmol/L — ABNORMAL LOW (ref 22–32)

## 2022-01-28 LAB — CBG MONITORING, ED: Glucose-Capillary: 297 mg/dL — ABNORMAL HIGH (ref 70–99)

## 2022-01-28 LAB — LACTIC ACID, PLASMA
Lactic Acid, Venous: 8.5 mmol/L (ref 0.5–1.9)
Lactic Acid, Venous: 5.8 mmol/L (ref 0.5–1.9)

## 2022-01-28 LAB — BRAIN NATRIURETIC PEPTIDE: B Natriuretic Peptide: 377.5 pg/mL — ABNORMAL HIGH (ref 0.0–100.0)

## 2022-01-28 LAB — TROPONIN I (HIGH SENSITIVITY)
Troponin I (High Sensitivity): 14 ng/L (ref <18)
Troponin I (High Sensitivity): 13 ng/L (ref <18)

## 2022-01-28 MED ORDER — IOHEXOL 350 MG/ML SOLN
200.0000 mL | Freq: Once | INTRAVENOUS | Status: AC | PRN
  Administered 2022-01-28: 200 mL via INTRAVENOUS

## 2022-01-28 MED ORDER — FUROSEMIDE 20 MG PO TABS: ORAL_TABLET | ORAL | 0 refills | Status: DC

## 2022-01-28 MED ORDER — LACTATED RINGERS IV BOLUS: 1000.0000 mL | Freq: Once | INTRAVENOUS | Status: DC

## 2022-01-28 MED ORDER — EPINEPHRINE HCL 5 MG/250ML IV SOLN IN NS
INTRAVENOUS | Status: AC
  Administered 2022-01-28: 5 mg
  Filled 2022-01-28: qty 250

## 2022-01-28 MED ORDER — METOPROLOL TARTRATE 25 MG PO TABS: 25.0000 mg | ORAL_TABLET | Freq: Two times a day (BID) | ORAL | 3 refills | Status: DC

## 2022-01-28 MED ORDER — SODIUM CHLORIDE 0.9 % IV BOLUS
1000.0000 mL | Freq: Once | INTRAVENOUS | Status: AC
  Administered 2022-01-28: 1000 mL via INTRAVENOUS

## 2022-01-28 MED ORDER — NOREPINEPHRINE 4 MG/250ML-% IV SOLN
2.0000 ug/min | INTRAVENOUS | Status: DC
  Filled 2022-01-28: qty 250

## 2022-01-28 MED ORDER — SODIUM CHLORIDE 0.9 % IV SOLN
250.0000 mL | INTRAVENOUS | Status: DC
  Administered 2022-01-28: 250 mL via INTRAVENOUS

## 2022-01-28 NOTE — ED Notes (Signed)
To c-t  iv nss infused 2nd liter here placed to run wide open

## 2022-01-28 NOTE — Patient Instructions (Addendum)
Medication Instructions:  Your physician has recommended you make the following change in your medication:   ** Begin Furosemide '20mg'$  - 1 tablet by mouth today and then daily for the next 3 days.  Metoprolol Tartrate '25mg'$  - 1 tablet by mouth twice daily refilled  *If you need a refill on your cardiac medications before your next appointment, please call your pharmacy*   Lab Work: None ordered.  If you have labs (blood work) drawn today and your tests are completely normal, you will receive your results only by: [place] (if you have MyChart) OR A paper copy in the mail If you have any lab test that is abnormal or we need to change your treatment, we will call you to review the results.   Testing/Procedures: None ordered.    Follow-Up: At [facility], you and your health needs are our priority.  As part of our continuing mission to provide you with exceptional heart care, we have created designated Provider Care Teams.  These Care Teams include your primary Cardiologist (physician) and Advanced Practice Providers (APPs -  Physician Assistants and Nurse Practitioners) who all work together to provide you with the care you need, when you need it.  We recommend signing up for the patient portal called "MyChart".  Sign up information is provided on this After Visit Summary.  MyChart is used to connect with patients for Virtual Visits (Telemedicine).  Patients are able to view lab/test results, encounter notes, upcoming appointments, etc.  Non-urgent messages can be sent to your provider as well.   To learn more about what you can do with MyChart, go to [email or web address].    Your next appointment:   As scheduled:1}    Other Instructions Please send a device transmission on Monday  Important Information About Sugar

## 2022-01-28 NOTE — ED Notes (Signed)
Lt foot and ankle colder than the rt leg  the pt reports that he feels colder from his knee down also   minimal pulse  felt by the edp

## 2022-01-28 NOTE — ED Notes (Signed)
Family  talking to edp

## 2022-01-28 NOTE — ED Notes (Signed)
The pts color is better and he is more alert  hes a little hard of hearing  he has hearing aids in place

## 2022-01-28 NOTE — ED Provider Notes (Signed)
[place] EMERGENCY DEPARTMENT Provider Note   CSN: [id number] Arrival date & time: 01/28/22  1843     History  Chief Complaint  Patient presents with   Hypotension    Blakely Gluth is a 81 y.o. male.  HPI  81 year old male with medical history significant for DM 2, HTN, CVA, atrial fibrillation, pacemaker implant with lead revision/repair on 01/16/2022 who presents to the emergency department with shock.  The patient was seen in his cardiology clinic earlier today.  He had had some shortness of breath overnight.  His pacemaker was interrogated and he was found to be in episodes of atrial fibrillation in the last day.  He was restarted on 25 mg of metoprolol and took that medication.  He suddenly started feeling weak after he took the medication and was clammy and pale upon EMS arrival.  The patient received 200 cc of normal saline with EMS.  Initially blood pressures improved to 106/60 but upon arrival to the emergency department, the patient was notably in shock with systolic blood pressures in the 70s.  IV access was obtained emergently and the patient was administered IV fluids.  He denied chest pain.  He endorsed numbness in his left leg.  He endorsed persistent shortness of breath.  Home Medications Prior to Admission medications   Medication Sig Start Date End Date Taking? Authorizing Provider  Accu-Chek FastClix Lancets MISC Use to check blood sugar once daily. E11.9 02/26/21   Elsie Stain, MD  acetaminophen (TYLENOL) 500 MG tablet Take 1,000 mg by mouth daily as needed for moderate pain.    [provider]  apixaban (ELIQUIS) 5 MG TABS tablet Take 5 mg by mouth 2 (two) times daily. 07/13/19   [provider]  Blood Glucose Monitoring Suppl (ACCU-CHEK GUIDE ME) w/Device KIT USE AS DIRECTED 09/19/21   Elsie Stain, MD  Calcium Carbonate-Vitamin D (CALCIUM 600+D PO) Take 2 tablets by mouth daily.    [provider]   cholecalciferol (VITAMIN D3) 25 MCG (1000 UNIT) tablet Take 1,000 Units by mouth daily.    [provider]  enalapril (VASOTEC) 5 MG tablet Take 5 mg by mouth daily. 09/28/18   [provider]  finasteride (PROSCAR) 5 MG tablet TAKE 1 TABLET EVERY DAY 09/19/21   Elsie Stain, MD  furosemide (LASIX) 20 MG tablet Take 1 tablet by mouth today and then daily for the next 3 days then stop. 01/28/22   Deboraha Sprang, MD  glucose blood (ACCU-CHEK GUIDE) test strip USE TO CHECK BLOOD SUGAR ONCE DAILY. 09/19/21   Elsie Stain, MD  levothyroxine (SYNTHROID) 25 MCG tablet Take 25 mcg by mouth daily before breakfast. 12/03/18   [provider]  magnesium oxide (MAG-OX) 400 MG tablet Take 400 mg by mouth daily. 12/13/18   [provider]  metFORMIN (GLUCOPHAGE-XR) 500 MG 24 hr tablet Take 2 tablets (1,000 mg total) by mouth 2 (two) times daily. 09/19/21   Elsie Stain, MD  metoprolol tartrate (LOPRESSOR) 25 MG tablet Take 1 tablet (25 mg total) by mouth 2 (two) times daily. 01/28/22   Deboraha Sprang, MD  simvastatin (ZOCOR) 10 MG tablet Take 10 mg by mouth daily. 09/28/18   [provider]  verapamil (CALAN-SR) 240 MG CR tablet Take 1 tablet (240 mg total) by mouth daily. 11/21/21   Deboraha Sprang, MD      Allergies    Patient has no known allergies.    Review of  Systems   Review of Systems  Unable to perform ROS: Acuity of condition    Physical Exam Updated Vital Signs BP (!) 113/50   Pulse (!) 59   Temp (!) 97.2 F (36.2 C)   Resp (!) 29   SpO2 96%  Physical Exam Vitals and nursing note reviewed.  Constitutional:      General: He is in acute distress.     Appearance: He is ill-appearing and diaphoretic.  HENT:     Head: Normocephalic and atraumatic.  Eyes:     Conjunctiva/sclera: Conjunctivae normal.     Pupils: Pupils are equal, round, and reactive to light.  Neck:     Vascular: No JVD.  Cardiovascular:     Rate and Rhythm: Regular  rhythm. Bradycardia present.     Comments: Pulses weak but palpable, difficult to palpate pulses in the LLE Pulmonary:     Effort: Pulmonary effort is normal. No respiratory distress.     Comments: Clear lung sounds bilaterally Abdominal:     General: There is no distension.     Tenderness: There is no guarding.  Musculoskeletal:        General: No deformity or signs of injury.     Cervical back: Neck supple.     Right lower leg: No edema.     Left lower leg: No edema.  Skin:    Capillary Refill: Capillary refill takes more than 3 seconds.     Coloration: Skin is pale.     Findings: No lesion or rash.     Comments: Cool and pale/clammy extremities  Neurological:     Mental Status: He is disoriented.     Sensory: Sensory deficit present.     Comments: No clear cranial nerve deficit, moving all four extremities, sensory deficit to the LLE     ED Results / Procedures / Treatments   Labs (all labs ordered are listed, but only abnormal results are displayed) Labs Reviewed  CBC WITH DIFFERENTIAL/PLATELET - Abnormal; Notable for the following components:      Result Value   WBC 11.8 (*)    Platelets 408 (*)    Abs Immature Granulocytes 0.11 (*)    All other components within normal limits  COMPREHENSIVE METABOLIC PANEL - Abnormal; Notable for the following components:   Potassium 5.8 (*)    CO2 15 (*)    Glucose, Bld 303 (*)    Creatinine, Ser 1.51 (*)    Total Protein 6.4 (*)    Total Bilirubin 1.5 (*)    GFR, Estimated 46 (*)    Anion gap 16 (*)    All other components within normal limits  LACTIC ACID, PLASMA - Abnormal; Notable for the following components:   Lactic Acid, Venous 8.5 (*)    All other components within normal limits  BRAIN NATRIURETIC PEPTIDE - Abnormal; Notable for the following components:   B Natriuretic Peptide 377.5 (*)    All other components within normal limits  CBG MONITORING, ED - Abnormal; Notable for the following components:    Glucose-Capillary 297 (*)    All other components within normal limits  I-STAT CHEM 8, ED - Abnormal; Notable for the following components:   BUN 24 (*)    Creatinine, Ser 1.40 (*)    Glucose, Bld 285 (*)    Calcium, Ion 1.04 (*)    TCO2 16 (*)    All other components within normal limits  PROCALCITONIN  LACTIC ACID, PLASMA  TROPONIN I (HIGH SENSITIVITY)  TROPONIN I (HIGH SENSITIVITY)    EKG EKG Interpretation  Date/Time:  Wednesday January 28 2022 19:03:38 EDT Ventricular Rate:  60 PR Interval:  160 QRS Duration: 167 QT Interval:  557 QTC Calculation: 557 R Axis:   258 Text Interpretation: Paced rhythm Right bundle branch block Inferior infarct, old Abnormal lateral Q waves Baseline wander in lead(s) I aVR V1 No STEMI Confirmed by Regan Lemming ([id number]) on 01/28/2022 10:32:42 PM  Radiology CT Angio Chest/Abd/Pel for Dissection W and/or Wo Contrast  Result Date: 01/28/2022 CLINICAL DATA:  Acute aortic syndrome (AAS) suspected. Shortness of breath. EXAM: CT ANGIOGRAPHY CHEST, ABDOMEN AND PELVIS TECHNIQUE: Non-contrast CT of the chest was initially obtained. Multidetector CT imaging through the chest, abdomen and pelvis was performed using the standard protocol during bolus administration of intravenous contrast. Multiplanar reconstructed images and MIPs were obtained and reviewed to evaluate the vascular anatomy. RADIATION DOSE REDUCTION: This exam was performed according to the departmental dose-optimization program which includes automated exposure control, adjustment of the mA and/or kV according to patient size and/or use of iterative reconstruction technique. CONTRAST:  270m OMNIPAQUE IOHEXOL 350 MG/ML SOLN COMPARISON:  01/16/2022 FINDINGS: CTA CHEST FINDINGS Cardiovascular: Heart is normal size. Small to moderate pericardial effusion, stable since prior study. Extensive coronary artery calcifications. Moderate aortic calcifications. No evidence of aortic aneurysm or dissection.  Mediastinum/Nodes: No mediastinal, hilar, or axillary adenopathy. Trachea and esophagus are unremarkable. Thyroid unremarkable. Lungs/Pleura: Small bilateral pleural effusions, new since prior study. Bibasilar atelectasis. Musculoskeletal: Left chest wall pacer noted. Chest wall soft tissues are unremarkable. No acute bony abnormality. Review of the MIP images confirms the above findings. CTA ABDOMEN AND PELVIS FINDINGS VASCULAR Aorta: Diffuse aortic atherosclerosis. No evidence of aortic aneurysm or dissection. Celiac: Widely patent. SMA: Widely patent Renals: Single bilaterally, widely patent IMA: Widely patent Inflow: Atherosclerotic calcifications diffusely. No aneurysm or dissection. Veins: No obvious venous abnormality within the limitations of this arterial phase study. Review of the MIP images confirms the above findings. NON-VASCULAR Hepatobiliary: Prior cholecystectomy.  No focal hepatic abnormality. Pancreas: No focal abnormality or ductal dilatation. There is mild stranding seen adjacent to the pancreatic head and in the adjacent porta hepatis and adjacent to the descending duodenum. Also mild stranding adjacent to the pancreatic tail. This could reflect early acute pancreatitis. Recommend clinical correlation. Spleen: No focal abnormality.  Normal size. Adrenals/Urinary Tract: Adrenal glands unremarkable. Bilateral small renal cysts, appear benign. No follow-up imaging recommended. No hydronephrosis. Urinary bladder decompressed. Stomach/Bowel: Left colonic diverticulosis. No active diverticulitis. Stomach and small bowel decompressed, grossly unremarkable. Lymphatic: No adenopathy Reproductive: No visible focal abnormality. Other: No free fluid or free air. Bilateral inguinal hernias containing fat, left larger than right. Musculoskeletal: No acute bony abnormality. Review of the MIP images confirms the above findings. IMPRESSION: Aortic atherosclerosis. No evidence of aortic aneurysm or dissection.  Small bilateral pleural effusions.  Bibasilar atelectasis. Mild stranding/edema noted adjacent to the pancreatic head and pancreatic tail raising the possibility of early acute pancreatitis. Recommend clinical correlation. Left colonic diverticulosis. Electronically Signed   By: KRolm BaptiseM.D.   On: 01/28/2022 20:31   DG Chest Port 1 View  Result Date: 01/28/2022 CLINICAL DATA:  Presyncope EXAM: PORTABLE CHEST 1 VIEW COMPARISON:  None Available. FINDINGS: Mild bibasilar atelectasis or infiltrate. Small right pleural effusion is present. No pneumothorax. Cardiomegaly is stable. 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 Pulmonary vascularity is normal. No acute bone abnormality. IMPRESSION: 1. Mild bibasilar atelectasis or infiltrate. Small right pleural effusion. 2. Stable  cardiomegaly. Electronically Signed   By: Fidela Salisbury M.D.   On: 01/28/2022 19:47   CUP PACEART INCLINIC DEVICE CHECK  Result Date: 01/28/2022 Wound check appointment. Steri-strips removed. Wound without redness or edema. Incision edges approximated, wound well healed. Normal device function. RA/RV sensing, and impedances consistent with implant measurements. Unable to run thresholds secondary to RVR. Burden 99.8% with current RVR episode ongoing from 1 day, 10 hours ago. Patient symptomatic complaining of inability to take a deep breath. +OAC. Dr. Caryl Comes in to assess patient and patient added to Dr. Aquilla Hacker schedule today. Device programmed at 3.5V/auto capture programmed on for extra safety margin until 3 month visit. Histogram distribution appropriate for patient and level of activity.   Procedures .Critical Care  Performed by: Regan Lemming, MD Authorized by: Regan Lemming, MD   Critical care provider statement:    Critical care time (minutes):  120   Critical care was necessary to treat or prevent imminent or life-threatening deterioration of the following conditions:  Circulatory failure and shock   Critical  care was time spent personally by me on the following activities:  Development of treatment plan with patient or surrogate, discussions with consultants, evaluation of patient's response to treatment, examination of patient, ordering and review of laboratory studies, ordering and review of radiographic studies, ordering and performing treatments and interventions, pulse oximetry, re-evaluation of patient's condition and review of old charts   Care discussed with: admitting provider   Ultrasound ED Echo  Date/Time: 01/28/2022 7:15 PM  Performed by: Regan Lemming, MD Authorized by: Regan Lemming, MD   Procedure details:    Indications: hypotension     Views: subxiphoid and IVC view     Images: not archived     Limitations:  Body habitus, increased thoracic air and acoustic shadowing Findings:    Pericardium: small pericardial effusion     IVC: collapsed   Impression:    Impression: pericardial effusion present and probable low CVP   Ultrasound ED Peripheral IV (Provider)  Date/Time: 01/28/2022 7:15 PM  Performed by: Regan Lemming, MD Authorized by: Regan Lemming, MD   Procedure details:    Indications: hypotension and poor IV access     Location:  Left AC   Angiocath:  20 G   Bedside Ultrasound Guided: Yes     Images: not archived     Patient tolerated procedure without complications: Yes     Dressing applied: Yes       Medications Ordered in ED Medications  0.9 %  sodium chloride infusion (250 mLs Intravenous Not Given 01/28/22 2124)  sodium chloride 0.9 % bolus 1,000 mL (has no administration in time range)  EPINEPHrine NaCl 5-0.9 MG/250ML-% premix infusion (0 mcg/min  Stopped 01/28/22 2030)  iohexol (OMNIPAQUE) 350 MG/ML injection 200 mL (200 mLs Intravenous Contrast Given 01/28/22 2021)    ED Course/ Medical Decision Making/ A&P                           Medical Decision Making Amount and/or Complexity of Data Reviewed Labs: ordered. Radiology: ordered. ECG/medicine  tests: ordered.  Risk Prescription drug management. Decision regarding hospitalization.   81 year old male with medical history significant for DM 2, HTN, CVA, atrial fibrillation, pacemaker implant with lead revision/repair on 01/16/2022 who presents to the emergency department with shock.  The patient was seen in his cardiology clinic earlier today.  He had had some shortness of breath overnight.  His pacemaker was interrogated and he  was found to be in episodes of atrial fibrillation in the last day.  He was restarted on 25 mg of metoprolol and took that medication.  He suddenly started feeling weak after he took the medication and was clammy and pale upon EMS arrival.  The patient received 200 cc of normal saline with EMS.  Initially blood pressures improved to 106/60 but upon arrival to the emergency department, the patient was notably in shock with systolic blood pressures in the 70s.  IV access was obtained emergently and the patient was administered IV fluids.  He denied chest pain.  He endorsed numbness in his left leg.  He endorsed persistent shortness of breath.  On arrival, the patient was in cold shock with cool clammy extremities, delayed capillary refill, decreased mental status, hypotension with systolic blood pressures in the 60s and 70s.  The patient was administered 2 L of IV fluids on arrival.  A bedside ultrasound was performed and revealed a small pericardial effusion, no clear evidence of tamponade although limited by patient body habitus and bowel gas. Ultrasound-guided IV access was obtained and the patient underwent CTA imaging to evaluate for dissection.   Concern for hypovolemic shock, cardiac tamponade, aortic dissection, ACS, PE.  Initial EKG revealed no evidence of STEMI, paced rhythm with a ventricular rate of 6 0.  The patient's pacemaker was interrogated and per [employer] pacer rep, no abnormalities have been noted since his pacemaker was last interrogated in clinic this  afternoon.  CT imaging was performed and resulted negative for acute aortic dissection: IMPRESSION:  Aortic atherosclerosis. No evidence of aortic aneurysm or  dissection.    Small bilateral pleural effusions.  Bibasilar atelectasis.    Mild stranding/edema noted adjacent to the pancreatic head and  pancreatic tail raising the possibility of early acute pancreatitis.  Recommend clinical correlation.    Left colonic diverticulosis.   No evidence for PE although the patient's contrast bolus timing was optimized for analysis of the aorta.  No clear evidence of tamponade on bedside ultrasound imaging.  Formal echocardiogram ordered.  Discussed the case with on-call cardiology, Dr. Harl Bowie.  The patient's symptoms started after taking a single dose of 25 mg of metoprolol earlier today.  He appears to have suffered hemodynamic collapse and shock, likely component of hypovolemic and cardiogenic. An initial lactic acid was elevated to >8. The patient was briefly started on 77mg/min of Epinephrine which was stopped. On repeat assessment following a total of 2.2L IVF, the patient remained hemodynamically stable. Pulses intact on reassessment. Low concern for acute limb ischemia. Neurologically intact. Unfortunately, CT imaging of the head limited by previous contrast administration. With no evidence of aortic dissection on CTA, concern for TIA in the setting of hypotension as patient numbness in his extremity has resolved. MRI brain ordered but will need to be done inpatient as the patient has a pacemaker in place. Vas [place]Is ordered as well given poor peripheral pulses and initial sensory deficit. On repeat assessment, patient with intact pulses bilaterally noted with intact 1+ PT pulses. Pt states that this medication (Metoprolol) caused a similar response in the past. Unclear if patient experienced anaphylactic reaction, exacerbation of aortic stenosis, hypovolemia, PE. No clear infectious etiology.  Discussed presentation and plan of care with family bedside. The patient appears to be back to baseline and now hemodynamically stable. Stable for admission to the floor. Dr. HVelia Meyerof hospitalist medicine consulted for admission and accepted the patient in admission.  Final Clinical Impression(s) / ED Diagnoses  Final diagnoses:  Hypotension, unspecified hypotension type  Shock ([place])  Lactic acidosis    Rx / DC Orders ED Discharge Orders     None         Regan Lemming, MD 01/29/22 [phone]

## 2022-01-28 NOTE — ED Notes (Signed)
Epi drip started at 10  stopped at   2030 per edp

## 2022-01-28 NOTE — Progress Notes (Signed)
Wound check appointment. Steri-strips removed. Wound without redness or edema. Incision edges approximated, wound well healed. Normal device function. RA/RV sensing, and impedances consistent with implant measurements. Unable to run thresholds secondary to RVR. Burden 99.8% with current RVR episode ongoing from 1 day, 10 hours ago. Patient symptomatic complaining of inability to take a deep breath. +OAC. Dr. Caryl Comes in to assess patient and patient added to Dr. Aquilla Hacker schedule today. Device programmed at 3.5V/auto capture programmed on for extra safety margin until 3 month visit. Histogram distribution appropriate for patient and level of activity.

## 2022-01-28 NOTE — Progress Notes (Signed)
Patient Care Team: Elsie Stain, MD as PCP - General (Pulmonary Disease) Serita Kyle, MD as PCP - Cardiology (Cardiology) Deboraha Sprang, MD as PCP - Electrophysiology (Cardiology)   HPI  Edward Butler is a 81 y.o. male seen following pacemaker implantation 2 weeks ago complicated by microperforation of the presumably atrial lead with repositioning of both leads done by Dr. Elliot Cousin in my absence.  He has a history of paroxysmal and perhaps persistent atrial fibrillation with rapid ventricular rates with rate control complicated by bradycardia prompting pacemaker implantation comes  He comes in today with complaints of orthopnea and nocturnal dyspnea at the time of his wound check.  No significant edema.    DATE TEST EF    8/22 Echo   55-60 %  AS moderate-severe  3/23 Echo    65-70 % Ao Mean Grad 36 LVH-asymmetric 15/8 mm             Date Cr K Hgb  3/23 1.01 3.8 13.7   6/23  0.92 4.4 14.8    Records and Results Reviewed   Past Medical History:  Diagnosis Date   Aneurysm ([place])    Atrial fibrillation ([place])    CAP (community acquired pneumonia) 10/20/2021   Diabetes mellitus without complication ([place])    Hypertension    Non-recurrent acute suppurative otitis media of right ear without spontaneous rupture of tympanic membrane 07/04/2021   Stroke [facility])     Past Surgical History:  Procedure Laterality Date   LEAD REVISION/REPAIR N/A 01/16/2022   Procedure: LEAD REVISION/REPAIR;  Surgeon: Evans Lance, MD;  Location: [place] [facility];  Service: Cardiovascular;  Laterality: N/A;   PACEMAKER IMPLANT N/A 01/14/2022   Procedure: PACEMAKER IMPLANT;  Surgeon: Deboraha Sprang, MD;  Location: [place] [facility];  Service: Cardiovascular;  Laterality: N/A;   skin cancer surgeries       Current Meds  Medication Sig   Accu-Chek FastClix Lancets MISC Use to check blood sugar once daily. E11.9   acetaminophen (TYLENOL) 500 MG tablet Take 1,000 mg by mouth daily as needed  for moderate pain.   apixaban (ELIQUIS) 5 MG TABS tablet Take 5 mg by mouth 2 (two) times daily.   Blood Glucose Monitoring Suppl (ACCU-CHEK GUIDE ME) w/Device KIT USE AS DIRECTED   Calcium Carbonate-Vitamin D (CALCIUM 600+D PO) Take 2 tablets by mouth daily.   cholecalciferol (VITAMIN D3) 25 MCG (1000 UNIT) tablet Take 1,000 Units by mouth daily.   enalapril (VASOTEC) 5 MG tablet Take 5 mg by mouth daily.   finasteride (PROSCAR) 5 MG tablet TAKE 1 TABLET EVERY DAY   glucose blood (ACCU-CHEK GUIDE) test strip USE TO CHECK BLOOD SUGAR ONCE DAILY.   levothyroxine (SYNTHROID) 25 MCG tablet Take 25 mcg by mouth daily before breakfast.   magnesium oxide (MAG-OX) 400 MG tablet Take 400 mg by mouth daily.   metFORMIN (GLUCOPHAGE-XR) 500 MG 24 hr tablet Take 2 tablets (1,000 mg total) by mouth 2 (two) times daily.   simvastatin (ZOCOR) 10 MG tablet Take 10 mg by mouth daily.   verapamil (CALAN-SR) 240 MG CR tablet Take 1 tablet (240 mg total) by mouth daily.    No Known Allergies    Review of Systems negative except from HPI and PMH  Physical Exam BP 112/80   Pulse (!) 132   Ht 5' 6.4" (1.687 m)   SpO2 96%   BMI 25.51 kg/m  Well developed and well nourished in no  acute distress HENT normal E scleral and icterus clear Neck Supple JVP 8-10 carotids brisk and full Clear to ausculation Fast and irregular rate and rhythm, no murmurs gallops or rub Soft with active bowel sounds No clubbing cyanosis tr Edema Alert and oriented, grossly normal motor and sensory function Skin Warm and Dry  ECG atrial fibrillation with a rapid rate at 132 Intervals-/13/33 Axis 90 Right bundle branch block  CrCl cannot be calculated (Unknown ideal weight.).   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  We will begin him on a diuretic furosemide 20 mg daily to take today.  We will also work on starting to slow his heart rate down by adding back the metoprolol 25 mg twice daily which have been used previously.  We will have him transmit next week.  In the event that he remains rapid, I would have a very low threshold for using amiodarone as adjunctive rate control and then rhythm control.  It appears that this episode of atrial fibrillation is about 26 hours old suggesting that there is some spontaneous reversion to sinus rhythm.  There has been insignificant under sensing however some of this is not entirely clear.  He is to call us in the morning if he has worsening symptoms    Current medicines are reviewed at length with the patient today .  The patient does not  have concerns regarding medicines.

## 2022-01-28 NOTE — ED Notes (Signed)
Pt placed on  2 liters of nasal 02 1950

## 2022-01-28 NOTE — ED Triage Notes (Signed)
From home. Pt has  pacemaker. Pt was seen at cardiologist for SOB around 1330. Was sent home w '25mg'$  metoprolol and '20mg'$  lasix, took around 1600 started feeling weak. Pt was clammy and pale upon EMS arrival.Was not able to find radial or BP initially. Bp went up to 106/60 after 250m of NS. A&ox4.

## 2022-01-28 NOTE — Patient Instructions (Addendum)
   After Your Pacemaker   Monitor your pacemaker site for redness, swelling, and drainage. Call the device clinic at [phone] if you experience these symptoms or fever/chills.  Your incision was closed with Steri-strips or staples:  You may shower 7 days after your procedure and wash your incision with soap and water. Avoid lotions, ointments, or perfumes over your incision until it is well-healed.  You may use a hot tub or a pool after your wound check appointment if the incision is completely closed.  Do not lift, push or pull greater than 10 pounds with the affected arm until 6 weeks after your procedure. There are no other restrictions in arm movement after your wound check appointment. July 28  You may drive, unless driving has been restricted by your healthcare providers.  Your Pacemaker is MRI compatible.  Remote monitoring is used to monitor your pacemaker from home. This monitoring is scheduled every 91 days by our office. It allows [place] to keep an eye on the functioning of your device to ensure it is working properly. You will routinely see your Electrophysiologist annually (more often if necessary).

## 2022-01-28 NOTE — ED Notes (Signed)
To c-t for a brain scan

## 2022-01-28 NOTE — ED Notes (Signed)
2nd liter nss stopped

## 2022-01-29 ENCOUNTER — Encounter (HOSPITAL_COMMUNITY): Payer: Self-pay | Admitting: Internal Medicine

## 2022-01-29 ENCOUNTER — Inpatient Hospital Stay (HOSPITAL_COMMUNITY): Payer: Medicare HMO

## 2022-01-29 ENCOUNTER — Encounter (HOSPITAL_COMMUNITY): Payer: Medicare HMO

## 2022-01-29 DIAGNOSIS — R29818 Other symptoms and signs involving the nervous system: Secondary | ICD-10-CM | POA: Diagnosis not present

## 2022-01-29 DIAGNOSIS — I314 Cardiac tamponade: Secondary | ICD-10-CM | POA: Diagnosis present

## 2022-01-29 DIAGNOSIS — G4700 Insomnia, unspecified: Secondary | ICD-10-CM | POA: Diagnosis present

## 2022-01-29 DIAGNOSIS — I3139 Other pericardial effusion (noninflammatory): Secondary | ICD-10-CM | POA: Diagnosis present

## 2022-01-29 DIAGNOSIS — T829XXD Unspecified complication of cardiac and vascular prosthetic device, implant and graft, subsequent encounter: Secondary | ICD-10-CM | POA: Diagnosis not present

## 2022-01-29 DIAGNOSIS — I998 Other disorder of circulatory system: Secondary | ICD-10-CM | POA: Diagnosis not present

## 2022-01-29 DIAGNOSIS — I10 Essential (primary) hypertension: Secondary | ICD-10-CM | POA: Diagnosis not present

## 2022-01-29 DIAGNOSIS — R571 Hypovolemic shock: Secondary | ICD-10-CM | POA: Diagnosis present

## 2022-01-29 DIAGNOSIS — I351 Nonrheumatic aortic (valve) insufficiency: Secondary | ICD-10-CM | POA: Diagnosis not present

## 2022-01-29 DIAGNOSIS — I4891 Unspecified atrial fibrillation: Secondary | ICD-10-CM | POA: Diagnosis not present

## 2022-01-29 DIAGNOSIS — Z6825 Body mass index (BMI) 25.0-25.9, adult: Secondary | ICD-10-CM | POA: Diagnosis not present

## 2022-01-29 DIAGNOSIS — J9811 Atelectasis: Secondary | ICD-10-CM | POA: Diagnosis present

## 2022-01-29 DIAGNOSIS — N179 Acute kidney failure, unspecified: Secondary | ICD-10-CM | POA: Diagnosis present

## 2022-01-29 DIAGNOSIS — E785 Hyperlipidemia, unspecified: Secondary | ICD-10-CM | POA: Diagnosis present

## 2022-01-29 DIAGNOSIS — E039 Hypothyroidism, unspecified: Secondary | ICD-10-CM | POA: Diagnosis present

## 2022-01-29 DIAGNOSIS — I110 Hypertensive heart disease with heart failure: Secondary | ICD-10-CM | POA: Diagnosis present

## 2022-01-29 DIAGNOSIS — I5033 Acute on chronic diastolic (congestive) heart failure: Secondary | ICD-10-CM | POA: Diagnosis not present

## 2022-01-29 DIAGNOSIS — I5032 Chronic diastolic (congestive) heart failure: Secondary | ICD-10-CM | POA: Diagnosis not present

## 2022-01-29 DIAGNOSIS — I080 Rheumatic disorders of both mitral and aortic valves: Secondary | ICD-10-CM | POA: Diagnosis present

## 2022-01-29 DIAGNOSIS — Z8673 Personal history of transient ischemic attack (TIA), and cerebral infarction without residual deficits: Secondary | ICD-10-CM | POA: Diagnosis not present

## 2022-01-29 DIAGNOSIS — E872 Acidosis, unspecified: Secondary | ICD-10-CM | POA: Diagnosis present

## 2022-01-29 DIAGNOSIS — K5900 Constipation, unspecified: Secondary | ICD-10-CM | POA: Diagnosis present

## 2022-01-29 DIAGNOSIS — R079 Chest pain, unspecified: Secondary | ICD-10-CM | POA: Diagnosis not present

## 2022-01-29 DIAGNOSIS — I350 Nonrheumatic aortic (valve) stenosis: Secondary | ICD-10-CM

## 2022-01-29 DIAGNOSIS — Y831 Surgical operation with implant of artificial internal device as the cause of abnormal reaction of the patient, or of later complication, without mention of misadventure at the time of the procedure: Secondary | ICD-10-CM | POA: Diagnosis present

## 2022-01-29 DIAGNOSIS — E1165 Type 2 diabetes mellitus with hyperglycemia: Secondary | ICD-10-CM | POA: Diagnosis present

## 2022-01-29 DIAGNOSIS — R846 Abnormal cytological findings in specimens from respiratory organs and thorax: Secondary | ICD-10-CM | POA: Diagnosis not present

## 2022-01-29 DIAGNOSIS — E861 Hypovolemia: Secondary | ICD-10-CM | POA: Diagnosis present

## 2022-01-29 DIAGNOSIS — I480 Paroxysmal atrial fibrillation: Secondary | ICD-10-CM | POA: Diagnosis present

## 2022-01-29 DIAGNOSIS — T45515A Adverse effect of anticoagulants, initial encounter: Secondary | ICD-10-CM | POA: Diagnosis present

## 2022-01-29 DIAGNOSIS — D6832 Hemorrhagic disorder due to extrinsic circulating anticoagulants: Secondary | ICD-10-CM | POA: Diagnosis present

## 2022-01-29 DIAGNOSIS — I700 Atherosclerosis of aorta: Secondary | ICD-10-CM | POA: Diagnosis not present

## 2022-01-29 DIAGNOSIS — I517 Cardiomegaly: Secondary | ICD-10-CM | POA: Diagnosis not present

## 2022-01-29 DIAGNOSIS — G4733 Obstructive sleep apnea (adult) (pediatric): Secondary | ICD-10-CM | POA: Diagnosis present

## 2022-01-29 DIAGNOSIS — R200 Anesthesia of skin: Secondary | ICD-10-CM | POA: Diagnosis present

## 2022-01-29 DIAGNOSIS — T82198A Other mechanical complication of other cardiac electronic device, initial encounter: Secondary | ICD-10-CM | POA: Diagnosis present

## 2022-01-29 DIAGNOSIS — Z950 Presence of cardiac pacemaker: Secondary | ICD-10-CM

## 2022-01-29 DIAGNOSIS — F410 Panic disorder [episodic paroxysmal anxiety] without agoraphobia: Secondary | ICD-10-CM | POA: Diagnosis present

## 2022-01-29 DIAGNOSIS — R7401 Elevation of levels of liver transaminase levels: Secondary | ICD-10-CM | POA: Diagnosis present

## 2022-01-29 DIAGNOSIS — E440 Moderate protein-calorie malnutrition: Secondary | ICD-10-CM | POA: Diagnosis present

## 2022-01-29 DIAGNOSIS — I959 Hypotension, unspecified: Secondary | ICD-10-CM | POA: Diagnosis present

## 2022-01-29 DIAGNOSIS — I672 Cerebral atherosclerosis: Secondary | ICD-10-CM | POA: Diagnosis not present

## 2022-01-29 DIAGNOSIS — I495 Sick sinus syndrome: Secondary | ICD-10-CM | POA: Diagnosis present

## 2022-01-29 DIAGNOSIS — J90 Pleural effusion, not elsewhere classified: Secondary | ICD-10-CM | POA: Diagnosis not present

## 2022-01-29 LAB — CBC WITH DIFFERENTIAL/PLATELET
Abs Immature Granulocytes: 0.1 10*3/uL — ABNORMAL HIGH (ref 0.00–0.07)
Basophils Absolute: 0 10*3/uL (ref 0.0–0.1)
Basophils Relative: 0 %
Eosinophils Absolute: 0 10*3/uL (ref 0.0–0.5)
Eosinophils Relative: 0 %
HCT: 38.9 % — ABNORMAL LOW (ref 39.0–52.0)
Hemoglobin: 13.7 g/dL (ref 13.0–17.0)
Immature Granulocytes: 1 %
Lymphocytes Relative: 10 %
Lymphs Abs: 0.9 10*3/uL (ref 0.7–4.0)
MCH: 31.4 pg (ref 26.0–34.0)
MCHC: 35.2 g/dL (ref 30.0–36.0)
MCV: 89.2 fL (ref 80.0–100.0)
Monocytes Absolute: 0.6 10*3/uL (ref 0.1–1.0)
Monocytes Relative: 6 %
Neutro Abs: 8 10*3/uL — ABNORMAL HIGH (ref 1.7–7.7)
Neutrophils Relative %: 83 %
Platelets: 287 10*3/uL (ref 150–400)
RBC: 4.36 MIL/uL (ref 4.22–5.81)
RDW: 13.9 % (ref 11.5–15.5)
WBC: 9.6 10*3/uL (ref 4.0–10.5)
nRBC: 0 % (ref 0.0–0.2)

## 2022-01-29 LAB — GLUCOSE, CAPILLARY
Glucose-Capillary: 134 mg/dL — ABNORMAL HIGH (ref 70–99)
Glucose-Capillary: 170 mg/dL — ABNORMAL HIGH (ref 70–99)
Glucose-Capillary: 171 mg/dL — ABNORMAL HIGH (ref 70–99)
Glucose-Capillary: 155 mg/dL — ABNORMAL HIGH (ref 70–99)
Glucose-Capillary: 187 mg/dL — ABNORMAL HIGH (ref 70–99)

## 2022-01-29 LAB — COMPREHENSIVE METABOLIC PANEL
ALT: 170 U/L — ABNORMAL HIGH (ref 0–44)
AST: 303 U/L — ABNORMAL HIGH (ref 15–41)
Albumin: 3.1 g/dL — ABNORMAL LOW (ref 3.5–5.0)
Alkaline Phosphatase: 69 U/L (ref 38–126)
Anion gap: 12 (ref 5–15)
BUN: 22 mg/dL (ref 8–23)
CO2: 21 mmol/L — ABNORMAL LOW (ref 22–32)
Calcium: 8.9 mg/dL (ref 8.9–10.3)
Chloride: 107 mmol/L (ref 98–111)
Creatinine, Ser: 1.5 mg/dL — ABNORMAL HIGH (ref 0.61–1.24)
GFR, Estimated: 46 mL/min — ABNORMAL LOW (ref >60)
Glucose, Bld: 135 mg/dL — ABNORMAL HIGH (ref 70–99)
Potassium: 4.5 mmol/L (ref 3.5–5.1)
Sodium: 140 mmol/L (ref 135–145)
Total Bilirubin: 0.9 mg/dL (ref 0.3–1.2)
Total Protein: 6 g/dL — ABNORMAL LOW (ref 6.5–8.1)

## 2022-01-29 LAB — ECHOCARDIOGRAM COMPLETE
AR max vel: 0.78 cm2
AV Area VTI: 0.74 cm2
AV Area mean vel: 0.83 cm2
AV Mean grad: 42 mmHg
AV Peak grad: 65.6 mmHg
Ao pk vel: 4.05 m/s
Area-P 1/2: 2.83 cm2
Calc EF: 62 %
Height: 66 in
MV M vel: 5.89 m/s
MV Peak grad: 138.9 mmHg
MV VTI: 1.92 cm2
P 1/2 time: 485 msec
S' Lateral: 2.7 cm
Single Plane A2C EF: 60.1 %
Single Plane A4C EF: 62.6 %
Weight: 2627.88 oz

## 2022-01-29 LAB — LACTIC ACID, PLASMA
Lactic Acid, Venous: 3.7 mmol/L (ref 0.5–1.9)
Lactic Acid, Venous: 1.8 mmol/L (ref 0.5–1.9)

## 2022-01-29 LAB — MAGNESIUM: Magnesium: 2.1 mg/dL (ref 1.7–2.4)

## 2022-01-29 LAB — TSH: TSH: 1.947 u[IU]/mL (ref 0.350–4.500)

## 2022-01-29 MED ORDER — ACETAMINOPHEN 325 MG PO TABS: 650.0000 mg | ORAL_TABLET | Freq: Four times a day (QID) | ORAL | Status: DC | PRN

## 2022-01-29 MED ORDER — SODIUM CHLORIDE 0.9 % IV BOLUS
500.0000 mL | Freq: Once | INTRAVENOUS | Status: AC
  Administered 2022-01-29: 500 mL via INTRAVENOUS

## 2022-01-29 MED ORDER — ACETAMINOPHEN 650 MG RE SUPP: 650.0000 mg | Freq: Four times a day (QID) | RECTAL | Status: DC | PRN

## 2022-01-29 MED ORDER — LACTATED RINGERS IV SOLN
INTRAVENOUS | Status: DC
Start: 2022-01-29 — End: 2022-01-29
  Administered 2022-01-29: 50 mL/h via INTRAVENOUS

## 2022-01-29 MED ORDER — SIMVASTATIN 20 MG PO TABS
10.0000 mg | ORAL_TABLET | Freq: Every day | ORAL | Status: DC
  Administered 2022-01-29 – 2022-02-04 (×7): 10 mg via ORAL
  Filled 2022-01-29 (×7): qty 1

## 2022-01-29 MED ORDER — LEVOTHYROXINE SODIUM 25 MCG PO TABS
25.0000 ug | ORAL_TABLET | Freq: Every day | ORAL | Status: DC
  Administered 2022-01-29 – 2022-02-04 (×7): 25 ug via ORAL
  Filled 2022-01-29 (×7): qty 1

## 2022-01-29 MED ORDER — APIXABAN 2.5 MG PO TABS
2.5000 mg | ORAL_TABLET | Freq: Two times a day (BID) | ORAL | Status: DC
  Administered 2022-01-29 – 2022-01-30 (×3): 2.5 mg via ORAL
  Filled 2022-01-29 (×3): qty 1

## 2022-01-29 MED ORDER — SODIUM CHLORIDE 0.9 % IV SOLN: INTRAVENOUS | Status: DC

## 2022-01-29 MED ORDER — INSULIN ASPART 100 UNIT/ML IJ SOLN
0.0000 [IU] | Freq: Three times a day (TID) | INTRAMUSCULAR | Status: DC
  Administered 2022-01-29 (×2): 2 [IU] via SUBCUTANEOUS
  Administered 2022-01-29 – 2022-01-30 (×2): 1 [IU] via SUBCUTANEOUS
  Administered 2022-01-30: 2 [IU] via SUBCUTANEOUS
  Administered 2022-01-30: 3 [IU] via SUBCUTANEOUS
  Administered 2022-01-31: 2 [IU] via SUBCUTANEOUS

## 2022-01-29 NOTE — Assessment & Plan Note (Deleted)
 #)   Acquired hypothyroidism: Documented history of such, on Synthroid as an outpatient.  Plan: Continue home Synthroid.

## 2022-01-29 NOTE — Assessment & Plan Note (Deleted)
  #)   Acute Kidney Injury: Serum creatinine 1.51 compared to most recent prior value of 0.92 on 01/16/2022.  Suspect that this is multifactorial in nature with prerenal contribution from diminished renal perfusion as a consequence of presenting systemic hypotension, with potential additional pharmacologic exacerbation from home enalapril.    Plan: monitor strict I's & O's and daily weights. Attempt to avoid nephrotoxic agents.  Hold home enalapril.  Refrain from NSAIDs. Repeat CMP in the morning. Check serum magnesium level.  Check urinalysis with microscopy.  Add-on random urine sodium and random urine creatinine.  Continuous IV fluids, as above.

## 2022-01-29 NOTE — H&P (Signed)
History and Physical    PLEASE NOTE THAT DRAGON DICTATION SOFTWARE WAS USED IN THE CONSTRUCTION OF THIS NOTE.   Rashard Ryle IWO:[id number] DOB: 1941/02/18 DOA: 01/28/2022  PCP: Elsie Stain, MD  Patient coming from: home   I have personally briefly reviewed patient's old medical records in [place]  Chief Complaint: Diaphoresis  HPI: Lamar Meter is a 81 y.o. male with medical history significant for paroxysmal atrial fibrillation chronically anticoagulated on Eliquis, complicated by sick sinus syndrome status post pacemaker placement with recent pacemaker lead revision, essential hypertension, type 2 diabetes mellitus, quite hypothyroidism, hyperlipidemia, who is admitted to [facility] on 01/28/2022 with hypotension after presenting from home to [facility] ED complaining of diaphoresis.   In the setting of history of paroxysmal atrial fibrillation complicated by sick sinus syndrome, the patient recently underwent pacemaker placement with lead revision on 01/16/2022.  He underwent follow-up with outpatient cardiology earlier today, at which time interrogation of pacemaker showed evidence of atrial fibrillation on the preceding day.  Consequently, the patient was instructed to initiate metoprolol tartrate 25 mg p.o. twice daily as well as a 3-day course of Lasix.  Of note, the patient is chronically anticoagulated on Eliquis in the setting of this paroxysmal atrial fibrillation.  After returning home from cardiology clinic, he took aforementioned doses of metoprolol tartrate as well as Lasix, as prescribed.  Within approximately 30 minutes of doing so, he noted development of diaphoresis, lightheadedness, which ultimately led to his presentation to [facility] emergency department for further evaluation management thereof.  Denies any associated chest pain, nor any acute shortness of breath.  Denies any overt syncope, or associated falls.  No associate with any nausea/vomiting.   The patient  also reports development of numbness associate with the left lower extremity, starting on the afternoon of 01/28/2022, which is subsequently resolved with improving blood pressures in the emergency department this evening.  Not associate with any acute focal weakness, nor any additional acute focal sensory changes. Not a/w any facial droop, slurred speech, expressive aphasia, acute change in vision, dysphagia, vertigo.  The patient conveys that he experienced a very similar constellation of symptoms the only time he has taken metoprolol to tartrate, resulting in a similar presentation to the hospital at that time.  Denies any interval use of beta-blocker leading up to the single dose that he took earlier this afternoon.  Denies any recent subjective fever, chills, rigors, generalized myalgias.  No recent vomitus, new lower extremity edema, new calf tenderness or new lower extremity erythema.  No recent melena or hematochezia.  No recent trauma.     ED Course:  Vital signs in the ED were notable for the following: Afebrile; heart rates in the range of 60-63; initial blood pressure 79/57, socially improving to 104/85 following interval LR fluid bolus x1 L; respiratory rate 16-26; oxygen saturation 96 to 100% on room air.  Labs were notable for the following: CMP notable for the following: Potassium 5.8, although this sample was associated with hemolysis, creatinine 1.51 compared to most recent prior value of 0.92 on 01/16/2022, liver enzymes within normal limits.  High-sensitivity troponin I initially noted to be 14, with repeat value trending down to 13.  Initial lactate 8.5, with repeat value trending down to 5.8, followed by 3.7.  Procalcitonin less than 0.10.  CBC notable for episode count 9600, hemoglobin 13.7.  Imaging and additional notable ED work-up: EKG read as paced rhythm associate with heart rate 60 chest x-ray showed mild bibasilar  atelectasis without evidence of edema, also showing small  right pleural effusion.  CTA chest, abdomen, pelvis with dissection study showed no evidence of aortic aneurysm or dissection, also showing evidence of bibasilar atelectasis as well as small bilateral pleural effusions in the absence of infiltrate or pneumothorax.  CT head showed focal area of increased attenuation in the right parietal region, favoring contrast-enhancement in AVM as opposed to representing mass versus infarct versus hemorrhage.  Subsequently, MRI brain was ordered, with result currently pending.  Additionally, to further evaluate now resolved left lower extremity numbness, bilateral ABIs ordered, with results currently pending.  EDP performed bedside echocardiogram, which reportedly demonstrated no overt evidence of tamponade physiology.  EDP discussed patient's case with on-call cardiology, Dr. Harl Bowie, Who conveyed that cardiology will formally consult, and recommended and we will proceed to formal echocardiogram.  While in the ED, the following were administered: LR x1 L bolus followed by continuous LR.  The patient also received a brief course of epinephrine drip for less than 1 hour.  Subsequently, the patient was admitted for further evaluation and management of hypotension, with presentation also notable for now resolved left lower extremity numbness as well as lactic acidosis and acute kidney injury.    Review of Systems: As per HPI otherwise 10 point review of systems negative.   Past Medical History:  Diagnosis Date   Aneurysm ([place])    Atrial fibrillation ([place])    CAP (community acquired pneumonia) 10/20/2021   Diabetes mellitus without complication ([place])    Hypertension    Non-recurrent acute suppurative otitis media of right ear without spontaneous rupture of tympanic membrane 07/04/2021   Stroke [facility])     Past Surgical History:  Procedure Laterality Date   LEAD REVISION/REPAIR N/A 01/16/2022   Procedure: LEAD REVISION/REPAIR;  Surgeon: Evans Lance, MD;   Location: [facility];  Service: Cardiovascular;  Laterality: N/A;   PACEMAKER IMPLANT N/A 01/14/2022   Procedure: PACEMAKER IMPLANT;  Surgeon: Deboraha Sprang, MD;  Location: [facility];  Service: Cardiovascular;  Laterality: N/A;   skin cancer surgeries       Social History:  reports that he has never smoked. He has never used smokeless tobacco. He reports that he does not currently use alcohol. He reports that he does not currently use drugs.   No Known Allergies  Family history reviewed and not pertinent    Prior to Admission medications   Medication Sig Start Date End Date Taking? Authorizing Provider  Accu-Chek FastClix Lancets MISC Use to check blood sugar once daily. E11.9 02/26/21   Elsie Stain, MD  acetaminophen (TYLENOL) 500 MG tablet Take 1,000 mg by mouth daily as needed for moderate pain.    [provider]  apixaban (ELIQUIS) 5 MG TABS tablet Take 5 mg by mouth 2 (two) times daily. 07/13/19   [provider]  Blood Glucose Monitoring Suppl (ACCU-CHEK GUIDE ME) w/Device KIT USE AS DIRECTED 09/19/21   Elsie Stain, MD  Calcium Carbonate-Vitamin D (CALCIUM 600+D PO) Take 2 tablets by mouth daily.    [provider]  cholecalciferol (VITAMIN D3) 25 MCG (1000 UNIT) tablet Take 1,000 Units by mouth daily.    [provider]  enalapril (VASOTEC) 5 MG tablet Take 5 mg by mouth daily. 09/28/18   [provider]  finasteride (PROSCAR) 5 MG tablet TAKE 1 TABLET EVERY DAY 09/19/21   Elsie Stain, MD  furosemide (LASIX) 20 MG tablet Take 1 tablet by mouth  today and then daily for the next 3 days then stop. 01/28/22   Deboraha Sprang, MD  glucose blood (ACCU-CHEK GUIDE) test strip USE TO CHECK BLOOD SUGAR ONCE DAILY. 09/19/21   Elsie Stain, MD  levothyroxine (SYNTHROID) 25 MCG tablet Take 25 mcg by mouth daily before breakfast. 12/03/18   [provider]  magnesium oxide (MAG-OX) 400 MG tablet Take 400 mg  by mouth daily. 12/13/18   [provider]  metFORMIN (GLUCOPHAGE-XR) 500 MG 24 hr tablet Take 2 tablets (1,000 mg total) by mouth 2 (two) times daily. 09/19/21   Elsie Stain, MD  metoprolol tartrate (LOPRESSOR) 25 MG tablet Take 1 tablet (25 mg total) by mouth 2 (two) times daily. 01/28/22   Deboraha Sprang, MD  simvastatin (ZOCOR) 10 MG tablet Take 10 mg by mouth daily. 09/28/18   [provider]  verapamil (CALAN-SR) 240 MG CR tablet Take 1 tablet (240 mg total) by mouth daily. 11/21/21   Deboraha Sprang, MD     Objective    Physical Exam: Vitals:   01/29/22 0100 01/29/22 0200 01/29/22 0300 01/29/22 0409  BP: 103/79 102/65 101/64 (!) 120/52  Pulse: 63 60 60 69  Resp: (!) 32 (!) 27 (!) 31 (!) 30  Temp:    98.3 F (36.8 C)  TempSrc:    Oral  SpO2: 100% 98% 99% 98%  Weight:    74.5 kg  Height:    5' 6"  (1.676 m)    General: appears to be stated age; alert, oriented Skin: warm, dry, no rash Head:  AT/[place] Mouth:  Oral mucosa membranes appear moist, normal dentition Neck: supple; trachea midline Heart:  RRR; did not appreciate any M/R/G Lungs: CTAB, did not appreciate any wheezes, rales, or rhonchi Abdomen: + BS; soft, ND, NT Vascular: 2+ pedal pulses b/l; 2+ radial pulses b/l Extremities: no peripheral edema, no muscle wasting Neuro: strength and sensation intact in upper and lower extremities b/l    Labs on Admission: I have personally reviewed following labs and imaging studies  CBC: Recent Labs  Lab 01/28/22 1856 01/28/22 1905 01/29/22 0510  WBC 11.8*  --  9.6  NEUTROABS 7.3  --  8.0*  HGB 14.2 13.9 13.7  HCT 44.3 41.0 38.9*  MCV 94.7  --  89.2  PLT 408*  --  233   Basic Metabolic Panel: Recent Labs  Lab 01/28/22 1856 01/28/22 1905 01/29/22 0510  NA 137 135 140  K 5.8* 4.9 4.5  CL 106 107 107  CO2 15*  --  21*  GLUCOSE 303* 285* 135*  BUN 22 24* 22  CREATININE 1.51* 1.40* 1.50*  CALCIUM 9.2  --  8.9  MG  --   --  2.1    GFR: Estimated Creatinine Clearance: 34.3 mL/min (A) (by C-G formula based on SCr of 1.5 mg/dL (H)). Liver Function Tests: Recent Labs  Lab 01/28/22 1856 01/29/22 0510  AST 34 303*  ALT 12 170*  ALKPHOS 52 69  BILITOT 1.5* 0.9  PROT 6.4* 6.0*  ALBUMIN 3.5 3.1*   No results for input(s): "LIPASE", "AMYLASE" in the last 168 hours. No results for input(s): "AMMONIA" in the last 168 hours. Coagulation Profile: No results for input(s): "INR", "PROTIME" in the last 168 hours. Cardiac Enzymes: No results for input(s): "CKTOTAL", "CKMB", "CKMBINDEX", "TROPONINI" in the last 168 hours. BNP (last 3 results) No results for input(s): "PROBNP" in the last 8760 hours. HbA1C: No results for input(s): "HGBA1C" in the last 72  hours. CBG: Recent Labs  Lab 01/28/22 1909  GLUCAP 297*   Lipid Profile: No results for input(s): "CHOL", "HDL", "LDLCALC", "TRIG", "CHOLHDL", "LDLDIRECT" in the last 72 hours. Thyroid Function Tests: No results for input(s): "TSH", "T4TOTAL", "FREET4", "T3FREE", "THYROIDAB" in the last 72 hours. Anemia Panel: No results for input(s): "VITAMINB12", "FOLATE", "FERRITIN", "TIBC", "IRON", "RETICCTPCT" in the last 72 hours. Urine analysis:    Component Value Date/Time   COLORURINE YELLOW 01/16/2022 0756   APPEARANCEUR CLEAR 01/16/2022 0756   LABSPEC 1.019 01/16/2022 0756   PHURINE 5.0 01/16/2022 0756   GLUCOSEU NEGATIVE 01/16/2022 0756   HGBUR NEGATIVE 01/16/2022 0756   BILIRUBINUR NEGATIVE 01/16/2022 0756   BILIRUBINUR negative 12/26/2020 1104   KETONESUR NEGATIVE 01/16/2022 0756   PROTEINUR NEGATIVE 01/16/2022 0756   UROBILINOGEN 0.2 12/26/2020 1104   NITRITE NEGATIVE 01/16/2022 0756   LEUKOCYTESUR NEGATIVE 01/16/2022 0756    Radiological Exams on Admission: CT HEAD WO CONTRAST (5MM)  Result Date: 01/28/2022 CLINICAL DATA:  Acute neurological deficit.  Stroke suspected. EXAM: CT HEAD WITHOUT CONTRAST TECHNIQUE: Contiguous axial images were obtained from  the base of the skull through the vertex without intravenous contrast. RADIATION DOSE REDUCTION: This exam was performed according to the departmental dose-optimization program which includes automated exposure control, adjustment of the mA and/or kV according to patient size and/or use of iterative reconstruction technique. COMPARISON:  None Available. FINDINGS: Brain: Note that patient received contrast material for a CTA chest earlier this evening, severely limiting the ability to detect intracranial hemorrhage. There is mild diffuse cerebral atrophy. Low-attenuation changes in the deep white matter consistent with small vessel ischemia. 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 There is a focal area of increased density in the right parietal lobe extending to the cortical surface. This is probably an area of contrast enhancement, possibly representing a venous malformation, but this could also represent an area of infarct or acute intraparenchymal hemorrhage. Enhancing tumor is less likely. Consider MRI versus repeat CT in 24 hours after contrast material has washed out. No mass effect or midline shift. No abnormal extra-axial fluid collections. Gray-white matter junctions are distinct. Vascular: Vascular calcifications.  No aneurysm identified. Skull: Calvarium appears intact. Sinuses/Orbits: Paranasal sinuses and mastoid air cells are clear. Other: None. IMPRESSION: 1. Examination is technically limited due to residual IV contrast material from prior study. 2. Focal area of increased attenuation in the right parietal region. Differential diagnosis includes contrast enhancement in a AVM or less likely mass versus infarct or hemorrhage. Suggest MRI or repeat CT after contrast material has washed out. 3. Chronic atrophy and small vessel ischemic changes. Electronically Signed   By: Lucienne Capers M.D.   On: 01/28/2022 22:45   CT Angio Chest/Abd/Pel for Dissection W and/or Wo Contrast  Result Date:  01/28/2022 CLINICAL DATA:  Acute aortic syndrome (AAS) suspected. Shortness of breath. EXAM: CT ANGIOGRAPHY CHEST, ABDOMEN AND PELVIS TECHNIQUE: Non-contrast CT of the chest was initially obtained. Multidetector CT imaging through the chest, abdomen and pelvis was performed using the standard protocol during bolus administration of intravenous contrast. Multiplanar reconstructed images and MIPs were obtained and reviewed to evaluate the vascular anatomy. RADIATION DOSE REDUCTION: This exam was performed according to the departmental dose-optimization program which includes automated exposure control, adjustment of the mA and/or kV according to patient size and/or use of iterative reconstruction technique. CONTRAST:  237m OMNIPAQUE IOHEXOL 350 MG/ML SOLN COMPARISON:  01/16/2022 FINDINGS: CTA CHEST FINDINGS Cardiovascular: Heart is normal size. Small to moderate pericardial effusion, stable  since prior study. Extensive coronary artery calcifications. Moderate aortic calcifications. No evidence of aortic aneurysm or dissection. Mediastinum/Nodes: No mediastinal, hilar, or axillary adenopathy. Trachea and esophagus are unremarkable. Thyroid unremarkable. Lungs/Pleura: Small bilateral pleural effusions, new since prior study. Bibasilar atelectasis. Musculoskeletal: Left chest wall pacer noted. Chest wall soft tissues are unremarkable. No acute bony abnormality. Review of the MIP images confirms the above findings. CTA ABDOMEN AND PELVIS FINDINGS VASCULAR Aorta: Diffuse aortic atherosclerosis. No evidence of aortic aneurysm or dissection. Celiac: Widely patent. SMA: Widely patent Renals: Single bilaterally, widely patent IMA: Widely patent Inflow: Atherosclerotic calcifications diffusely. No aneurysm or dissection. Veins: No obvious venous abnormality within the limitations of this arterial phase study. Review of the MIP images confirms the above findings. NON-VASCULAR Hepatobiliary: Prior cholecystectomy.  No focal  hepatic abnormality. Pancreas: No focal abnormality or ductal dilatation. There is mild stranding seen adjacent to the pancreatic head and in the adjacent porta hepatis and adjacent to the descending duodenum. Also mild stranding adjacent to the pancreatic tail. This could reflect early acute pancreatitis. Recommend clinical correlation. Spleen: No focal abnormality.  Normal size. Adrenals/Urinary Tract: Adrenal glands unremarkable. Bilateral small renal cysts, appear benign. No follow-up imaging recommended. No hydronephrosis. Urinary bladder decompressed. Stomach/Bowel: Left colonic diverticulosis. No active diverticulitis. Stomach and small bowel decompressed, grossly unremarkable. Lymphatic: No adenopathy Reproductive: No visible focal abnormality. Other: No free fluid or free air. Bilateral inguinal hernias containing fat, left larger than right. Musculoskeletal: No acute bony abnormality. Review of the MIP images confirms the above findings. IMPRESSION: Aortic atherosclerosis. No evidence of aortic aneurysm or dissection. Small bilateral pleural effusions.  Bibasilar atelectasis. Mild stranding/edema noted adjacent to the pancreatic head and pancreatic tail raising the possibility of early acute pancreatitis. Recommend clinical correlation. Left colonic diverticulosis. Electronically Signed   By: Rolm Baptise M.D.   On: 01/28/2022 20:31   DG Chest Port 1 View  Result Date: 01/28/2022 CLINICAL DATA:  Presyncope EXAM: PORTABLE CHEST 1 VIEW COMPARISON:  None Available. FINDINGS: Mild bibasilar atelectasis or infiltrate. Small right pleural effusion is present. No pneumothorax. Cardiomegaly is stable. 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 Pulmonary vascularity is normal. No acute bone abnormality. IMPRESSION: 1. Mild bibasilar atelectasis or infiltrate. Small right pleural effusion. 2. Stable cardiomegaly. Electronically Signed   By: Fidela Salisbury M.D.   On: 01/28/2022 19:47   CUP PACEART  INCLINIC DEVICE CHECK  Result Date: 01/28/2022 Wound check appointment. Steri-strips removed. Wound without redness or edema. Incision edges approximated, wound well healed. Normal device function. RA/RV sensing, and impedances consistent with implant measurements. Unable to run thresholds secondary to RVR. Burden 99.8% with current RVR episode ongoing from 1 day, 10 hours ago. Patient symptomatic complaining of inability to take a deep breath. +OAC. Dr. Caryl Comes in to assess patient and patient added to Dr. Aquilla Hacker schedule today. Device programmed at 3.5V/auto capture programmed on for extra safety margin until 3 month visit. Histogram distribution appropriate for patient and level of activity.    EKG: Independently reviewed, with result as described above.    Assessment/Plan   Principal Problem:   Hypotension Active Problems:   Paroxysmal atrial fibrillation with RVR (HCC)   Type 2 diabetes mellitus without complication, without long-term current use of insulin (HCC)   Hypothyroidism   Dyslipidemia   AKI (acute kidney injury) ([place])   Numbness of left lower extremity   Lactic acidosis      #) Hypotension: In the context of a documented history of essential hypertension, initial systolic  blood pressures in the 70s, subsequent holding of home beta-blocker.  This occurred in ED) 70 to initiation of metoprolol tartrate as well as Lasix as an outpatient, raising possibility of limited to hypovolemic contribution in the setting of dehydration, exacerbated by aforementioned Lasix as well as diminished compensatory tachycardic response in the setting of new beta-blocker.  Subsequent improvement in blood pressure following interval IV fluids also appears to support an element of hypovolemia.  No evidence of acute blood loss.  No evidence of underlying infection and criteria for sepsis not currently met.  CTA chest, abdomen, pelvis showed no evidence of aortic aneurysm or dissection.  Presentation is  also less suggestive of acute pulmonary embolism, particularly given chronic anticoagulation on Eliquis.  Of note, I am considering additional potential obstructive sources for patient's initial hypotension, bedside echocardiogram reportedly showed no evidence of tamponade physiology, as further detailed above, with complete echocardiogram pending at this time.  ACS also appears less likely at this time, in the absence of any associated chest pain, troponin x2 found to be nonelevated, and trending down, with EKG demonstrating no evidence of acute ischemic changes, following review by cardiology, as above. Of note, chart review also reviews some documentation of moderate AS.   Plan: Monitor on symmetry.  Echocardiogram.  Add on serum magnesium level.  Repeat CMP and CBC in the morning.  Cardiology consulted with additional recommendations We will Review continue diet hold metoprolol tartrate but also follow-up tomorrow outpatient tadalafil and verapamil for now.  Check urinalysis.  Continuous LR.        #) Left lower extremity numbness: Transient left lower extremity numbness that started on the afternoon of 01/28/2022, and in blood pressure, and subsequently resolving following improvement in systemic perfusion.  Differential includes TIA, potentially on basis of supply/demand mismatch in the context of systemic hypotension, including syncope.  I will increase the patient region, radiology conveyed this finding the orders, pharmacy needs within today.  MS positive presenting a mass, acute ischemic infarct or hemorrhage.  MRI brain has been ordered to further evaluate, with result currently pending.  Additionally, differential includes peripheral hypoperfusion as consequence of of systemic hypoperfusion.  At this time, there palpable peripheral pulses x4 extremities, with left lower extremity warm neurovascular intact.  No evidence of residual acute focal neurologic deficits.  Plan: Follow-up result 3,  ABI bilaterally.  4-hour neurochecks x3 occurrences.  Further evaluation management of presenting hypotension, as above.        #) Lactic acidosis: Initially elevated lactate of 8.7, which subsequent to the trended down to 3.7.  Likely multifactorial in nature, with contributions from diffuse systemic hypoperfusion in the setting of initial hypotension, with additional contributions including acute kidney injury as well as possible increased from type B lactic acidosis in setting of metformin superimposed on AKI.  Plan: Continuous lactated Ringer's.  Repeat lactate in the morning.  Monitor strict I's and O's and daily weights.  Further evaluation management of AKI as well as hypotension.  Follow-up result of MRI brain, as above.  Hold home metformin for now.  Pete CMP in the morning.  Check INR.          #) Acute Kidney Injury: Serum creatinine 1.51 compared to most recent prior value of 0.92 on 01/16/2022.  Suspect that this is multifactorial in nature with prerenal contribution from diminished renal perfusion as a consequence of presenting systemic hypotension, with potential additional pharmacologic exacerbation from home enalapril.    Plan: monitor strict I's & O's  and daily weights. Attempt to avoid nephrotoxic agents.  Hold home enalapril.  Refrain from NSAIDs. Repeat CMP in the morning. Check serum magnesium level.  Check urinalysis with microscopy.  Add-on random urine sodium and random urine creatinine.  Continuous IV fluids, as above.           #) Paroxysmal atrial fibrillation: Documented history of such. In setting of CHA2DS2-VASc score of  4 , there is an indication for chronic anticoagulation for thromboembolic prophylaxis. Consistent with this, patient is chronically anticoagulated on  eliquis. Home AV nodal blocking regimen: verapamil.  Complicated by sick sinus syndrome status post recent pacemaker placement with pacer lead revision on 01/16/2022.  Appears to be in  sinus rhythm at this time with EKG revealing paced rhythm, as further detailed above.    Plan: monitor strict I's & O's and daily weights. Repeat BMP/CBC in AM. Check serum mag level.  Setting of presenting hypotension, will hold home verapamil, will not administer any additional doses of beta-blocker this time.  Continue home Eliquis.  Monitor on telemetry.  Cardiology consulted, as further detailed above, with request for complete echocardiogram, and additional recommendations pending at this time.  Echocardiogram ordered for the morning.            #) Type 2 Diabetes Mellitus: documented history of such. Home insulin regimen: None. Home oral hypoglycemic agents: Metformin.  Appears well controlled as outpatient, most recent hemoglobin A1c noted to be 5.9% in April 2023.     Plan: accuchecks QAC and HS with low dose SSI. hold home oral hypoglycemic agents during this hospitalization.          #) Acquired hypothyroidism: Documented history of such, on Synthroid as an outpatient.  Plan: Continue home Synthroid.          #) Hyperlipidemia: documented h/o such. On simvastatin as outpatient.    Plan: continue home statin.         DVT prophylaxis: SCD's + Eliquis Code Status: Full code Family Communication: none Disposition Plan: Per Rounding Team Consults called: EDP discussed w/ on-call cards (Dr. Harl Bowie), who conveyed that cardiology would formally consult, as further detailed above;  Admission status: Inpatient; pcu   PLEASE NOTE THAT DRAGON DICTATION SOFTWARE WAS USED IN THE CONSTRUCTION OF THIS NOTE.   Severn DO [facility]  From [place]   01/29/2022, 6:57 AM

## 2022-01-29 NOTE — Assessment & Plan Note (Deleted)
 #)   Lactic acidosis: Initially elevated lactate of 8.7, which subsequent to the trended down to 3.7.  Likely multifactorial in nature, with contributions from diffuse systemic hypoperfusion in the setting of initial hypotension, with additional contributions including acute kidney injury as well as possible increased from type B lactic acidosis in setting of metformin superimposed on AKI.  Plan: Continuous lactated Ringer's.  Repeat lactate in the morning.  Monitor strict I's and O's and daily weights.  Further evaluation management of AKI as well as hypotension.  Follow-up result of MRI brain, as above.  Hold home metformin for now.  Pete CMP in the morning.  Check INR.

## 2022-01-29 NOTE — Assessment & Plan Note (Deleted)
 #)   Left lower extremity numbness: Transient left lower extremity numbness that started on the afternoon of 01/28/2022, and in blood pressure, and subsequently resolving following improvement in systemic perfusion.  Differential includes TIA, potentially on basis of supply/demand mismatch in the context of systemic hypotension, including syncope.  I will increase the patient region, radiology conveyed this finding the orders, pharmacy needs within today.  MS positive presenting a mass, acute ischemic infarct or hemorrhage.  MRI brain has been ordered to further evaluate, with result currently pending.  Additionally, differential includes peripheral hypoperfusion as consequence of of systemic hypoperfusion.  At this time, there palpable peripheral pulses x4 extremities, with left lower extremity warm neurovascular intact.  No evidence of residual acute focal neurologic deficits.  Plan: Follow-up result 3, ABI bilaterally.  4-hour neurochecks x3 occurrences.  Further evaluation management of presenting hypotension, as above.

## 2022-01-29 NOTE — Assessment & Plan Note (Deleted)
 #)   Hypotension: In the context of a documented history of essential hypertension, initial systolic blood pressures in the 70s, subsequent holding of home beta-blocker.  This occurred in ED) 70 to initiation of metoprolol tartrate as well as Lasix as an outpatient, raising possibility of limited to hypovolemic contribution in the setting of dehydration, exacerbated by aforementioned Lasix as well as diminished compensatory tachycardic response in the setting of new beta-blocker.  Subsequent improvement in blood pressure following interval IV fluids also appears to support an element of hypovolemia.  No evidence of acute blood loss.  No evidence of underlying infection and criteria for sepsis not currently met.  CTA chest, abdomen, pelvis showed no evidence of aortic aneurysm or dissection.  Presentation is also less suggestive of acute pulmonary embolism, particularly given chronic anticoagulation on Eliquis.  Of note, I am considering additional potential obstructive sources for patient's initial hypotension, bedside echocardiogram reportedly showed no evidence of tamponade physiology, as further detailed above, with complete echocardiogram pending at this time.  ACS also appears less likely at this time, in the absence of any associated chest pain, troponin x2 found to be nonelevated, and trending down, with EKG demonstrating no evidence of acute ischemic changes, following review by cardiology, as above. Of note, chart review also reviews some documentation of moderate AS.   Plan: Monitor on symmetry.  Echocardiogram.  Add on serum magnesium level.  Repeat CMP and CBC in the morning.  Cardiology consulted with additional recommendations We will Review continue diet hold metoprolol tartrate but also follow-up tomorrow outpatient tadalafil and verapamil for now.  Check urinalysis.  Continuous LR.    

## 2022-01-29 NOTE — Progress Notes (Signed)
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 Recently has undergone pacemaker placement followed by lead revision due to microperforation complication.  On follow-up visit found to have A-fib and was started on metoprolol outpatient.  After 2 doses of metoprolol as well as Lasix he started having diaphoresis lightheadedness and decided to come to the [facility] further work-up.  Found to have hypovolemic shock treated with IV fluids. Cardiology following.  Echocardiogram shows moderate pericardial effusion. Assessment and Plan:   Hypotension   AKI (acute kidney injury) ([place])   Lactic acidosis   Hypovolemic shock (HCC)   Transaminitis Presents with fatigue and tiredness after taking metoprolol and Lasix. On arrival blood pressure was in 70s.  Heart rate was in 50s. Lactic acid level was 8.5.  Serum creatinine at baseline is normal and on admission 1.51.  Potassium level 5.8. Resulted presents hypovolemic shock in the setting of use of metoprolol and Lasix as well as in the setting of severe aortic stenosis and pericardial effusion. At present volume status is improved.  Lactic acidosis has improved. Patient appears to have worsening LFT with levels in 300 and renal function still appears to be elevated. For now we will continue with IV hydration. Holding antihypertensive regimen. Monitor. No evidence of infection.  Procalcitonin level negative.  No other acute complaints provided by the patient.    Severe aortic stenosis Prior to this admission echocardiogram was showing moderate aortic stenosis.  But echocardiogram this admission shows low flow severe aortic stenosis. Cardiology considering  outpatient work-up.    Chronic anticoagulation   Complication associated with cardiac pacemaker lead   Tachycardia-bradycardia syndrome (HCC)   Moderate pericardial effusion   History of cardiac pacemaker   PAF (paroxysmal atrial fibrillation) (HCC) On Eliquis which we will continue at 2.5 mg due to renal dysfunction and age. Recently had lead revision and pacemaker implant and therefore not a good candidate for MRI. Cardiology consulting EP for evaluation for possible lead induced pericardial effusion. Echocardiogram shows no tamponade physiology but does show evidence of RV collapse. Management per cardiology and EP.  Numbness of left lower extremity Currently does not have any focal deficit on my examination. CT scan of the head most likely contrast-induced abnormality and not any acute stroke. We will repeat another CT scan on 6/30. Not a great for MRI due to recent history lead placement.    Chronic diastolic CHF (congestive heart failure) ([place]) While the patient has mild JVD volume status appears to be adequate. We will continue with fluids but be mindful for volume overload.    Dyslipidemia Continue statin.    OSA on CPAP Continue CPAP nightly.    Hypothyroidism Continue Synthroid. TSH normal.    Type 2 diabetes mellitus without complication, without long-term current use of insulin Continue sliding scale insulin.    BPH (benign prostatic hyperplasia) No evidence of obstruction.  Continue current regimen.    Essential hypertension Holding antihypertensive regimen.  Subjective: Feeling better.  No nausea no vomiting.  Still feeling fatigue and tiredness.  No chest pain abdominal pain.  No focal deficit reported by the patient.  No numbness.  Physical Exam: Vitals:   01/29/22 0735 01/29/22 1218 01/29/22 1228 01/29/22 1546  BP:  120/63  (!) 82/59  Pulse: 65 72  78  Resp: 20 (!) 26 19 (!) 24  Temp:  98.3 F (36.8 C)  98.7 F (37.1 C)  TempSrc:  Oral  Oral   SpO2: 95% 95%  91%  Weight:      Height:       General: Appear in mild distress; no visible Abnormal Neck Mass Or lumps, Conjunctiva normal Cardiovascular: S1 and S2 Present, no Murmur, Respiratory: good respiratory effort, Bilateral Air entry present and CTA, no Crackles, no wheezes Abdomen: Bowel Sound present, Non tender  Extremities: no Pedal edema Neurology: alert and oriented to time, place, and person  Gait not checked due to patient safety concerns   Data Reviewed: I have Reviewed nursing notes, Vitals, and Lab results since pt's last encounter. Pertinent lab results CBC and BMP I have ordered test including CBC and BMP I have reviewed the last note from cardiology,  I have discussed pt's care plan and test results with cardiology.   Family Communication: None at bedside  Disposition: Status is: Inpatient Remains inpatient appropriate because: Need further work-up for pericardial effusion as well as need for evaluation for EP for complication of pacemaker as well as need to monitor for improvement in LFT and AKI.  Author: Berle Mull, MD 01/29/2022 7:48 PM  Please look on [email or web address] to find out who is on call.

## 2022-01-29 NOTE — Progress Notes (Signed)
Progress Note  Patient Name: Edward Butler Date of Encounter: 01/29/2022  [facility] Cardiologist: Dina Rich, MD   Subjective   He states he was feeling very poor yesterday. He reports improved SOB after taking Lasix. He reports feeling like dying after taking metoprolol x2 doses so far. He had severe abdominal pain yesterday PM and all night. He states he is feeling better now, no longer having abdominal pain, heart is not racing anymore. He did not have any SOB, rash since taking metoprolol.   Inpatient Medications    Scheduled Meds:  apixaban  2.5 mg Oral BID   insulin aspart  0-9 Units Subcutaneous TID WC   levothyroxine  25 mcg Oral Q0600   simvastatin  10 mg Oral Daily   Continuous Infusions:  sodium chloride 125 mL/hr at 01/29/22 1032   PRN Meds: acetaminophen **OR** acetaminophen   Vital Signs    Vitals:   01/29/22 0300 01/29/22 0409 01/29/22 0733 01/29/22 0735  BP: 101/64 (!) 120/52 108/67   Pulse: 60 69 66 65  Resp: (!) 31 (!) 30 (!) 30 20  Temp:  98.3 F (36.8 C) 97.9 F (36.6 C)   TempSrc:  Oral Oral   SpO2: 99% 98% 96% 95%  Weight:  74.5 kg    Height:  '5\' 6"'$  (1.676 m)      Intake/Output Summary (Last 24 hours) at 01/29/2022 1037 Last data filed at 01/29/2022 0424 Gross per 24 hour  Intake 1000 ml  Output 300 ml  Net 700 ml      01/29/2022    4:09 AM 01/14/2022    6:38 AM 11/21/2021    9:20 AM  Last 3 Weights  Weight (lbs) 164 lb 3.9 oz 160 lb 158 lb  Weight (kg) 74.5 kg 72.576 kg 71.668 kg      Telemetry    Sinus rhythm  - Personally Reviewed  ECG    N/A today - Personally Reviewed  Physical Exam   GEN: No acute distress.   Neck: Short and thick neck  Cardiac: RRR, systolic murmur grade II Respiratory: Clear to auscultation bilaterally. On room air  GI: Soft, nontender MS: No leg edema Neuro:  Nonfocal  Psych: Normal affect   Labs    High Sensitivity Troponin:   Recent Labs  Lab 01/16/22 0659 01/16/22 0942  01/28/22 1856 01/28/22 2025  TROPONINIHS 23* 19* 14 13     Chemistry Recent Labs  Lab 01/28/22 1856 01/28/22 1905 01/29/22 0510  NA 137 135 140  K 5.8* 4.9 4.5  CL 106 107 107  CO2 15*  --  21*  GLUCOSE 303* 285* 135*  BUN 22 24* 22  CREATININE 1.51* 1.40* 1.50*  CALCIUM 9.2  --  8.9  MG  --   --  2.1  PROT 6.4*  --  6.0*  ALBUMIN 3.5  --  3.1*  AST 34  --  303*  ALT 12  --  170*  ALKPHOS 52  --  69  BILITOT 1.5*  --  0.9  GFRNONAA 46*  --  46*  ANIONGAP 16*  --  12    Lipids No results for input(s): "CHOL", "TRIG", "HDL", "LABVLDL", "LDLCALC", "CHOLHDL" in the last 168 hours.  Hematology Recent Labs  Lab 01/28/22 1856 01/28/22 1905 01/29/22 0510  WBC 11.8*  --  9.6  RBC 4.68  --  4.36  HGB 14.2 13.9 13.7  HCT 44.3 41.0 38.9*  MCV 94.7  --  89.2  MCH 30.3  --  31.4  MCHC 32.1  --  35.2  RDW 14.0  --  13.9  PLT 408*  --  287   Thyroid No results for input(s): "TSH", "FREET4" in the last 168 hours.  BNP Recent Labs  Lab 01/28/22 2025  BNP 377.5*    DDimer No results for input(s): "DDIMER" in the last 168 hours.   Radiology    ECHOCARDIOGRAM COMPLETE  Result Date: 01/29/2022    ECHOCARDIOGRAM REPORT   Patient Name:   KAYLE CORREA Date of Exam: 01/29/2022 Medical Rec #:  [id number]     Height:       66.0 in Accession #:    [id number]    Weight:       164.2 lb Date of Birth:  [birth year]      BSA:          1.839 m Patient Age:    81 years      BP:           108/67 mmHg Patient Gender: M             HR:           69 bpm. Exam Location:  Inpatient Procedure: 2D Echo, Cardiac Doppler and Color Doppler STAT ECHO Indications:    Aortic valve disorder  History:        Patient has prior history of Echocardiogram examinations. Risk                 Factors:Dyslipidemia, Diabetes and Hypertension.  Sonographer:    Jyl Heinz Referring Phys: [id number] Regan Lemming IMPRESSIONS  1. Left ventricular ejection fraction, by estimation, is 60 to 65%. The left ventricle has normal  function. The left ventricle has no regional wall motion abnormalities. There is mild concentric left ventricular hypertrophy. Left ventricular diastolic parameters are consistent with Grade II diastolic dysfunction (pseudonormalization). Elevated left ventricular end-diastolic pressure.  2. Right ventricular systolic function is mildly reduced. The right ventricular size is normal. There is moderately elevated pulmonary artery systolic pressure. The estimated right ventricular systolic pressure is 02.5 mmHg.  3. Left atrial size was mildly dilated.  4. Right atrial size was mildly dilated.  5. Pericardial effusion measures 1.51cm posteriorly at greatest diameter.. Moderate pericardial effusion. The pericardial effusion is circumferential.  6. The mitral valve is degenerative. Mild to moderate mitral valve regurgitation. No evidence of mitral stenosis. Moderate mitral annular calcification.  7. The aortic valve is calcified. There is severe calcifcation of the aortic valve. There is severe thickening of the aortic valve. Aortic valve regurgitation is mild. Severe aortic valve stenosis. Aortic regurgitation PHT measures 485 msec. Aortic valve area, by VTI measures 0.74 cm. Aortic valve mean gradient measures 42.0 mmHg. Aortic valve Vmax measures 4.05 m/s.  8. The inferior vena cava is normal in size with greater than 50% respiratory variability, suggesting right atrial pressure of 3 mmHg.  9. Compared to study dated 10/29/2021, the mean AVG has increased from 36.61mg to 466mg, DVI has decreased from 0.35 to 0.24, Vmax has increased from 3.884mto 4.80m53mnd AVA has decreased from 0.99cm2 to 0.74cm2 all consistent with now severe AS. FINDINGS  Left Ventricle: Left ventricular ejection fraction, by estimation, is 60 to 65%. The left ventricle has normal function. The left ventricle has no regional wall motion abnormalities. The left ventricular internal cavity size was normal in size. There is  mild concentric left  ventricular hypertrophy. Left ventricular diastolic parameters are consistent with Grade II diastolic dysfunction (pseudonormalization). Elevated left  ventricular end-diastolic pressure. Right Ventricle: The right ventricular size is normal. No increase in right ventricular wall thickness. Right ventricular systolic function is mildly reduced. There is moderately elevated pulmonary artery systolic pressure. The tricuspid regurgitant velocity is 2.84 m/s, and with an assumed right atrial pressure of 15 mmHg, the estimated right ventricular systolic pressure is 35.3 mmHg. Left Atrium: Left atrial size was mildly dilated. Right Atrium: Right atrial size was mildly dilated. Pericardium: Pericardial effusion measures 1.51cm posteriorly at greatest diameter. A moderately sized pericardial effusion is present. The pericardial effusion is circumferential. Mitral Valve: The mitral valve is degenerative in appearance. There is mild calcification of the mitral valve leaflet(s). Moderate mitral annular calcification. Mild to moderate mitral valve regurgitation. No evidence of mitral valve stenosis. MV peak gradient, 8.9 mmHg. The mean mitral valve gradient is 3.0 mmHg. Tricuspid Valve: The tricuspid valve is normal in structure. Tricuspid valve regurgitation is mild . No evidence of tricuspid stenosis. Aortic Valve: The aortic valve is calcified. There is severe calcifcation of the aortic valve. There is severe thickening of the aortic valve. Aortic valve regurgitation is mild. Aortic regurgitation PHT measures 485 msec. Severe aortic stenosis is present. Aortic valve mean gradient measures 42.0 mmHg. Aortic valve peak gradient measures 65.6 mmHg. Aortic valve area, by VTI measures 0.74 cm. Pulmonic Valve: The pulmonic valve was normal in structure. Pulmonic valve regurgitation is not visualized. No evidence of pulmonic stenosis. Aorta: The aortic root is normal in size and structure. Venous: The inferior vena cava is normal  in size with greater than 50% respiratory variability, suggesting right atrial pressure of 3 mmHg. IAS/Shunts: No atrial level shunt detected by color flow Doppler. Additional Comments: A device lead is visualized.  LEFT VENTRICLE PLAX 2D LVIDd:         4.20 cm      Diastology LVIDs:         2.70 cm      LV e' medial:    4.99 cm/s LV PW:         1.20 cm      LV E/e' medial:  29.5 LV IVS:        1.40 cm      LV e' lateral:   4.21 cm/s LVOT diam:     2.00 cm      LV E/e' lateral: 34.9 LV SV:         76 LV SV Index:   41 LVOT Area:     3.14 cm  LV Volumes (MOD) LV vol d, MOD A2C: 90.5 ml LV vol d, MOD A4C: 102.0 ml LV vol s, MOD A2C: 36.1 ml LV vol s, MOD A4C: 38.1 ml LV SV MOD A2C:     54.4 ml LV SV MOD A4C:     102.0 ml LV SV MOD BP:      60.6 ml RIGHT VENTRICLE            IVC RV Basal diam:  3.60 cm    IVC diam: 2.30 cm RV Mid diam:    3.20 cm RV S prime:     7.36 cm/s TAPSE (M-mode): 1.5 cm LEFT ATRIUM             Index        RIGHT ATRIUM           Index LA diam:        4.50 cm 2.45 cm/m   RA Area:     20.40 cm LA Vol (A2C):  51.8 ml 28.16 ml/m  RA Volume:   62.20 ml  33.82 ml/m LA Vol (A4C):   84.5 ml 45.94 ml/m LA Biplane Vol: 72.1 ml 39.20 ml/m  AORTIC VALVE AV Area (Vmax):    0.78 cm AV Area (Vmean):   0.83 cm AV Area (VTI):     0.74 cm AV Vmax:           405.00 cm/s AV Vmean:          290.600 cm/s AV VTI:            1.020 m AV Peak Grad:      65.6 mmHg AV Mean Grad:      42.0 mmHg LVOT Vmax:         100.70 cm/s LVOT Vmean:        77.150 cm/s LVOT VTI:          0.242 m LVOT/AV VTI ratio: 0.24 AI PHT:            485 msec  AORTA Ao Root diam: 2.80 cm Ao Asc diam:  3.00 cm MITRAL VALVE                TRICUSPID VALVE MV Area (PHT): 2.83 cm     TR Peak grad:   32.3 mmHg MV Area VTI:   1.92 cm     TR Vmax:        284.00 cm/s MV Peak grad:  8.9 mmHg MV Mean grad:  3.0 mmHg     SHUNTS MV Vmax:       1.49 m/s     Systemic VTI:  0.24 m MV Vmean:      85.3 cm/s    Systemic Diam: 2.00 cm MV Decel Time: 268  msec MR Peak grad: 138.9 mmHg MR Mean grad: 97.0 mmHg MR Vmax:      589.33 cm/s MR Vmean:     478.0 cm/s MV E velocity: 147.00 cm/s MV A velocity: 121.00 cm/s MV E/A ratio:  1.21 Fransico Him MD Electronically signed by Fransico Him MD Signature Date/Time: 01/29/2022/9:19:21 AM    Final    CT HEAD WO CONTRAST (5MM)  Result Date: 01/28/2022 CLINICAL DATA:  Acute neurological deficit.  Stroke suspected. EXAM: CT HEAD WITHOUT CONTRAST TECHNIQUE: Contiguous axial images were obtained from the base of the skull through the vertex without intravenous contrast. RADIATION DOSE REDUCTION: This exam was performed according to the departmental dose-optimization program which includes automated exposure control, adjustment of the mA and/or kV according to patient size and/or use of iterative reconstruction technique. COMPARISON:  None Available. FINDINGS: Brain: Note that patient received contrast material for a CTA chest earlier this evening, severely limiting the ability to detect intracranial hemorrhage. There is mild diffuse cerebral atrophy. Low-attenuation changes in the deep white matter consistent with small vessel ischemia. 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 There is a focal area of increased density in the right parietal lobe extending to the cortical surface. This is probably an area of contrast enhancement, possibly representing a venous malformation, but this could also represent an area of infarct or acute intraparenchymal hemorrhage. Enhancing tumor is less likely. Consider MRI versus repeat CT in 24 hours after contrast material has washed out. No mass effect or midline shift. No abnormal extra-axial fluid collections. Gray-white matter junctions are distinct. Vascular: Vascular calcifications.  No aneurysm identified. Skull: Calvarium appears intact. Sinuses/Orbits: Paranasal sinuses and mastoid air cells are clear. Other: None. IMPRESSION: 1. Examination is technically limited due  to residual IV  contrast material from prior study. 2. Focal area of increased attenuation in the right parietal region. Differential diagnosis includes contrast enhancement in a AVM or less likely mass versus infarct or hemorrhage. Suggest MRI or repeat CT after contrast material has washed out. 3. Chronic atrophy and small vessel ischemic changes. Electronically Signed   By: Lucienne Capers M.D.   On: 01/28/2022 22:45   CT Angio Chest/Abd/Pel for Dissection W and/or Wo Contrast  Result Date: 01/28/2022 CLINICAL DATA:  Acute aortic syndrome (AAS) suspected. Shortness of breath. EXAM: CT ANGIOGRAPHY CHEST, ABDOMEN AND PELVIS TECHNIQUE: Non-contrast CT of the chest was initially obtained. Multidetector CT imaging through the chest, abdomen and pelvis was performed using the standard protocol during bolus administration of intravenous contrast. Multiplanar reconstructed images and MIPs were obtained and reviewed to evaluate the vascular anatomy. RADIATION DOSE REDUCTION: This exam was performed according to the departmental dose-optimization program which includes automated exposure control, adjustment of the mA and/or kV according to patient size and/or use of iterative reconstruction technique. CONTRAST:  288m OMNIPAQUE IOHEXOL 350 MG/ML SOLN COMPARISON:  01/16/2022 FINDINGS: CTA CHEST FINDINGS Cardiovascular: Heart is normal size. Small to moderate pericardial effusion, stable since prior study. Extensive coronary artery calcifications. Moderate aortic calcifications. No evidence of aortic aneurysm or dissection. Mediastinum/Nodes: No mediastinal, hilar, or axillary adenopathy. Trachea and esophagus are unremarkable. Thyroid unremarkable. Lungs/Pleura: Small bilateral pleural effusions, new since prior study. Bibasilar atelectasis. Musculoskeletal: Left chest wall pacer noted. Chest wall soft tissues are unremarkable. No acute bony abnormality. Review of the MIP images confirms the above findings. CTA ABDOMEN AND PELVIS  FINDINGS VASCULAR Aorta: Diffuse aortic atherosclerosis. No evidence of aortic aneurysm or dissection. Celiac: Widely patent. SMA: Widely patent Renals: Single bilaterally, widely patent IMA: Widely patent Inflow: Atherosclerotic calcifications diffusely. No aneurysm or dissection. Veins: No obvious venous abnormality within the limitations of this arterial phase study. Review of the MIP images confirms the above findings. NON-VASCULAR Hepatobiliary: Prior cholecystectomy.  No focal hepatic abnormality. Pancreas: No focal abnormality or ductal dilatation. There is mild stranding seen adjacent to the pancreatic head and in the adjacent porta hepatis and adjacent to the descending duodenum. Also mild stranding adjacent to the pancreatic tail. This could reflect early acute pancreatitis. Recommend clinical correlation. Spleen: No focal abnormality.  Normal size. Adrenals/Urinary Tract: Adrenal glands unremarkable. Bilateral small renal cysts, appear benign. No follow-up imaging recommended. No hydronephrosis. Urinary bladder decompressed. Stomach/Bowel: Left colonic diverticulosis. No active diverticulitis. Stomach and small bowel decompressed, grossly unremarkable. Lymphatic: No adenopathy Reproductive: No visible focal abnormality. Other: No free fluid or free air. Bilateral inguinal hernias containing fat, left larger than right. Musculoskeletal: No acute bony abnormality. Review of the MIP images confirms the above findings. IMPRESSION: Aortic atherosclerosis. No evidence of aortic aneurysm or dissection. Small bilateral pleural effusions.  Bibasilar atelectasis. Mild stranding/edema noted adjacent to the pancreatic head and pancreatic tail raising the possibility of early acute pancreatitis. Recommend clinical correlation. Left colonic diverticulosis. Electronically Signed   By: KRolm BaptiseM.D.   On: 01/28/2022 20:31   DG Chest Port 1 View  Result Date: 01/28/2022 CLINICAL DATA:  Presyncope EXAM: PORTABLE  CHEST 1 VIEW COMPARISON:  None Available. FINDINGS: Mild bibasilar atelectasis or infiltrate. Small right pleural effusion is present. No pneumothorax. Cardiomegaly is stable. 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 Pulmonary vascularity is normal. No acute bone abnormality. IMPRESSION: 1. Mild bibasilar atelectasis or infiltrate. Small right pleural effusion. 2. Stable cardiomegaly. Electronically Signed   By:  Fidela Salisbury M.D.   On: 01/28/2022 19:47   CUP PACEART INCLINIC DEVICE CHECK  Result Date: 01/28/2022 Wound check appointment. Steri-strips removed. Wound without redness or edema. Incision edges approximated, wound well healed. Normal device function. RA/RV sensing, and impedances consistent with implant measurements. Unable to run thresholds secondary to RVR. Burden 99.8% with current RVR episode ongoing from 1 day, 10 hours ago. Patient symptomatic complaining of inability to take a deep breath. +OAC. Dr. Caryl Comes in to assess patient and patient added to Dr. Aquilla Hacker schedule today. Device programmed at 3.5V/auto capture programmed on for extra safety margin until 3 month visit. Histogram distribution appropriate for patient and level of activity.   Cardiac Studies    Echo from 01/29/22:   1. Left ventricular ejection fraction, by estimation, is 60 to 65%. The  left ventricle has normal function. The left ventricle has no regional  wall motion abnormalities. There is mild concentric left ventricular  hypertrophy. Left ventricular diastolic  parameters are consistent with Grade II diastolic dysfunction  (pseudonormalization). Elevated left ventricular end-diastolic pressure.   2. Right ventricular systolic function is mildly reduced. The right  ventricular size is normal. There is moderately elevated pulmonary artery  systolic pressure. The estimated right ventricular systolic pressure is  59.5 mmHg.   3. Left atrial size was mildly dilated.   4. Right atrial size was mildly  dilated.   5. Pericardial effusion measures 1.51cm posteriorly at greatest  diameter.. Moderate pericardial effusion. The pericardial effusion is  circumferential.   6. The mitral valve is degenerative. Mild to moderate mitral valve  regurgitation. No evidence of mitral stenosis. Moderate mitral annular  calcification.   7. The aortic valve is calcified. There is severe calcifcation of the  aortic valve. There is severe thickening of the aortic valve. Aortic valve  regurgitation is mild. Severe aortic valve stenosis. Aortic regurgitation  PHT measures 485 msec. Aortic  valve area, by VTI measures 0.74 cm. Aortic valve mean gradient measures  42.0 mmHg. Aortic valve Vmax measures 4.05 m/s.   8. The inferior vena cava is normal in size with greater than 50%  respiratory variability, suggesting right atrial pressure of 3 mmHg.   9. Compared to study dated 10/29/2021, the mean AVG has increased from  36.53mg to 454mg, DVI has decreased from 0.35 to 0.24, Vmax has increased  from 3.8856mto 4.68m42mnd AVA has decreased from 0.99cm2 to 0.74cm2 all  consistent with now severe AS.   Patient Profile     81 y53. male with PMH of paroxysmal A fib, tachy-brady syndrome, RBBB, s/p PPM implant 6/146/38/75plicated by microperforation of the atrial lead s/p PPM lead revision 01/16/22,  moderate /severe aortic stenosis, chronic diastolic heart failure, HTN, type 2 DM, hypothyroidism, HLD,  OSA on CPAP, who is admitted for hypotension, cardiology following since 01/29/22.   Seen by EP office 01/28/22 for PPM wound check, wound healed well, normal device function, RA/RV sensing, and impedances consistent with implant measurements. He was noted in A fib RVR with burden 99.8%. He had complained DOE, orthopnea. Device programmed at 3.5V/auto capture programmed on for extra safety margin until 3 month visit. He was started on lasix '20mg'$  daily and metoprolol '25mg'$  BID to help volume overload and tachycardia.   Labs  at admission with AKI Cr 1.51, transaminitis AST 303, ALT170, bilirubin WNL;albumin 3.1. BNP 377. Hs trop negative x2. Lactic acid 8.5>5.8 >3.7 >1.8. Procalcitonin <0.1. CBC diff with mild left shift. CXR with Mild bibasilar atelectasis or  infiltrate, small right pleural effusion. CTA torso without aortic dissection, Small bilateral pleural effusions.  Bibasilar atelectasis. Mild stranding/edema noted adjacent to the pancreatic head and pancreatic tail raising the possibility of early acute pancreatitis. CTH with Focal area of increased attenuation in the right parietal region, AVM vs mass vs infarct vs hemorrhage.   Assessment & Plan     Hypotension AKI  - presented with low BP 79/57 - Lab with AKI Cr 1.51 from baseline around 0.9 - ? multifactorial from severe AS + A fib RVR + initiation of lasix + metoprolol + ? allergy to BB+ ? sepsis + ? cardiac tamponade etiology  - BP is fluid responsive, lactic acid has normalized, continue IVF for now  - may consider insert PICC for COOX monitor to differentiate low output HF versus sepsis if BP not improving, further septic workup per internal medicine, current evidence of sepsis is lacking based on clinical exam and history  - OK to hold BB and diuretic for now   Paroxysmal A fib with RVR - rate is now controlled after IVF, has been RVR for the past 24-48 hours, possible sepsis /dehydration mediated/ workup pending, will check TSH - Echo showed LVEF 60-65%, no RWMA, grade II DD, elevated LVEDP, mildly reduced RV, moderately elevated  PASP with RVSP 47.3 mmHg, mild LAE and RAE, moderate pericardial effusion measures 1.51cm posteriorly at greatest diameter.Mild to moderate mitral valve  regurgitation. Severe calcifcation of the aortic valve with stenosis, VTI measures 0.74 cm, mean gradient measures 42.0 mmHg.Vmax measures 4.05 m/s - rate is controlled now spontaneously, not tolerating BB, would use diltiazem or amiodarone for rate/rhythm control in the  future  - keep K >4 and Mag >2   Moderate pericardial effusion - small pericardial effusion noted on CT 01/16/22 before lead revision - Echo today with moderate effusion, reviewed with Dr Harl Bowie, concerning for tamponade etiology  - continue IVF, monitor with interval Echo, will ask EP to see today to evaluate for possible PPM lead perforation today   S/P [employer] PPM implant 2/54/98  - complicated by microperforation of the atrial lead s/p PPM lead revision 01/16/22 - device interrogation 01/28/22 with normal device function - will ask EP to see today to evaluate for possible lead perforation  Severe aortic stenosis  - Echo compared to study dated 10/29/2021, the mean AVG has increased from 36.54mg to 437mg, DVI has decreased from 0.35 to 0.24, Vmax has increased from 3.8829mto 4.46m52mnd AVA has decreased from 0.99cm2 to 0.74cm2 all consistent with now severe AS.  - Avoid hypotension and tachycardia  - Structural heart team to see outpatient   Acute on chronic diastolic heart failure  - DOE with orthopnea improved after taking Lasix outpatient - BNP 377 - Echo as above  - unable to diuresis due to hypotension  - may consider insert PICC for COOX monitor to differentiate low output HF versus sepsis if BP not improving    Type 2 DM Hypothyroidism HLD OSA on CPAP - per IM   For questions or updates, please contact [email or web address] Please consult [email or web address] for contact info under        Signed, XikaMargie Billet  01/29/2022, 10:37 AM

## 2022-01-29 NOTE — Plan of Care (Signed)
  Problem: Education: Goal: Ability to describe self-care measures that may prevent or decrease complications (Diabetes Survival Skills Education) will improve Outcome: Progressing Goal: Individualized Educational Video(s) Outcome: Progressing   Problem: Coping: Goal: Ability to adjust to condition or change in health will improve Outcome: Progressing   

## 2022-01-29 NOTE — Consult Note (Signed)
Cardiology Consultation:   Patient ID: Edward Butler MRN: [id number]; DOB: November 28, 1940  Admit date: 01/28/2022 Date of Consult: 01/29/2022  PCP:  Elsie Stain, MD   [facility] Providers Cardiologist:  Dina Rich, MD  Electrophysiologist:  Virl Axe, MD       Patient Profile:   Edward Butler is a 81 y.o. male with a hx of  DM 2, HTN, CVA, atrial fibrillation, pacemaker implant with lead revision/repair on 01/16/2022 who is being seen 01/29/2022 for the evaluation of hypotension at the request of Dr. Velia Meyer.   History of Present Illness:   Edward Butler is a 81 y.o. male with a hx of  DM 2, HTN, CVA, atrial fibrillation, pacemaker implant with lead revision/repair on 01/16/2022 who is being seen 01/29/2022 for the evaluation of hypotension at the request of Dr. Velia Meyer. He was in the cardiology clinic this morning where he stated he had been having SOB so Lasix and metoprolol was prescribed. He suddenly started feeling weak after he took the medication and was clammy and pale upon EMS arrival. His SBP was in 70s; he was started on pressors and fluid was given. He felt better after that. Initial lactate was 8 which later improved to 5. After fluids, he felt much better with SBP in 100s; he denied any CP and thought he was back to baseline. No significant edema. He has a history of paroxysmal and perhaps persistent atrial fibrillation with rapid ventricular rates with rate control complicated by bradycardia prompting pacemaker implantation. Currently his HR is 60. He also has a hx of moderate to severe AS. CT chest/abdomen/pelvis was done in the ED which showed possible ?pancreatitis. No PE; CT brain showed Focal area of increased attenuation in the right parietal region. Cardiology was consulted for hypotension.    Past Medical History:  Diagnosis Date   Aneurysm ([place])    Atrial fibrillation ([place])    CAP (community acquired pneumonia) 10/20/2021   Diabetes mellitus without complication  ([place])    Hypertension    Non-recurrent acute suppurative otitis media of right ear without spontaneous rupture of tympanic membrane 07/04/2021   Stroke [facility])     Past Surgical History:  Procedure Laterality Date   LEAD REVISION/REPAIR N/A 01/16/2022   Procedure: LEAD REVISION/REPAIR;  Surgeon: Evans Lance, MD;  Location: [place] [facility];  Service: Cardiovascular;  Laterality: N/A;   PACEMAKER IMPLANT N/A 01/14/2022   Procedure: PACEMAKER IMPLANT;  Surgeon: Deboraha Sprang, MD;  Location: [place] [facility];  Service: Cardiovascular;  Laterality: N/A;   skin cancer surgeries          Inpatient Medications: Scheduled Meds:  insulin aspart  0-9 Units Subcutaneous TID WC   Continuous Infusions:  sodium chloride Stopped (01/29/22 0110)   lactated ringers 50 mL/hr at 01/29/22 0114   PRN Meds: acetaminophen **OR** acetaminophen  Allergies:   No Known Allergies  Social History:   Social History   Socioeconomic History   Marital status: Married    Spouse name: Not on file   Number of children: Not on file   Years of education: Not on file   Highest education level: Not on file  Occupational History   Not on file  Tobacco Use   Smoking status: Never   Smokeless tobacco: Never  Vaping Use   Vaping Use: Never used  Substance and Sexual Activity   Alcohol use: Not Currently   Drug use: Not Currently   Sexual activity: Not Currently  Other Topics Concern  Not on file  Social History Narrative   Not on file   Social Determinants of Health   Financial Resource Strain: Not on file  Food Insecurity: Not on file  Transportation Needs: Not on file  Physical Activity: Not on file  Stress: Not on file  Social Connections: Not on file  Intimate Partner Violence: Not on file    Family History:   No Hx of sudden cardiac death  ROS:  Please see the history of present illness.  All other ROS reviewed and negative.     Physical Exam/Data:   Vitals:   01/28/22 2218  01/28/22 2258 01/29/22 0000 01/29/22 0100  BP:  (!) 114/96 104/85 103/79  Pulse:  60 (!) 59 63  Resp:  19 (!) 27 (!) 32  Temp: (!) 97.2 F (36.2 C)     TempSrc:      SpO2:  96% 98% 100%    Intake/Output Summary (Last 24 hours) at 01/29/2022 0122 Last data filed at 01/28/2022 2054 Gross per 24 hour  Intake 1000 ml  Output --  Net 1000 ml      01/14/2022    6:38 AM 11/21/2021    9:20 AM 11/20/2021   10:21 AM  Last 3 Weights  Weight (lbs) 160 lb 158 lb 156 lb 6.4 oz  Weight (kg) 72.576 kg 71.668 kg 70.943 kg     There is no height or weight on file to calculate BMI.  General:  Well nourished, well developed, in mild distress HEENT: normal Neck: no JVD Vascular: No carotid bruits; Distal pulses 2+ bilaterally Cardiac:  normal S1, S2; RRR; no murmur  Lungs: Poor airflow Abd: soft, nontender, no hepatomegaly  Ext: no edema Musculoskeletal:  No deformities, BUE and BLE strength normal and equal Skin: cold.  Neuro:  CNs 2-12 intact, no focal abnormalities noted Psych:  Normal affect   EKG:  The EKG was personally reviewed and demonstrates: Paced rhythm.    Relevant CV Studies:   Echo 2023:  IMPRESSIONS     1. The aortic valve is abnormal. There is severe calcifcation of the  aortic valve. Aortic valve regurgitation is mild. Moderate to severe  aortic valve stenosis. Aortic valve area, by VTI measures 0.99 cm. Aortic  valve mean gradient measures 36.5 mmHg.  Aortic valve Vmax measures 3.89 m/s.   2. Left ventricular ejection fraction, by estimation, is 65 to 70%. The  left ventricle has normal function. The left ventricle has no regional  wall motion abnormalities. There is moderate asymmetric left ventricular  hypertrophy of the basal-septal  segment. Left ventricular diastolic parameters are consistent with Grade I  diastolic dysfunction (impaired relaxation).   3. Right ventricular systolic function is normal. The right ventricular  size is normal. There is normal  pulmonary artery systolic pressure. The  estimated right ventricular systolic pressure is 27.2 mmHg.   4. Left atrial size was mild to moderately dilated.   5. The mitral valve is degenerative. Mild mitral valve regurgitation. No  evidence of mitral stenosis.   6. The inferior vena cava is normal in size with greater than 50%  respiratory variability, suggesting right atrial pressure of 3 mmHg.   Laboratory Data:  High Sensitivity Troponin:   Recent Labs  Lab 01/16/22 0659 01/16/22 0942 01/28/22 1856 01/28/22 2025  TROPONINIHS 23* 19* 14 13     Chemistry Recent Labs  Lab 01/28/22 1856 01/28/22 1905  NA 137 135  K 5.8* 4.9  CL 106 107  CO2 15*  --  GLUCOSE 303* 285*  BUN 22 24*  CREATININE 1.51* 1.40*  CALCIUM 9.2  --   GFRNONAA 46*  --   ANIONGAP 16*  --     Recent Labs  Lab 01/28/22 1856  PROT 6.4*  ALBUMIN 3.5  AST 34  ALT 12  ALKPHOS 52  BILITOT 1.5*   Lipids No results for input(s): "CHOL", "TRIG", "HDL", "LABVLDL", "LDLCALC", "CHOLHDL" in the last 168 hours.  Hematology Recent Labs  Lab 01/28/22 1856 01/28/22 1905  WBC 11.8*  --   RBC 4.68  --   HGB 14.2 13.9  HCT 44.3 41.0  MCV 94.7  --   MCH 30.3  --   MCHC 32.1  --   RDW 14.0  --   PLT 408*  --    Thyroid No results for input(s): "TSH", "FREET4" in the last 168 hours.  BNP Recent Labs  Lab 01/28/22 2025  BNP 377.5*    DDimer No results for input(s): "DDIMER" in the last 168 hours.   Radiology/Studies:  CT HEAD WO CONTRAST (5MM)  Result Date: 01/28/2022 CLINICAL DATA:  Acute neurological deficit.  Stroke suspected. EXAM: CT HEAD WITHOUT CONTRAST TECHNIQUE: Contiguous axial images were obtained from the base of the skull through the vertex without intravenous contrast. RADIATION DOSE REDUCTION: This exam was performed according to the departmental dose-optimization program which includes automated exposure control, adjustment of the mA and/or kV according to patient size and/or use of  iterative reconstruction technique. COMPARISON:  None Available. FINDINGS: Brain: Note that patient received contrast material for a CTA chest earlier this evening, severely limiting the ability to detect intracranial hemorrhage. There is mild diffuse cerebral atrophy. Low-attenuation changes in the deep white matter consistent with small vessel ischemia. 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 There is a focal area of increased density in the right parietal lobe extending to the cortical surface. This is probably an area of contrast enhancement, possibly representing a venous malformation, but this could also represent an area of infarct or acute intraparenchymal hemorrhage. Enhancing tumor is less likely. Consider MRI versus repeat CT in 24 hours after contrast material has washed out. No mass effect or midline shift. No abnormal extra-axial fluid collections. Gray-white matter junctions are distinct. Vascular: Vascular calcifications.  No aneurysm identified. Skull: Calvarium appears intact. Sinuses/Orbits: Paranasal sinuses and mastoid air cells are clear. Other: None. IMPRESSION: 1. Examination is technically limited due to residual IV contrast material from prior study. 2. Focal area of increased attenuation in the right parietal region. Differential diagnosis includes contrast enhancement in a AVM or less likely mass versus infarct or hemorrhage. Suggest MRI or repeat CT after contrast material has washed out. 3. Chronic atrophy and small vessel ischemic changes. Electronically Signed   By: Lucienne Capers M.D.   On: 01/28/2022 22:45   CT Angio Chest/Abd/Pel for Dissection W and/or Wo Contrast  Result Date: 01/28/2022 CLINICAL DATA:  Acute aortic syndrome (AAS) suspected. Shortness of breath. EXAM: CT ANGIOGRAPHY CHEST, ABDOMEN AND PELVIS TECHNIQUE: Non-contrast CT of the chest was initially obtained. Multidetector CT imaging through the chest, abdomen and pelvis was performed using the standard  protocol during bolus administration of intravenous contrast. Multiplanar reconstructed images and MIPs were obtained and reviewed to evaluate the vascular anatomy. RADIATION DOSE REDUCTION: This exam was performed according to the departmental dose-optimization program which includes automated exposure control, adjustment of the mA and/or kV according to patient size and/or use of iterative reconstruction technique. CONTRAST:  256m OMNIPAQUE IOHEXOL 350  MG/ML SOLN COMPARISON:  01/16/2022 FINDINGS: CTA CHEST FINDINGS Cardiovascular: Heart is normal size. Small to moderate pericardial effusion, stable since prior study. Extensive coronary artery calcifications. Moderate aortic calcifications. No evidence of aortic aneurysm or dissection. Mediastinum/Nodes: No mediastinal, hilar, or axillary adenopathy. Trachea and esophagus are unremarkable. Thyroid unremarkable. Lungs/Pleura: Small bilateral pleural effusions, new since prior study. Bibasilar atelectasis. Musculoskeletal: Left chest wall pacer noted. Chest wall soft tissues are unremarkable. No acute bony abnormality. Review of the MIP images confirms the above findings. CTA ABDOMEN AND PELVIS FINDINGS VASCULAR Aorta: Diffuse aortic atherosclerosis. No evidence of aortic aneurysm or dissection. Celiac: Widely patent. SMA: Widely patent Renals: Single bilaterally, widely patent IMA: Widely patent Inflow: Atherosclerotic calcifications diffusely. No aneurysm or dissection. Veins: No obvious venous abnormality within the limitations of this arterial phase study. Review of the MIP images confirms the above findings. NON-VASCULAR Hepatobiliary: Prior cholecystectomy.  No focal hepatic abnormality. Pancreas: No focal abnormality or ductal dilatation. There is mild stranding seen adjacent to the pancreatic head and in the adjacent porta hepatis and adjacent to the descending duodenum. Also mild stranding adjacent to the pancreatic tail. This could reflect early acute  pancreatitis. Recommend clinical correlation. Spleen: No focal abnormality.  Normal size. Adrenals/Urinary Tract: Adrenal glands unremarkable. Bilateral small renal cysts, appear benign. No follow-up imaging recommended. No hydronephrosis. Urinary bladder decompressed. Stomach/Bowel: Left colonic diverticulosis. No active diverticulitis. Stomach and small bowel decompressed, grossly unremarkable. Lymphatic: No adenopathy Reproductive: No visible focal abnormality. Other: No free fluid or free air. Bilateral inguinal hernias containing fat, left larger than right. Musculoskeletal: No acute bony abnormality. Review of the MIP images confirms the above findings. IMPRESSION: Aortic atherosclerosis. No evidence of aortic aneurysm or dissection. Small bilateral pleural effusions.  Bibasilar atelectasis. Mild stranding/edema noted adjacent to the pancreatic head and pancreatic tail raising the possibility of early acute pancreatitis. Recommend clinical correlation. Left colonic diverticulosis. Electronically Signed   By: Rolm Baptise M.D.   On: 01/28/2022 20:31   DG Chest Port 1 View  Result Date: 01/28/2022 CLINICAL DATA:  Presyncope EXAM: PORTABLE CHEST 1 VIEW COMPARISON:  None Available. FINDINGS: Mild bibasilar atelectasis or infiltrate. Small right pleural effusion is present. No pneumothorax. Cardiomegaly is stable. 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 Pulmonary vascularity is normal. No acute bone abnormality. IMPRESSION: 1. Mild bibasilar atelectasis or infiltrate. Small right pleural effusion. 2. Stable cardiomegaly. Electronically Signed   By: Fidela Salisbury M.D.   On: 01/28/2022 19:47   CUP PACEART INCLINIC DEVICE CHECK  Result Date: 01/28/2022 Wound check appointment. Steri-strips removed. Wound without redness or edema. Incision edges approximated, wound well healed. Normal device function. RA/RV sensing, and impedances consistent with implant measurements. Unable to run thresholds  secondary to RVR. Burden 99.8% with current RVR episode ongoing from 1 day, 10 hours ago. Patient symptomatic complaining of inability to take a deep breath. +OAC. Dr. Caryl Comes in to assess patient and patient added to Dr. Aquilla Hacker schedule today. Device programmed at 3.5V/auto capture programmed on for extra safety margin until 3 month visit. Histogram distribution appropriate for patient and level of activity.    Assessment and Plan:   # Afib # Recent PPM Placement # HFpEF # Aortic Stenosis # Hypotension  -Seems like hypotension was mediated by metoprolol and lasix. Unclear if anaphylactic reaction or decreased preload in the setting of AS or other infectious process causing it -Echo in AM -Hold lasix and beta blocker -Telemetry -Agree with infectious work up including blood cultures -Maintaining MAP >65 -Device  interrogation and programming in AM     For questions or updates, please contact [place] Please consult [email or web address] for contact info under    Signed, Jaci Lazier, MD  01/29/2022 1:22 AM

## 2022-01-29 NOTE — Assessment & Plan Note (Deleted)
 #)   Type 2 Diabetes Mellitus: documented history of such. Home insulin regimen: None. Home oral hypoglycemic agents: Metformin.  Appears well controlled as outpatient, most recent hemoglobin A1c noted to be 5.9% in April 2023.     Plan: accuchecks QAC and HS with low dose SSI. hold home oral hypoglycemic agents during this hospitalization.

## 2022-01-29 NOTE — Plan of Care (Signed)
  Problem: Coping: Goal: Ability to adjust to condition or change in health will improve Outcome: Progressing   Problem: Fluid Volume: Goal: Ability to maintain a balanced intake and output will improve Outcome: Progressing   

## 2022-01-29 NOTE — Assessment & Plan Note (Deleted)
 #)   Paroxysmal atrial fibrillation: Documented history of such. In setting of CHA2DS2-VASc score of  4 , there is an indication for chronic anticoagulation for thromboembolic prophylaxis. Consistent with this, patient is chronically anticoagulated on  eliquis. Home AV nodal blocking regimen: verapamil.  Complicated by sick sinus syndrome status post recent pacemaker placement with pacer lead revision on 01/16/2022.  Appears to be in sinus rhythm at this time with EKG revealing paced rhythm, as further detailed above.    Plan: monitor strict I's & O's and daily weights. Repeat BMP/CBC in AM. Check serum mag level.  Setting of presenting hypotension, will hold home verapamil, will not administer any additional doses of beta-blocker this time.  Continue home Eliquis.  Monitor on telemetry.  Cardiology consulted, as further detailed above, with request for complete echocardiogram, and additional recommendations pending at this time.  Echocardiogram ordered for the morning.

## 2022-01-29 NOTE — Progress Notes (Signed)
   01/29/22 0409  Vitals  Temp 98.3 F (36.8 C)  Temp Source Oral (Simultaneous filing. User may not have seen previous data.)  BP (!) 120/52  MAP (mmHg) 72  BP Location Right Arm  BP Method Automatic  Patient Position (if appropriate) Lying  Pulse Rate 69  Pulse Rate Source Monitor  ECG Heart Rate 68  Resp (!) 30  Level of Consciousness  Level of Consciousness Alert  MEWS COLOR  MEWS Score Color Yellow  Oxygen Therapy  SpO2 98 %  O2 Device Nasal Cannula  O2 Flow Rate (L/min) 2 L/min  Pain Assessment  Pain Scale 0-10  Pain Score 0  Height and Weight  Height '5\' 6"'$  (1.676 m)  Weight 74.5 kg  BSA (Calculated - sq m) 1.86 sq meters  BMI (Calculated) 26.52  Weight in (lb) to have BMI = 25 154.6  ECG Monitoring  Cardiac Rhythm Atrial paced  Glasgow Coma Scale  Eye Opening 4  Best Verbal Response (NON-intubated) 5  Best Motor Response 6  Glasgow Coma Scale Score 15  MEWS Score  MEWS Temp 0  MEWS Systolic 0  MEWS Pulse 0  MEWS RR 2  MEWS LOC 0  MEWS Score 2     Patient arrived to the unit, comfort afforded. Denies any pain, will continue to monitor.

## 2022-01-29 NOTE — Assessment & Plan Note (Deleted)
 #)   Hyperlipidemia: documented h/o such. On simvastatin as outpatient.    Plan: continue home statin.

## 2022-01-29 NOTE — Hospital Course (Addendum)
Past medical history of PAF/SSS SP PPM implant on 5/04, complicated by microperforation with lead revision on 6/16, HTN, type II DM, moderate aortic stenosis, HLD, hypothyroidism. Recently has undergone pacemaker placement followed by lead revision due to microperforation complication.  On follow-up visit found to have A-fib and was started on metoprolol outpatient.  After 2 doses of metoprolol as well as Lasix he started having diaphoresis lightheadedness and decided to come to the [facility] further work-up.  Found to have hypovolemic shock treated with IV fluids. Cardiology following.  Echocardiogram shows moderate pericardial effusion. 6/30 patient developed A-fib with RVR and started on amiodarone drip.  Repeat echocardiogram shows worsening pericardial effusion.  Patient underwent pericardiocentesis with removal of 510 mL of bloody fluid.  Currently transferred to ICU under cardiology service. [facility] hospitalist will continue to follow as a consult for medical management.

## 2022-01-30 ENCOUNTER — Inpatient Hospital Stay (HOSPITAL_COMMUNITY): Payer: Medicare HMO

## 2022-01-30 ENCOUNTER — Encounter (HOSPITAL_COMMUNITY)
Admission: EM | Disposition: A | Discharge status: Home / Self Care | Payer: Self-pay | Source: Home / Self Care | Attending: Internal Medicine

## 2022-01-30 DIAGNOSIS — I314 Cardiac tamponade: Secondary | ICD-10-CM | POA: Diagnosis present

## 2022-01-30 DIAGNOSIS — I3139 Other pericardial effusion (noninflammatory): Secondary | ICD-10-CM

## 2022-01-30 DIAGNOSIS — I998 Other disorder of circulatory system: Secondary | ICD-10-CM

## 2022-01-30 DIAGNOSIS — I350 Nonrheumatic aortic (valve) stenosis: Secondary | ICD-10-CM

## 2022-01-30 DIAGNOSIS — R571 Hypovolemic shock: Secondary | ICD-10-CM | POA: Diagnosis not present

## 2022-01-30 HISTORY — PX: PERICARDIOCENTESIS: CATH118255

## 2022-01-30 LAB — COMPREHENSIVE METABOLIC PANEL
ALT: 124 U/L — ABNORMAL HIGH (ref 0–44)
AST: 108 U/L — ABNORMAL HIGH (ref 15–41)
Albumin: 2.8 g/dL — ABNORMAL LOW (ref 3.5–5.0)
Alkaline Phosphatase: 54 U/L (ref 38–126)
Anion gap: 4 — ABNORMAL LOW (ref 5–15)
BUN: 18 mg/dL (ref 8–23)
CO2: 20 mmol/L — ABNORMAL LOW (ref 22–32)
Calcium: 8 mg/dL — ABNORMAL LOW (ref 8.9–10.3)
Chloride: 114 mmol/L — ABNORMAL HIGH (ref 98–111)
Creatinine, Ser: 1.09 mg/dL (ref 0.61–1.24)
GFR, Estimated: >60 mL/min (ref >60)
Glucose, Bld: 192 mg/dL — ABNORMAL HIGH (ref 70–99)
Potassium: 3.7 mmol/L (ref 3.5–5.1)
Sodium: 138 mmol/L (ref 135–145)
Total Bilirubin: 0.6 mg/dL (ref 0.3–1.2)
Total Protein: 5.4 g/dL — ABNORMAL LOW (ref 6.5–8.1)

## 2022-01-30 LAB — MAGNESIUM: Magnesium: 1.8 mg/dL (ref 1.7–2.4)

## 2022-01-30 LAB — CBC WITH DIFFERENTIAL/PLATELET
Abs Immature Granulocytes: 0.05 10*3/uL (ref 0.00–0.07)
Basophils Absolute: 0.1 10*3/uL (ref 0.0–0.1)
Basophils Relative: 1 %
Eosinophils Absolute: 0.3 10*3/uL (ref 0.0–0.5)
Eosinophils Relative: 3 %
HCT: 38.1 % — ABNORMAL LOW (ref 39.0–52.0)
Hemoglobin: 12.6 g/dL — ABNORMAL LOW (ref 13.0–17.0)
Immature Granulocytes: 1 %
Lymphocytes Relative: 18 %
Lymphs Abs: 1.5 10*3/uL (ref 0.7–4.0)
MCH: 30.4 pg (ref 26.0–34.0)
MCHC: 33.1 g/dL (ref 30.0–36.0)
MCV: 91.8 fL (ref 80.0–100.0)
Monocytes Absolute: 0.6 10*3/uL (ref 0.1–1.0)
Monocytes Relative: 7 %
Neutro Abs: 5.8 10*3/uL (ref 1.7–7.7)
Neutrophils Relative %: 70 %
Platelets: 238 10*3/uL (ref 150–400)
RBC: 4.15 MIL/uL — ABNORMAL LOW (ref 4.22–5.81)
RDW: 13.9 % (ref 11.5–15.5)
WBC: 8.3 10*3/uL (ref 4.0–10.5)
nRBC: 0 % (ref 0.0–0.2)

## 2022-01-30 LAB — GRAM STAIN

## 2022-01-30 LAB — BODY FLUID CELL COUNT WITH DIFFERENTIAL
Eos, Fluid: 0 %
Lymphs, Fluid: 81 %
Monocyte-Macrophage-Serous Fluid: 6 % — ABNORMAL LOW (ref 50–90)
Neutrophil Count, Fluid: 13 % (ref 0–25)
Total Nucleated Cell Count, Fluid: 2310 cu mm — ABNORMAL HIGH (ref 0–1000)

## 2022-01-30 LAB — ECHOCARDIOGRAM LIMITED
Height: 66 in
Height: 66 in
Weight: 2641.99 [oz_av]
Weight: 2641.99 oz

## 2022-01-30 LAB — GLUCOSE, CAPILLARY
Glucose-Capillary: 173 mg/dL — ABNORMAL HIGH (ref 70–99)
Glucose-Capillary: 229 mg/dL — ABNORMAL HIGH (ref 70–99)
Glucose-Capillary: 127 mg/dL — ABNORMAL HIGH (ref 70–99)
Glucose-Capillary: 193 mg/dL — ABNORMAL HIGH (ref 70–99)

## 2022-01-30 SURGERY — PERICARDIOCENTESIS
Anesthesia: LOCAL

## 2022-01-30 MED ORDER — MELATONIN 5 MG PO TABS
10.0000 mg | ORAL_TABLET | Freq: Every evening | ORAL | Status: DC | PRN
  Administered 2022-01-31: 10 mg via ORAL
  Filled 2022-01-30: qty 2

## 2022-01-30 MED ORDER — LIDOCAINE HCL (PF) 1 % IJ SOLN
INTRAMUSCULAR | Status: DC | PRN
  Administered 2022-01-30: 10 mL via SUBCUTANEOUS

## 2022-01-30 MED ORDER — FUROSEMIDE 10 MG/ML IJ SOLN
20.0000 mg | Freq: Once | INTRAMUSCULAR | Status: AC
  Administered 2022-01-30: 20 mg via INTRAVENOUS
  Filled 2022-01-30: qty 2

## 2022-01-30 MED ORDER — LIDOCAINE HCL (PF) 1 % IJ SOLN
INTRAMUSCULAR | Status: AC
Start: 2022-01-30 — End: ?
  Filled 2022-01-30: qty 30

## 2022-01-30 MED ORDER — AMIODARONE LOAD VIA INFUSION
150.0000 mg | Freq: Once | INTRAVENOUS | Status: AC
  Administered 2022-01-30: 150 mg via INTRAVENOUS
  Filled 2022-01-30: qty 83.34

## 2022-01-30 MED ORDER — SODIUM CHLORIDE 0.9 % IV SOLN: INTRAVENOUS | Status: DC

## 2022-01-30 MED ORDER — ORAL CARE MOUTH RINSE: 15.0000 mL | OROMUCOSAL | Status: DC | PRN

## 2022-01-30 MED ORDER — SODIUM CHLORIDE 0.9% FLUSH
3.0000 mL | Freq: Two times a day (BID) | INTRAVENOUS | Status: DC
  Administered 2022-01-30 – 2022-02-04 (×9): 3 mL via INTRAVENOUS

## 2022-01-30 MED ORDER — SODIUM CHLORIDE 0.9% FLUSH: 3.0000 mL | INTRAVENOUS | Status: DC | PRN

## 2022-01-30 MED ORDER — SODIUM CHLORIDE 0.9% FLUSH
3.0000 mL | Freq: Two times a day (BID) | INTRAVENOUS | Status: DC
  Administered 2022-01-30 – 2022-02-04 (×9): 3 mL via INTRAVENOUS

## 2022-01-30 MED ORDER — CHLORHEXIDINE GLUCONATE CLOTH 2 % EX PADS
6.0000 | MEDICATED_PAD | Freq: Every day | CUTANEOUS | Status: DC
Start: 2022-01-30 — End: 2022-02-04
  Administered 2022-01-30 – 2022-02-03 (×5): 6 via TOPICAL

## 2022-01-30 MED ORDER — AMIODARONE HCL IN DEXTROSE 360-4.14 MG/200ML-% IV SOLN
60.0000 mg/h | INTRAVENOUS | Status: AC
  Administered 2022-01-30 (×2): 60 mg/h via INTRAVENOUS
  Filled 2022-01-30 (×2): qty 200

## 2022-01-30 MED ORDER — METOPROLOL TARTRATE 5 MG/5ML IV SOLN: 5.0000 mg | Freq: Once | INTRAVENOUS | Status: DC

## 2022-01-30 MED ORDER — SODIUM CHLORIDE 0.9 % IV SOLN: 250.0000 mL | INTRAVENOUS | Status: DC | PRN

## 2022-01-30 MED ORDER — HEPARIN (PORCINE) IN NACL 1000-0.9 UT/500ML-% IV SOLN
INTRAVENOUS | Status: DC | PRN
  Administered 2022-01-30: 500 mL

## 2022-01-30 MED ORDER — ALBUTEROL SULFATE (2.5 MG/3ML) 0.083% IN NEBU
2.5000 mg | INHALATION_SOLUTION | RESPIRATORY_TRACT | Status: DC | PRN
Start: 2022-01-30 — End: 2022-01-31
  Administered 2022-01-30: 2.5 mg via RESPIRATORY_TRACT
  Filled 2022-01-30: qty 3

## 2022-01-30 MED ORDER — AMIODARONE HCL IN DEXTROSE 360-4.14 MG/200ML-% IV SOLN
30.0000 mg/h | INTRAVENOUS | Status: DC
  Administered 2022-01-30 – 2022-02-02 (×8): 30 mg/h via INTRAVENOUS
  Filled 2022-01-30 (×7): qty 200

## 2022-01-30 MED ORDER — METOPROLOL TARTRATE 12.5 MG HALF TABLET: 12.5000 mg | ORAL_TABLET | Freq: Two times a day (BID) | ORAL | Status: DC

## 2022-01-30 SURGICAL SUPPLY — 5 items
PACK CARDIAC CATHETERIZATION (CUSTOM PROCEDURE TRAY) ×1 IMPLANT
PROTECTION STATION PRESSURIZED (MISCELLANEOUS) ×2
STATION PROTECTION PRESSURIZED (MISCELLANEOUS) IMPLANT
TRAY PERICARDIOCENTESIS 6FX60 (TRAY / TRAY PROCEDURE) ×1 IMPLANT
WIRE MICRO SET 5FR 12 (WIRE) ×1 IMPLANT

## 2022-01-30 NOTE — Progress Notes (Signed)
Patient was found to be in Afib W/RVR, BP stable, denies any CP.      [facility] and Cardiology paged. See new orders.

## 2022-01-30 NOTE — Progress Notes (Signed)
  Echocardiogram 2D Echocardiogram has been performed.  Edward Butler 01/30/2022, 4:30 PM

## 2022-01-30 NOTE — Consult Note (Addendum)
[name]  Cardiology Consultation:   Patient ID: Edward Butler MRN: [id number]; DOB: 11/21/1940  Admit date: 01/28/2022 Date of Consult: 01/30/2022  Primary Care Provider: Elsie Stain, MD [facility] Cardiologist: Dr. Marlou Porch, MD  [facility] Electrophysiologist:  Virl Axe, MD   Patient Profile:   Edward Butler is a 81 y.o. male with a hx of paroxysmal atrial fibrillation on Eliquis, SSS s/p PPM placement with recent lead revision 01/2022 followed by Dr. Caryl Comes, CVA, OSA on CPAP, HTN, DM2, hypothyroidism, HLD, and progressive aortic stenosis  who is being seen today for the evaluation of severe AS at the request of Dr. Harl Bowie.  History of Present Illness:   Edward Butler is an 81yo M who lives in [place] with one of his daughters and her husband. He has a second daughter who lives near [facility] who is here today with him. 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 He retired completley from there 2 years ago at the age of 81yo. Until January of this year he was walking at least 4 times per week for exercise. He states that his health took a down turn around 10/2021 when he began having issues uncontrolled AF rates. He follows with a dentist on a regular basis and has no acute dental issues.   Edward Butler was previously followed by Dr. Salvadore Oxford with [facility] cardiology for his PAF. He was seen 07/2021 and seemed to be doing well from a CV standpoint. Echocardiogram from 03/27/2021 showed normal LVEF at 55-60% with moderate to severe aortic stenosis. He was admitted 10/2021 at [facility] for pneumonia and atrial fibrillation with RVR. Cardiology was consulted at which time he was treated with IV diltiazem with eventual conversion to NSR. He was discharged on PO diltiazem and metoprolol. He was then seen back in the ED 10/27/21 with  pre-syncope complaints. HR at that time was in the 30's, treated with atropine. He was admitted with symptomatic bradycardia and seen by cardiology. Diltiazem and metoprolol were stopped with HR improvement. Due to moderate/severe AS, echocardiogram was update at that time which showed normal LV function with severe aortic calcifcation with an AVA by VTI at 0.99 cm, mean gradient at 36.41mHg, peak at 60.552mg, DI at 0.35, and SVI at 47. He was placed on an event monitor which showed a 26% AF burden with HR's in the 140-150 range. To mitigate his rapid rates and issues with rate controlling agents with symptomatic bradycardia, plan was for PPM placement. He underwent PPM implant 01/14/22. This was complicated by post implant pericardial effusion which ultimately led to a lead revision with Dr. TaLovena Len 01/16/22. In follow up with Dr. KlCaryl Comese was started on PO Lasix x 3 days and metoprolol was added back to his regimen.   [facility]. He reported that after being seen in the office, he took his first dose of metoprolol and began to feel diaphoretic with associated dizziness. He went to the ED and was found to be hypotensive with an initial BP at 79/57. He was treated with IVF and pressors with improvement. HR's at that time were stable in the 60's however was also having issues with AF RVR. Symptoms felt possibly related to rapid rates and questionable cardiac tamponade along with severe AS. Repeat echo 6/29 showed normal LVEF at 60-65% with  G2DD, circumferential pericardial effusion measuring 1.51cm posteriorly, mild to mod MR, and severe aortic stenosis with mean gradient at 24mHg, peak at 35.644mg, AVA by VTI at 0.74cm2, DI at 0.24, and SVI at 41. Rates have remained rather difficult to control, therefore he has been placed on IV Amiodarone with symptom improvement. His hospital course has additionally been complicated by AKI and CHF. Repeat limited echo today  with increased effusion, especially in apical and subcostal views with concern for tamponade especially given recent Eliquis. Limited echo reviewed with Dr. CoBurt Knack  Today he reports more shortness of breath than previously noted. He denies chest pain, palpitations, LE edema, orthopnea, dizziness, or syncope.   Past Medical History:  Diagnosis Date   [place]neurysm ([facility]   Atrial fibrillation ([facility]   CAP (community acquired pneumonia) 10/20/2021   Diabetes mellitus without complication ([place]   Hypertension    Non-recurrent acute suppurative otitis media of right ear without spontaneous rupture of tympanic membrane 07/04/2021   [facility]    Past Surgical History:  Procedure Laterality Date   LEAD REVISION/REPAIR N/A 01/16/2022   Procedure: LEAD REVISION/REPAIR;  Surgeon: TaEvans LanceMD;  [facility];  Service: Cardiovascular;  Laterality: N/A;   PACEMAKER IMPLANT N/A 01/14/2022   Procedure: PACEMAKER IMPLANT;  Surgeon: KlDeboraha SprangMD;  [facility];  Service: Cardiovascular;  Laterality: N/A;   skin cancer surgeries        Home Medications:  Prior to Admission medications   Medication Sig Start Date End Date Taking? Authorizing Provider  acetaminophen (TYLENOL) 500 MG tablet Take 1,000 mg by mouth daily as needed for moderate pain.   Yes [provider]  apixaban (ELIQUIS) 5 MG TABS tablet Take 5 mg by mouth 2 (two) times daily. 07/13/19  Yes [provider]  Calcium Carbonate-Vitamin D (CALCIUM 600+D PO) Take 2 tablets by mouth daily.   Yes [provider]  cholecalciferol (VITAMIN D3) 25 MCG (1000 UNIT) tablet Take 1,000 Units by mouth daily.   Yes [provider]  enalapril (VASOTEC) 5 MG tablet Take 5 mg by mouth daily. 09/28/18  Yes [provider]  finasteride (PROSCAR) 5 MG tablet TAKE 1 TABLET EVERY DAY Patient taking differently: Take 5 mg by mouth daily. 09/19/21  Yes WrElsie StainMD   furosemide (LASIX) 20 MG tablet Take 1 tablet by mouth today and then daily for the next 3 days then stop. 01/28/22  Yes KlDeboraha SprangMD  levothyroxine (SYNTHROID) 25 MCG tablet Take 25 mcg by mouth daily before breakfast. 12/03/18  Yes [provider]  magnesium oxide (MAG-OX) 400 MG tablet Take 400 mg by mouth daily. 12/13/18  Yes [provider]  metFORMIN (GLUCOPHAGE-XR) 500 MG 24 hr tablet Take 2 tablets (1,000 mg total) by mouth 2 (two) times daily. 09/19/21  Yes WrElsie StainMD  metoprolol tartrate (LOPRESSOR) 25 MG tablet Take 1 tablet (25 mg total) by mouth 2 (two) times daily. 01/28/22  Yes KlDeboraha SprangMD  simvastatin (ZOCOR) 10 MG tablet Take 10 mg by mouth daily. 09/28/18  Yes [provider]  verapamil (CALAN-SR) 240 MG CR tablet Take 1 tablet (240 mg total) by mouth daily. 11/21/21  Yes KlDeboraha SprangMD  Accu-Chek FastClix Lancets MISC Use to check blood sugar once daily. E11.9 02/26/21   WrElsie StainMD  Blood Glucose Monitoring Suppl (ACCU-CHEK GUIDE ME) w/Device KIT USE AS DIRECTED 09/19/21  Elsie Stain, MD  glucose blood (ACCU-CHEK GUIDE) test strip USE TO CHECK BLOOD SUGAR ONCE DAILY. 09/19/21   Elsie Stain, MD   Inpatient Medications: Scheduled Meds:  insulin aspart  0-9 Units Subcutaneous TID WC   levothyroxine  25 mcg Oral Q0600   simvastatin  10 mg Oral Daily   sodium chloride flush  3 mL Intravenous Q12H   Continuous Infusions:  sodium chloride     [START ON 01/31/2022] sodium chloride     amiodarone 30 mg/hr (01/30/22 1355)   PRN Meds: sodium chloride, acetaminophen **OR** acetaminophen, albuterol, sodium chloride flush  Allergies:   No Known Allergies  Social History:   Social History   Socioeconomic History   Marital status: Married    Spouse name: Not on file   Number of children: Not on file   Years of education: Not on file   Highest education level: Not on file  Occupational History   Not on file   Tobacco Use   Smoking status: Never   Smokeless tobacco: Never  Vaping Use   Vaping Use: Never used  Substance and Sexual Activity   Alcohol use: Not Currently   Drug use: Not Currently   Sexual activity: Not Currently  Other Topics Concern   Not on file  Social History Narrative   Not on file   Social Determinants of Health   Financial Resource Strain: Not on file  Food Insecurity: Not on file  Transportation Needs: Not on file  Physical Activity: Not on file  Stress: Not on file  Social Connections: Not on file  Intimate Partner Violence: Not on file    Family History:   History reviewed. No pertinent family history.   ROS:  Please see the history of present illness.   All other ROS reviewed and negative.     Physical Exam/Data:   Vitals:   01/30/22 0800 01/30/22 1337 01/30/22 1400 01/30/22 1433  BP:  (!) 129/91    Pulse: (!) 122 (!) 120 (!) 51   Resp: (!) 26 (!) 25 (!) 33   Temp:  98.1 F (36.7 C)    TempSrc:      SpO2: 98%  99%   Weight:    74.9 kg  Height:        Intake/Output Summary (Last 24 hours) at 01/30/2022 1500 Last data filed at 01/30/2022 1200 Gross per 24 hour  Intake 2183.33 ml  Output 2430 ml  Net -246.67 ml      01/30/2022    2:33 PM 01/30/2022    4:54 AM 01/29/2022    4:09 AM  Last 3 Weights  Weight (lbs) 165 lb 2 oz 165 lb 2 oz 164 lb 3.9 oz  Weight (kg) 74.9 kg 74.9 kg 74.5 kg     Body mass index is 26.65 kg/m.   General: Elderly, mild SOB with communication Skin: Warm, dry, intact  Neck: Negative for carotid bruits. No JVD Lungs:Clear to ausculation bilaterally. No wheezes, rales, or rhonchi. Breathing is unlabored. Cardiovascular: Irregularly irregular. Systolic murmurs Abdomen: Soft, non-tender, non-distended. No obvious abdominal masses. Extremities: No edema.  Neuro: Alert and oriented. No focal deficits. No facial asymmetry. MAE spontaneously. Psych: Responds to questions appropriately with normal affect.    EKG:   The EKG was personally reviewed and demonstrates: AF with RVR, HR 148bpm  Telemetry:  Telemetry was personally reviewed and demonstrates: AF with HR's in the 120's   Relevant CV Studies:  Echocardiogram 01/29/22:   1. Left ventricular  ejection fraction, by estimation, is 60 to 65%. The  left ventricle has normal function. The left ventricle has no regional  wall motion abnormalities. There is mild concentric left ventricular  hypertrophy. Left ventricular diastolic  parameters are consistent with Grade II diastolic dysfunction  (pseudonormalization). Elevated left ventricular end-diastolic pressure.   2. Right ventricular systolic function is mildly reduced. The right  ventricular size is normal. There is moderately elevated pulmonary artery  systolic pressure. The estimated right ventricular systolic pressure is  09.6 mmHg.   3. Left atrial size was mildly dilated.   4. Right atrial size was mildly dilated.   5. Pericardial effusion measures 1.51cm posteriorly at greatest  diameter.. Moderate pericardial effusion. The pericardial effusion is  circumferential.   6. The mitral valve is degenerative. Mild to moderate mitral valve  regurgitation. No evidence of mitral stenosis. Moderate mitral annular  calcification.   7. The aortic valve is calcified. There is severe calcifcation of the  aortic valve. There is severe thickening of the aortic valve. Aortic valve  regurgitation is mild. Severe aortic valve stenosis. Aortic regurgitation  PHT measures 485 msec. Aortic  valve area, by VTI measures 0.74 cm. Aortic valve mean gradient measures  42.0 mmHg. Aortic valve Vmax measures 4.05 m/s.   8. The inferior vena cava is normal in size with greater than 50%  respiratory variability, suggesting right atrial pressure of 3 mmHg.   9. Compared to study dated 10/29/2021, the mean AVG has increased from  36.63mg to 49mg, DVI has decreased from 0.35 to 0.24, Vmax has increased  from 3.8813mto  4.59m69mnd AVA has decreased from 0.99cm2 to 0.74cm2 all  consistent with now severe AS.   Limited echocardiogram 01/30/22:  Limited echo but known moderate MR and severe AS. Left ventricular ejection fraction, by estimation, is 55 to 60%. The left ventricle has normal function. 2. 3. Pacing wires in RA/RV. Compared to TTE done yesterday effusion seems larger especially in apical and subcostal views Patient in rapid afib but appears to have more drop in mitral inflows with inspiration and RV diastolic collapse IVC remains dilated over 2 as before Would have concern for tamponade especially if on blood thinners for afib . Large pericardial effusion. The pericardial effusion is circumferential.  Echocardiogram 03/27/2021:  SUMMARY  The left ventricular size is normal.  Mild left ventricular hypertrophy  LV ejection fraction = 55-60%.  Left ventricular systolic function is normal.  The right ventricle is normal in size and function.  The left atrium is moderately dilated.  There is moderate to severe aortic stenosis.  There is moderate to severe mitral annular calcification.  There is mild mitral regurgitation.  The mean gradient across the mitral valve is 3.9 mmHg.  The heart rate for the mean mitral valve gradient is 70 BPM.  There was insufficient TR detected to calculate RV systolic pressure.  The inferior vena cava was not visualized during the exam.  There is no pericardial effusion.  There is no comparison study available     Doppler Measurements & Calculations  MV E max vel:      MV V2 max:       MV dec time: SV(LVOT): 99.3 ml  147.9 cm/sec       153.6 cm/sec     0.30 sec     Ao V2 max:  MV A max vel:      MV max PG:  417.7 cm/sec  142.1 cm/sec       9.4 mmHg                      Ao max PG: 69.8 mmHg  MV E/A: 1.0        MV V2 mean:                   Ao V2 mean:  Med Peak E' Vel:   91.9 cm/sec                   298.3 cm/sec  4.5 cm/sec         MV mean  PG:                   Ao mean PG: 39.5 mmHg  Lat Peak E' Vel:   3.9 mmHg                      Ao V2 VTI: 95.6 cm  4.6 cm/sec         MV V2 VTI:  E/Lat E`: 32.4     50.8 cm                       AVA (VTI): 1.0 cm2  E/Med E`: 33.2                     MVA(VTI): 2.0 cm2   Echocardiogram 10/29/21:   1. The aortic valve is abnormal. There is severe calcifcation of the  aortic valve. Aortic valve regurgitation is mild. Moderate to severe  aortic valve stenosis. Aortic valve area, by VTI measures 0.99 cm. Aortic  valve mean gradient measures 36.5 mmHg.  Aortic valve Vmax measures 3.89 m/s.   2. Left ventricular ejection fraction, by estimation, is 65 to 70%. The  left ventricle has normal function. The left ventricle has no regional  wall motion abnormalities. There is moderate asymmetric left ventricular  hypertrophy of the basal-septal  segment. Left ventricular diastolic parameters are consistent with Grade I  diastolic dysfunction (impaired relaxation).   3. Right ventricular systolic function is normal. The right ventricular  size is normal. There is normal pulmonary artery systolic pressure. The  estimated right ventricular systolic pressure is 41.6 mmHg.   4. Left atrial size was mild to moderately dilated.   5. The mitral valve is degenerative. Mild mitral valve regurgitation. No  evidence of mitral stenosis.   6. The inferior vena cava is normal in size with greater than 50%  respiratory variability, suggesting right atrial pressure of 3 mmHg.   Laboratory Data:  High Sensitivity Troponin:   Recent Labs  Lab 01/16/22 0659 01/16/22 0942 01/28/22 1856 01/28/22 2025  TROPONINIHS 23* 19* 14 13     Chemistry Recent Labs  Lab 01/28/22 1856 01/28/22 1905 01/29/22 0510 01/30/22 0055  NA 137 135 140 138  K 5.8* 4.9 4.5 3.7  CL 106 107 107 114*  CO2 15*  --  21* 20*  GLUCOSE 303* 285* 135* 192*  BUN 22 24* 22 18  CREATININE 1.51* 1.40* 1.50* 1.09  CALCIUM 9.2  --  8.9  8.0*  GFRNONAA 46*  --  46* >60  ANIONGAP 16*  --  12 4*    Recent Labs  Lab 01/28/22 1856 01/29/22 0510 01/30/22 0055  PROT 6.4* 6.0* 5.4*  ALBUMIN 3.5 3.1* 2.8*  AST 34 303* 108*  ALT 12 170* 124*  ALKPHOS 52 69 54  BILITOT 1.5* 0.9 0.6   Hematology Recent Labs  Lab 01/28/22 1856 01/28/22 1905 01/29/22 0510 01/30/22 0055  WBC 11.8*  --  9.6 8.3  RBC 4.68  --  4.36 4.15*  HGB 14.2 13.9 13.7 12.6*  HCT 44.3 41.0 38.9* 38.1*  MCV 94.7  --  89.2 91.8  MCH 30.3  --  31.4 30.4  MCHC 32.1  --  35.2 33.1  RDW 14.0  --  13.9 13.9  PLT 408*  --  287 238   BNP Recent Labs  Lab 01/28/22 2025  BNP 377.5*    DDimer No results for input(s): "DDIMER" in the last 168 hours.   Radiology/Studies:  VAS [place] ABI WITH/WO TBI  Result Date: 01/30/2022  LOWER EXTREMITY DOPPLER STUDY Patient Name:  Edward Butler  Date of Exam:   01/30/2022 Medical Rec #: [id number]      Accession #:    [id number] Date of Birth: Feb 16, 1941       Patient Gender: M Patient Age:   60 years Exam [facility] Procedure:      VAS [place] ABI WITH/WO TBI Referring Phys: Regan Lemming --------------------------------------------------------------------------------  Indications: Limb ischemia, left. High Risk Factors: Hypertension, Diabetes, no history of smoking.  Performing Technologist: Bobetta Lime BS RVT  Examination Guidelines: A complete evaluation includes at minimum, Doppler waveform signals and systolic blood pressure reading at the level of bilateral brachial, anterior tibial, and posterior tibial arteries, when vessel segments are accessible. Bilateral testing is considered an integral part of a complete examination. Photoelectric Plethysmograph (PPG) waveforms and toe systolic pressure readings are included as required and additional duplex testing as needed. Limited examinations for reoccurring indications may be performed as noted.  ABI Findings:  +---------+------------------+-----+---------+----------------+ Right    Rt Pressure (mmHg)IndexWaveform Comment          +---------+------------------+-----+---------+----------------+ Brachial 127                                              +---------+------------------+-----+---------+----------------+ PTA      255               1.93 triphasicNon-compressible +---------+------------------+-----+---------+----------------+ DP       255               1.93 triphasicNon-compressible +---------+------------------+-----+---------+----------------+ Great Toe113               0.86                           +---------+------------------+-----+---------+----------------+ +---------+------------------+-----+----------+-------+ Left     Lt Pressure (mmHg)IndexWaveform  Comment +---------+------------------+-----+----------+-------+ Brachial 132                                      +---------+------------------+-----+----------+-------+ PTA      88                0.67 monophasic        +---------+------------------+-----+----------+-------+ DP       89                0.67 monophasic        +---------+------------------+-----+----------+-------+ Great Toe0                 0.00 Absent            +---------+------------------+-----+----------+-------+ +-------+-----------+-----------+------------+------------+  ABI/TBIToday's ABIToday's TBIPrevious ABIPrevious TBI +-------+-----------+-----------+------------+------------+ Right  Carmi         0.86                                +-------+-----------+-----------+------------+------------+ Left   0.67       0                                   +-------+-----------+-----------+------------+------------+ The left great toe pressure is deferred due to absent waveform in the left great toe.  Summary: Right: Resting right ankle-brachial index indicates noncompressible right lower extremity arteries. The right  toe-brachial index is normal. Left: Resting left ankle-brachial index indicates moderate left lower extremity arterial disease. The left toe-brachial index is abnormal. *See table(s) above for measurements and observations.     Preliminary    ECHOCARDIOGRAM LIMITED  Result Date: 01/30/2022    ECHOCARDIOGRAM LIMITED REPORT   Patient Name:   Edward Butler Date of Exam: 01/30/2022 Medical Rec #:  [id number]     Height:       66.0 in Accession #:    [id number]    Weight:       165.1 lb Date of Birth:  January 24, 1941      BSA:          1.843 m Patient Age:    33 years      BP:           129/91 mmHg Patient Gender: M             HR:           120 bpm. Exam Location:  Inpatient Procedure: Limited Echo and Cardiac Doppler Indications:    Pericardial Effusion  History:        Patient has prior history of Echocardiogram examinations.                 Pacemaker, Aortic Valve Disease; Risk Factors:Hypertension,                 Diabetes and Dyslipidemia.  Sonographer:    Meagan Baucom RDCS, FE, PE Referring Phys: [place]  1. Limited echo but known moderate MR and severe AS.  2. Left ventricular ejection fraction, by estimation, is 55 to 60%. The left ventricle has normal function.  3. Pacing wires in RA/RV.  4. Compared to TTE done yesterday effusion seems larger especially in apical and subcostal views Patient in rapid afib but appears to have more drop in mitral inflows with inspiration and RV diastolic collapse IVC remains dilated over 2 as before Would have concern for tamponade especially if on blood thinners for afib . Large pericardial effusion. The pericardial effusion is circumferential. FINDINGS  Left Ventricle: Left ventricular ejection fraction, by estimation, is 55 to 60%. The left ventricle has normal function. The left ventricular internal cavity size was normal in size. Right Ventricle: Pacing wires in RA/RV. Pericardium: Compared to TTE done yesterday effusion seems larger especially in apical and  subcostal views Patient in rapid afib but appears to have more drop in mitral inflows with inspiration and RV diastolic collapse IVC remains dilated over 2 as before Would have concern for tamponade especially if on blood thinners for afib. A large pericardial effusion is present. The pericardial effusion is circumferential. Additional Comments: Limited echo but known moderate MR and severe AS. Collier Salina  Johnsie Cancel MD Electronically signed by Jenkins Rouge MD Signature Date/Time: 01/30/2022/11:44:17 AM    Final    CT HEAD WO CONTRAST (5MM)  Result Date: 01/30/2022 CLINICAL DATA:  Neurological deficit, suspected stroke. History atrial fibrillation, diabetes mellitus, hypertension, stroke EXAM: CT HEAD WITHOUT CONTRAST TECHNIQUE: Contiguous axial images were obtained from the base of the skull through the vertex without intravenous contrast. RADIATION DOSE REDUCTION: This exam was performed according to the departmental dose-optimization program which includes automated exposure control, adjustment of the mA and/or kV according to patient size and/or use of iterative reconstruction technique. COMPARISON:  01/28/2022 FINDINGS: Brain: Generalized atrophy. Normal ventricular morphology. No midline shift or mass effect. Small vessel chronic ischemic changes of deep cerebral white matter. Again identified infarct LEFT periventricular white matter. Area of high attenuation at the RIGHT parietal lobe on previous exam not identified on this con contrast study. No intracranial hemorrhage, mass lesion, or new area of infarction. No extra-axial fluid collections. Vascular: No hyperdense vessels. Atherosclerotic calcifications of internal carotid and vertebral arteries at skull base Skull: Intact Sinuses/Orbits: Clear Other: N/A IMPRESSION: Atrophy with small vessel chronic ischemic changes of deep cerebral white matter. 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 No acute intracranial abnormalities. Area of high attenuation at  RIGHT parietal lobe on previous exam not identified on current noncontrast study, likely represented enhancement of uncertain etiology. Electronically Signed   By: Lavonia Dana M.D.   On: 01/30/2022 08:42   DG CHEST PORT 1 VIEW  Result Date: 01/30/2022 CLINICAL DATA:  81 year old male with history of shortness of breath and pleural effusion. EXAM: PORTABLE CHEST 1 VIEW COMPARISON:  Chest x-ray 01/28/2022. FINDINGS: Skin fold artifact projecting over the lateral right hemithorax incidentally noted. Lung volumes are slightly low. Bibasilar opacities which may reflect areas of atelectasis and/or consolidation. Small bilateral pleural effusions. No pneumothorax. No evidence of pulmonary edema. Heart size is mild heart size is enlarged. Upper mediastinal contours are distorted by patient positioning. Atherosclerotic calcifications in the thoracic aorta. Left-sided pacemaker device in place with lead tips projecting over the expected location of the right atrium and right ventricle. IMPRESSION: 1. Low lung volumes with bibasilar opacities favored to reflect areas of atelectasis (airspace consolidation is not excluded), with superimposed small bilateral pleural effusions. 2. Cardiomegaly. 3. Aortic atherosclerosis. Electronically Signed   By: Vinnie Langton M.D.   On: 01/30/2022 06:27   ECHOCARDIOGRAM COMPLETE  Result Date: 01/29/2022    ECHOCARDIOGRAM REPORT   Patient Name:   Edward Butler Date of Exam: 01/29/2022 Medical Rec #:  [id number]     Height:       66.0 in Accession #:    [id number]    Weight:       164.2 lb Date of Birth:  13-Jan-1941      BSA:          1.839 m Patient Age:    66 years      BP:           108/67 mmHg Patient Gender: M             HR:           69 bpm. Exam Location:  Inpatient Procedure: 2D Echo, Cardiac Doppler and Color Doppler STAT ECHO Indications:    Aortic valve disorder  History:        Patient has prior history of Echocardiogram examinations. Risk                  Factors:Dyslipidemia, Diabetes and  Hypertension.  Sonographer:    Jyl Heinz Referring Phys: [id number] Regan Lemming IMPRESSIONS  1. Left ventricular ejection fraction, by estimation, is 60 to 65%. The left ventricle has normal function. The left ventricle has no regional wall motion abnormalities. There is mild concentric left ventricular hypertrophy. Left ventricular diastolic parameters are consistent with Grade II diastolic dysfunction (pseudonormalization). Elevated left ventricular end-diastolic pressure.  2. Right ventricular systolic function is mildly reduced. The right ventricular size is normal. There is moderately elevated pulmonary artery systolic pressure. The estimated right ventricular systolic pressure is 32.9 mmHg.  3. Left atrial size was mildly dilated.  4. Right atrial size was mildly dilated.  5. Pericardial effusion measures 1.51cm posteriorly at greatest diameter.. Moderate pericardial effusion. The pericardial effusion is circumferential.  6. The mitral valve is degenerative. Mild to moderate mitral valve regurgitation. No evidence of mitral stenosis. Moderate mitral annular calcification.  7. The aortic valve is calcified. There is severe calcifcation of the aortic valve. There is severe thickening of the aortic valve. Aortic valve regurgitation is mild. Severe aortic valve stenosis. Aortic regurgitation PHT measures 485 msec. Aortic valve area, by VTI measures 0.74 cm. Aortic valve mean gradient measures 42.0 mmHg. Aortic valve Vmax measures 4.05 m/s.  8. The inferior vena cava is normal in size with greater than 50% respiratory variability, suggesting right atrial pressure of 3 mmHg.  9. Compared to study dated 10/29/2021, the mean AVG has increased from 36.65mg to 426mg, DVI has decreased from 0.35 to 0.24, Vmax has increased from 3.8823mto 4.37m3mnd AVA has decreased from 0.99cm2 to 0.74cm2 all consistent with now severe AS. FINDINGS  Left Ventricle: Left ventricular ejection  fraction, by estimation, is 60 to 65%. The left ventricle has normal function. The left ventricle has no regional wall motion abnormalities. The left ventricular internal cavity size was normal in size. There is  mild concentric left ventricular hypertrophy. Left ventricular diastolic parameters are consistent with Grade II diastolic dysfunction (pseudonormalization). Elevated left ventricular end-diastolic pressure. Right Ventricle: The right ventricular size is normal. No increase in right ventricular wall thickness. Right ventricular systolic function is mildly reduced. There is moderately elevated pulmonary artery systolic pressure. The tricuspid regurgitant velocity is 2.84 m/s, and with an assumed right atrial pressure of 15 mmHg, the estimated right ventricular systolic pressure is 47.392.4g. Left Atrium: Left atrial size was mildly dilated. Right Atrium: Right atrial size was mildly dilated. Pericardium: Pericardial effusion measures 1.51cm posteriorly at greatest diameter. A moderately sized pericardial effusion is present. The pericardial effusion is circumferential. Mitral Valve: The mitral valve is degenerative in appearance. There is mild calcification of the mitral valve leaflet(s). Moderate mitral annular calcification. Mild to moderate mitral valve regurgitation. No evidence of mitral valve stenosis. MV peak gradient, 8.9 mmHg. The mean mitral valve gradient is 3.0 mmHg. Tricuspid Valve: The tricuspid valve is normal in structure. Tricuspid valve regurgitation is mild . No evidence of tricuspid stenosis. Aortic Valve: The aortic valve is calcified. There is severe calcifcation of the aortic valve. There is severe thickening of the aortic valve. Aortic valve regurgitation is mild. Aortic regurgitation PHT measures 485 msec. Severe aortic stenosis is present. Aortic valve mean gradient measures 42.0 mmHg. Aortic valve peak gradient measures 65.6 mmHg. Aortic valve area, by VTI measures 0.74 cm.  Pulmonic Valve: The pulmonic valve was normal in structure. Pulmonic valve regurgitation is not visualized. No evidence of pulmonic stenosis. Aorta: The aortic root is normal in size and structure. Venous: The inferior vena  cava is normal in size with greater than 50% respiratory variability, suggesting right atrial pressure of 3 mmHg. IAS/Shunts: No atrial level shunt detected by color flow Doppler. Additional Comments: A device lead is visualized.  LEFT VENTRICLE PLAX 2D LVIDd:         4.20 cm      Diastology LVIDs:         2.70 cm      LV e' medial:    4.99 cm/s LV PW:         1.20 cm      LV E/e' medial:  29.5 LV IVS:        1.40 cm      LV e' lateral:   4.21 cm/s LVOT diam:     2.00 cm      LV E/e' lateral: 34.9 LV SV:         76 LV SV Index:   41 LVOT Area:     3.14 cm  LV Volumes (MOD) LV vol d, MOD A2C: 90.5 ml LV vol d, MOD A4C: 102.0 ml LV vol s, MOD A2C: 36.1 ml LV vol s, MOD A4C: 38.1 ml LV SV MOD A2C:     54.4 ml LV SV MOD A4C:     102.0 ml LV SV MOD BP:      60.6 ml RIGHT VENTRICLE            IVC RV Basal diam:  3.60 cm    IVC diam: 2.30 cm RV Mid diam:    3.20 cm RV S prime:     7.36 cm/s TAPSE (M-mode): 1.5 cm LEFT ATRIUM             Index        RIGHT ATRIUM           Index LA diam:        4.50 cm 2.45 cm/m   RA Area:     20.40 cm LA Vol (A2C):   51.8 ml 28.16 ml/m  RA Volume:   62.20 ml  33.82 ml/m LA Vol (A4C):   84.5 ml 45.94 ml/m LA Biplane Vol: 72.1 ml 39.20 ml/m  AORTIC VALVE AV Area (Vmax):    0.78 cm AV Area (Vmean):   0.83 cm AV Area (VTI):     0.74 cm AV Vmax:           405.00 cm/s AV Vmean:          290.600 cm/s AV VTI:            1.020 m AV Peak Grad:      65.6 mmHg AV Mean Grad:      42.0 mmHg LVOT Vmax:         100.70 cm/s LVOT Vmean:        77.150 cm/s LVOT VTI:          0.242 m LVOT/AV VTI ratio: 0.24 AI PHT:            485 msec  AORTA Ao Root diam: 2.80 cm Ao Asc diam:  3.00 cm MITRAL VALVE                TRICUSPID VALVE MV Area (PHT): 2.83 cm     TR Peak grad:   32.3  mmHg MV Area VTI:   1.92 cm     TR Vmax:        284.00 cm/s MV Peak grad:  8.9 mmHg MV Mean grad:  3.0 mmHg  SHUNTS MV Vmax:       1.49 m/s     Systemic VTI:  0.24 m MV Vmean:      85.3 cm/s    Systemic Diam: 2.00 cm MV Decel Time: 268 msec MR Peak grad: 138.9 mmHg MR Mean grad: 97.0 mmHg MR Vmax:      589.33 cm/s MR Vmean:     478.0 cm/s MV E velocity: 147.00 cm/s MV A velocity: 121.00 cm/s MV E/A ratio:  1.21 Fransico Him MD Electronically signed by Fransico Him MD Signature Date/Time: 01/29/2022/9:19:21 AM    Final    CT HEAD WO CONTRAST (5MM)  Result Date: 01/28/2022 CLINICAL DATA:  Acute neurological deficit.  Stroke suspected. EXAM: CT HEAD WITHOUT CONTRAST TECHNIQUE: Contiguous axial images were obtained from the base of the skull through the vertex without intravenous contrast. RADIATION DOSE REDUCTION: This exam was performed according to the departmental dose-optimization program which includes automated exposure control, adjustment of the mA and/or kV according to patient size and/or use of iterative reconstruction technique. COMPARISON:  None Available. FINDINGS: Brain: Note that patient received contrast material for a CTA chest earlier this evening, severely limiting the ability to detect intracranial hemorrhage. There is mild diffuse cerebral atrophy. Low-attenuation changes in the deep white matter consistent with small vessel ischemia. 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 There is a focal area of increased density in the right parietal lobe extending to the cortical surface. This is probably an area of contrast enhancement, possibly representing a venous malformation, but this could also represent an area of infarct or acute intraparenchymal hemorrhage. Enhancing tumor is less likely. Consider MRI versus repeat CT in 24 hours after contrast material has washed out. No mass effect or midline shift. No abnormal extra-axial fluid collections. Gray-white matter junctions are distinct.  Vascular: Vascular calcifications.  No aneurysm identified. Skull: Calvarium appears intact. Sinuses/Orbits: Paranasal sinuses and mastoid air cells are clear. Other: None. IMPRESSION: 1. Examination is technically limited due to residual IV contrast material from prior study. 2. Focal area of increased attenuation in the right parietal region. Differential diagnosis includes contrast enhancement in a AVM or less likely mass versus infarct or hemorrhage. Suggest MRI or repeat CT after contrast material has washed out. 3. Chronic atrophy and small vessel ischemic changes. Electronically Signed   By: Lucienne Capers M.D.   On: 01/28/2022 22:45   CT Angio Chest/Abd/Pel for Dissection W and/or Wo Contrast  Result Date: 01/28/2022 CLINICAL DATA:  Acute aortic syndrome (AAS) suspected. Shortness of breath. EXAM: CT ANGIOGRAPHY CHEST, ABDOMEN AND PELVIS TECHNIQUE: Non-contrast CT of the chest was initially obtained. Multidetector CT imaging through the chest, abdomen and pelvis was performed using the standard protocol during bolus administration of intravenous contrast. Multiplanar reconstructed images and MIPs were obtained and reviewed to evaluate the vascular anatomy. RADIATION DOSE REDUCTION: This exam was performed according to the departmental dose-optimization program which includes automated exposure control, adjustment of the mA and/or kV according to patient size and/or use of iterative reconstruction technique. CONTRAST:  230m OMNIPAQUE IOHEXOL 350 MG/ML SOLN COMPARISON:  01/16/2022 FINDINGS: CTA CHEST FINDINGS Cardiovascular: Heart is normal size. Small to moderate pericardial effusion, stable since prior study. Extensive coronary artery calcifications. Moderate aortic calcifications. No evidence of aortic aneurysm or dissection. Mediastinum/Nodes: No mediastinal, hilar, or axillary adenopathy. Trachea and esophagus are unremarkable. Thyroid unremarkable. Lungs/Pleura: Small bilateral pleural effusions,  new since prior study. Bibasilar atelectasis. Musculoskeletal: Left chest wall pacer noted. Chest wall soft tissues are unremarkable.  No acute bony abnormality. Review of the MIP images confirms the above findings. CTA ABDOMEN AND PELVIS FINDINGS VASCULAR Aorta: Diffuse aortic atherosclerosis. No evidence of aortic aneurysm or dissection. Celiac: Widely patent. SMA: Widely patent Renals: Single bilaterally, widely patent IMA: Widely patent Inflow: Atherosclerotic calcifications diffusely. No aneurysm or dissection. Veins: No obvious venous abnormality within the limitations of this arterial phase study. Review of the MIP images confirms the above findings. NON-VASCULAR Hepatobiliary: Prior cholecystectomy.  No focal hepatic abnormality. Pancreas: No focal abnormality or ductal dilatation. There is mild stranding seen adjacent to the pancreatic head and in the adjacent porta hepatis and adjacent to the descending duodenum. Also mild stranding adjacent to the pancreatic tail. This could reflect early acute pancreatitis. Recommend clinical correlation. Spleen: No focal abnormality.  Normal size. Adrenals/Urinary Tract: Adrenal glands unremarkable. Bilateral small renal cysts, appear benign. No follow-up imaging recommended. No hydronephrosis. Urinary bladder decompressed. Stomach/Bowel: Left colonic diverticulosis. No active diverticulitis. Stomach and small bowel decompressed, grossly unremarkable. Lymphatic: No adenopathy Reproductive: No visible focal abnormality. Other: No free fluid or free air. Bilateral inguinal hernias containing fat, left larger than right. Musculoskeletal: No acute bony abnormality. Review of the MIP images confirms the above findings. IMPRESSION: Aortic atherosclerosis. No evidence of aortic aneurysm or dissection. Small bilateral pleural effusions.  Bibasilar atelectasis. Mild stranding/edema noted adjacent to the pancreatic head and pancreatic tail raising the possibility of early acute  pancreatitis. Recommend clinical correlation. Left colonic diverticulosis. Electronically Signed   By: Rolm Baptise M.D.   On: 01/28/2022 20:31   DG Chest Port 1 View  Result Date: 01/28/2022 CLINICAL DATA:  Presyncope EXAM: PORTABLE CHEST 1 VIEW COMPARISON:  None Available. FINDINGS: Mild bibasilar atelectasis or infiltrate. Small right pleural effusion is present. No pneumothorax. Cardiomegaly is stable. 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 Pulmonary vascularity is normal. No acute bone abnormality. IMPRESSION: 1. Mild bibasilar atelectasis or infiltrate. Small right pleural effusion. 2. Stable cardiomegaly. Electronically Signed   By: Fidela Salisbury M.D.   On: 01/28/2022 19:47   CUP PACEART INCLINIC DEVICE CHECK  Result Date: 01/28/2022 Wound check appointment. Steri-strips removed. Wound without redness or edema. Incision edges approximated, wound well healed. Normal device function. RA/RV sensing, and impedances consistent with implant measurements. Unable to run thresholds secondary to RVR. Burden 99.8% with current RVR episode ongoing from 1 day, 10 hours ago. Patient symptomatic complaining of inability to take a deep breath. +OAC. Dr. Caryl Comes in to assess patient and patient added to Dr. Aquilla Hacker schedule today. Device programmed at 3.5V/auto capture programmed on for extra safety margin until 3 month visit. Histogram distribution appropriate for patient and level of activity.   STS Risk Calculator: Procedure: AV Replacement: Risk of Mortality: 2.193% Renal Failure: 2.690% Permanent Stroke: 2.210% Prolonged Ventilation: 6.339% DSW Infection: 0.101% Reoperation: 3.770% Morbidity or Mortality: 10.969% Short Length of Stay: 34.206% Long Length of Stay: 6.113%  [facility] Cardiomyopathy Questionnaire     01/30/2022    1:36 PM  KCCQ-12  1 a. Ability to shower/bathe Moderately limited  1 b. Ability to walk 1 block Moderately limited  1 c. Ability to hurry/jog Other,  Did not do  2. Edema feet/ankles/legs Never over the past 2 weeks  3. Limited by fatigue 1-2 times a week  4. Limited by dyspnea 3+ times a week, not every day  5. Sitting up / on 3+ pillows Less than once a week  6. Limited enjoyment of life Moderately limited  7. Rest of life w/ symptoms  Mostly dissatisfied  8 a. Participation in hobbies Moderately limited  8 b. Participation in chores Moderately limited  8 c. Visiting family/friends Moderately limited    Assessment and Plan:   Edward Butler is a 81 y.o. 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 I have reviewed the patient's recent echocardiogram which is notable for normal LV systolic function and severe aortic stenosis with peak gradient of 65.13mHg and mean transvalvular gradient of 42.070mg. The patient's dimensionless index is 0.24 and calculated aortic valve area is 0.74 cm.    I have reviewed the natural history of aortic stenosis with the patient. We have discussed the limitations of medical therapy and the poor prognosis associated with symptomatic aortic stenosis. We have reviewed potential treatment options, including palliative medical therapy, conventional surgical aortic valve replacement, and transcatheter aortic valve replacement. We discussed treatment options in the context of this patient's specific comorbid medical conditions.    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 Once he has been stabilized from a cardiac and fluid volume standpoint, we will seen him back in the outpatient setting to complete his workup with a gated  cardiac CTA and a CTA of the chest/abdomen/pelvis to evaluate both his cardiac anatomy and peripheral vasculature along with R/LHC.    For questions or updates, please contact [facility] consult [email or web address] for contact info under   Signed, MiSherren MochaMD  01/30/2022 3:00 PM   Patient seen, examined. Available data reviewed. Agree with findings, assessment, and plan as outlined by JiKathyrn DrownNP.  The patient is independently interviewed and examined.  His daughter is at the bedside.  He is alert, oriented, elderly male in no distress.  He is short of breath with conversation.  HEENT is normal.  JVP is elevated with positive HJR.  Lungs are clear bilaterally.  Heart is irregularly irregular with grade 3/6 systolic murmur at the right upper sternal border, somewhat distant heart sounds.  Abdomen is soft and nontender.  Extremities have no significant edema.  Skin is warm and dry with no rash.  Neurologic is grossly intact.  The patient's echocardiogram is personally reviewed and shows normal LVEF of 60 to 6598%grade 2 diastolic dysfunction, moderate pericardial effusion, and severe aortic stenosis with a dimensionless index of 0.24, V-max of 3.9, mean gradient of 42 mmHg, and valve area of 0.74 cm.  Between yesterday and today, a limited echo is again performed and shows increase in the patient's pericardial effusion, now circumferential and moderate to large.  We are asked to see the patient regarding his aortic stenosis and potential treatment options.  I think he will likely be a good candidate for TAVR pending further evaluation.  He will require CT angiography studies and right left heart catheterization to better assess the functional anatomy of his aortic valve, relationships to the coronary arteries, and vascular access for TAVR.  However, the more pressing issue is the fact that he is exhibiting signs of cardiac tamponade both clinically and by echo criteria.  The patient  underwent recent pacemaker implantation complicated by microperforation.  He had a lead revision about 2 weeks ago.  There is an increase in the size  of his pericardial effusion in the last 24 hours and he has had intermittent hypotension during his hospital stay.  He remains in atrial fibrillation with RVR.  He reports progressive shortness of breath.  The patient has been anticoagulated I suspect he has a hemorrhagic effusion.  I reviewed treatment options with the patient and his daughter.  While there is some concern over the fact that he is anticoagulated with last dose of apixaban this morning, I do not think we should wait to drain his effusion as there is risk of further hemodynamic compromise especially in the setting of his comorbidities and known severe aortic stenosis.  I have reviewed risks, indications, and alternatives to needle pericardiocentesis with the patient and his daughter.  I think the risk of serious complication such as bleeding, perforation, need for emergency surgery, serious arrhythmia, myocardial infarction, and other life-threatening complication, there is no greater than 2 to 3%.  The patient understands and provides full informed consent.  Case discussed with Dr. Angelena Form who will do the procedure.  Sherren Mocha, M.D. 01/30/2022 3:00 PM

## 2022-01-30 NOTE — Progress Notes (Signed)
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 Recently has undergone pacemaker placement followed by lead revision due to microperforation complication.  On follow-up visit found to have A-fib and was started on metoprolol outpatient.  After 2 doses of metoprolol as well as Lasix he started having diaphoresis lightheadedness and decided to come to the [facility] further work-up.  Found to have hypovolemic shock treated with IV fluids. Cardiology following.  Echocardiogram shows moderate pericardial effusion. 6/30 patient developed A-fib with RVR and started on amiodarone drip.  Repeat echocardiogram shows worsening pericardial effusion.  Patient underwent pericardiocentesis with removal of 510 mL of bloody fluid.  Currently transferred to ICU under cardiology service. [facility] hospitalist will continue to follow as a consult for medical management.  Assessment and Plan: Pericardial effusion with cardiac tamponade Presents with complaints of hypotension after taking medication. Echocardiogram shows pericardial effusion moderate in size on 6/29. Developed A-fib with RVR and underwent repeat echocardiogram which shows worsening pericardial effusion with RV collapse. Patient underwent pericardiocentesis with 510 mL of bloody fluid removal. Anticoagulation is currently on hold. Work-up is sent for etiology We will defer to cardiology. Currently admitted in the ICU under cardiology.  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 Not a good candidate for rate  controlling medication due to hypotension. Management per cardiology.  Chronic diastolic CHF (congestive heart failure) (HCC) Patient developed orthopnea on 6/30 early in the morning and received 1 dose of IV Lasix. Monitor after pericardiocentesis.  Severe aortic stenosis Structural heart team was consulted. Plan is for outpatient follow-up for consideration of TAVR once patient is stable from volume and pericardial effusion perspective.  Chronic anticoagulation Was on Eliquis for his A-fib history.  Currently on hold.  Hypovolemic shock Lactic acidosis Transaminitis AKI (acute kidney injury) ([place]) Presents with fatigue and tiredness after taking metoprolol and Lasix. On arrival blood pressure was in 70s.  Heart rate was in 50s. Lactic acid level was 8.5.  Serum creatinine at baseline is normal and on admission 1.51. Potassium level 5.8. Most likely hypovolemic shock in the setting of use of metoprolol and Lasix as well as in the setting of severe aortic stenosis and pericardial effusion. Possibly cardiogenic shock in the setting of pericardial tamponade also cannot be ruled out. At present volume status is improved.  Lactic acidosis has improved. LFTs are improving. Renal function has normalized. Due to orthopnea, holding IV fluids. Holding antihypertensive regimen. No evidence of infection.  Procalcitonin level negative.  No other acute complaints provided by the patient.  Numbness of left lower extremity no evidence of stroke or TIA Repeat CT scan on 6/30 shows no evidence of acute abnormality. Abnormal report on initial CT scan most likely an artifact in the setting of patient receiving IV contrast.  Dyslipidemia Continue statin.  OSA on CPAP Continue CPAP nightly.  Hypothyroidism Continue Synthroid.  Type 2 diabetes mellitus with hyperglycemia without complication, without long-term current use of insulin  Continue sliding scale insulin.  BPH (benign prostatic  hyperplasia) On Proscar.  Subjective: Last noted episode of shortness of breath.  No nausea no vomiting.  Currently no chest pain.  Continues to have shortness of breath  but improving.  No fever no chills.  No abdominal pain.  Physical Exam: Vitals:   01/30/22 1623 01/30/22 1628 01/30/22 1633 01/30/22 1638  BP: (!) 135/91 (!) 142/92 119/84 119/84  Pulse: (!) 111 (!) 119 (!) 119 (!) 0  Resp: (!) 36 (!) 35 (!) 40   Temp:      TempSrc:      SpO2: 96% 97% 97%   Weight:      Height:       General: Appear in mild distress; no visible Abnormal Neck Mass Or lumps, Conjunctiva normal Cardiovascular: S1 and S2 Present, aortic systolic  Murmur, Respiratory: increased respiratory effort, Bilateral Air entry present and faint Crackles, no wheezes Abdomen: Bowel Sound present, Non tender  Extremities: no Pedal edema Neurology: alert and oriented to time, place, and person  Gait not checked due to patient safety concerns   Data Reviewed: I have Reviewed nursing notes, Vitals, and Lab results since pt's last encounter. Pertinent lab results CBC and CMP I have ordered test including CBC and CMP I have discussed pt's care plan and test results with cardiology.   Family Communication: Daughter at bedside  Disposition: Status is: Inpatient Remains inpatient appropriate because: Underwent pericardiocentesis.  Need to ensure that effusion in the pericardial cavity is under control.  Author: Berle Mull, MD 01/30/2022 5:06 PM  Please look on [email or web address] to find out who is on call.

## 2022-01-30 NOTE — Progress Notes (Signed)
Progress Note  Patient Name: Edward Butler Date of Encounter: 01/30/2022  [facility] Cardiologist: Dina Rich, MD   Subjective   He notes acute SOB overnight with afib with RVR up to 140s. He was started on IV amio. He feels better after starting amio.   Inpatient Medications    Scheduled Meds:  apixaban  2.5 mg Oral BID   insulin aspart  0-9 Units Subcutaneous TID WC   levothyroxine  25 mcg Oral Q0600   simvastatin  10 mg Oral Daily   Continuous Infusions:  amiodarone 60 mg/hr (01/30/22 0712)   Followed by   amiodarone     PRN Meds: acetaminophen **OR** acetaminophen, albuterol   Vital Signs    Vitals:   01/30/22 0518 01/30/22 0600 01/30/22 0730 01/30/22 0800  BP: (!) 121/56  (!) 129/91   Pulse: 71 92 (!) 109 (!) 122  Resp:  (!) 22 (!) 24 (!) 26  Temp: 98.7 F (37.1 C)  98 F (36.7 C)   TempSrc: Oral  Oral   SpO2: 94% 97% 97% 98%  Weight:      Height:        Intake/Output Summary (Last 24 hours) at 01/30/2022 0816 Last data filed at 01/30/2022 0701 Gross per 24 hour  Intake 2183.33 ml  Output 2300 ml  Net -116.67 ml      01/30/2022    4:54 AM 01/29/2022    4:09 AM 01/14/2022    6:38 AM  Last 3 Weights  Weight (lbs) 165 lb 2 oz 164 lb 3.9 oz 160 lb  Weight (kg) 74.9 kg 74.5 kg 72.576 kg      Telemetry    Afib rates 120s  - Personally Reviewed  ECG    N/A today - Personally Reviewed  Physical Exam   GEN: no significant distress on [place] Neck: Short and thick neck  Cardiac: irregular rate, tachycardic Respiratory: nl wob, good air movement GI: Soft, nontender MS: No leg edema Neuro:  Nonfocal  Psych: Normal affect   Labs    High Sensitivity Troponin:   Recent Labs  Lab 01/16/22 0659 01/16/22 0942 01/28/22 1856 01/28/22 2025  TROPONINIHS 23* 19* 14 13     Chemistry Recent Labs  Lab 01/28/22 1856 01/28/22 1905 01/29/22 0510 01/30/22 0055  NA 137 135 140 138  K 5.8* 4.9 4.5 3.7  CL 106 107 107 114*  CO2 15*  --  21* 20*   GLUCOSE 303* 285* 135* 192*  BUN 22 24* 22 18  CREATININE 1.51* 1.40* 1.50* 1.09  CALCIUM 9.2  --  8.9 8.0*  MG  --   --  2.1 1.8  PROT 6.4*  --  6.0* 5.4*  ALBUMIN 3.5  --  3.1* 2.8*  AST 34  --  303* 108*  ALT 12  --  170* 124*  ALKPHOS 52  --  69 54  BILITOT 1.5*  --  0.9 0.6  GFRNONAA 46*  --  46* >60  ANIONGAP 16*  --  12 4*    Lipids No results for input(s): "CHOL", "TRIG", "HDL", "LABVLDL", "LDLCALC", "CHOLHDL" in the last 168 hours.  Hematology Recent Labs  Lab 01/28/22 1856 01/28/22 1905 01/29/22 0510 01/30/22 0055  WBC 11.8*  --  9.6 8.3  RBC 4.68  --  4.36 4.15*  HGB 14.2 13.9 13.7 12.6*  HCT 44.3 41.0 38.9* 38.1*  MCV 94.7  --  89.2 91.8  MCH 30.3  --  31.4 30.4  MCHC 32.1  --  35.2 33.1  RDW 14.0  --  13.9 13.9  PLT 408*  --  287 238   Thyroid  Recent Labs  Lab 01/29/22 1235  TSH 1.947    BNP Recent Labs  Lab 01/28/22 2025  BNP 377.5*    DDimer No results for input(s): "DDIMER" in the last 168 hours.   Radiology    DG CHEST PORT 1 VIEW  Result Date: 01/30/2022 CLINICAL DATA:  81 year old male with history of shortness of breath and pleural effusion. EXAM: PORTABLE CHEST 1 VIEW COMPARISON:  Chest x-ray 01/28/2022. FINDINGS: Skin fold artifact projecting over the lateral right hemithorax incidentally noted. Lung volumes are slightly low. Bibasilar opacities which may reflect areas of atelectasis and/or consolidation. Small bilateral pleural effusions. No pneumothorax. No evidence of pulmonary edema. Heart size is mild heart size is enlarged. Upper mediastinal contours are distorted by patient positioning. Atherosclerotic calcifications in the thoracic aorta. Left-sided pacemaker device in place with lead tips projecting over the expected location of the right atrium and right ventricle. IMPRESSION: 1. Low lung volumes with bibasilar opacities favored to reflect areas of atelectasis (airspace consolidation is not excluded), with superimposed small  bilateral pleural effusions. 2. Cardiomegaly. 3. Aortic atherosclerosis. Electronically Signed   By: Vinnie Langton M.D.   On: 01/30/2022 06:27   ECHOCARDIOGRAM COMPLETE  Result Date: 01/29/2022    ECHOCARDIOGRAM REPORT   Patient Name:   Edward Butler Date of Exam: 01/29/2022 Medical Rec #:  [id number]     Height:       66.0 in Accession #:    [id number]    Weight:       164.2 lb Date of Birth:  12-04-1940      BSA:          1.839 m Patient Age:    81 years      BP:           108/67 mmHg Patient Gender: M             HR:           69 bpm. Exam Location:  Inpatient Procedure: 2D Echo, Cardiac Doppler and Color Doppler STAT ECHO Indications:    Aortic valve disorder  History:        Patient has prior history of Echocardiogram examinations. Risk                 Factors:Dyslipidemia, Diabetes and Hypertension.  Sonographer:    Jyl Heinz Referring Phys: [id number] Regan Lemming IMPRESSIONS  1. Left ventricular ejection fraction, by estimation, is 60 to 65%. The left ventricle has normal function. The left ventricle has no regional wall motion abnormalities. There is mild concentric left ventricular hypertrophy. Left ventricular diastolic parameters are consistent with Grade II diastolic dysfunction (pseudonormalization). Elevated left ventricular end-diastolic pressure.  2. Right ventricular systolic function is mildly reduced. The right ventricular size is normal. There is moderately elevated pulmonary artery systolic pressure. The estimated right ventricular systolic pressure is 10.9 mmHg.  3. Left atrial size was mildly dilated.  4. Right atrial size was mildly dilated.  5. Pericardial effusion measures 1.51cm posteriorly at greatest diameter.. Moderate pericardial effusion. The pericardial effusion is circumferential.  6. The mitral valve is degenerative. Mild to moderate mitral valve regurgitation. No evidence of mitral stenosis. Moderate mitral annular calcification.  7. The aortic valve is calcified. There is  severe calcifcation of the aortic valve. There is severe thickening of the aortic valve. Aortic valve regurgitation is mild. Severe aortic valve  stenosis. Aortic regurgitation PHT measures 485 msec. Aortic valve area, by VTI measures 0.74 cm. Aortic valve mean gradient measures 42.0 mmHg. Aortic valve Vmax measures 4.05 m/s.  8. The inferior vena cava is normal in size with greater than 50% respiratory variability, suggesting right atrial pressure of 3 mmHg.  9. Compared to study dated 10/29/2021, the mean AVG has increased from 36.20mg to 447mg, DVI has decreased from 0.35 to 0.24, Vmax has increased from 3.8822mto 4.56m6mnd AVA has decreased from 0.99cm2 to 0.74cm2 all consistent with now severe AS. FINDINGS  Left Ventricle: Left ventricular ejection fraction, by estimation, is 60 to 65%. The left ventricle has normal function. The left ventricle has no regional wall motion abnormalities. The left ventricular internal cavity size was normal in size. There is  mild concentric left ventricular hypertrophy. Left ventricular diastolic parameters are consistent with Grade II diastolic dysfunction (pseudonormalization). Elevated left ventricular end-diastolic pressure. Right Ventricle: The right ventricular size is normal. No increase in right ventricular wall thickness. Right ventricular systolic function is mildly reduced. There is moderately elevated pulmonary artery systolic pressure. The tricuspid regurgitant velocity is 2.84 m/s, and with an assumed right atrial pressure of 15 mmHg, the estimated right ventricular systolic pressure is 47.308.6g. Left Atrium: Left atrial size was mildly dilated. Right Atrium: Right atrial size was mildly dilated. Pericardium: Pericardial effusion measures 1.51cm posteriorly at greatest diameter. A moderately sized pericardial effusion is present. The pericardial effusion is circumferential. Mitral Valve: The mitral valve is degenerative in appearance. There is mild  calcification of the mitral valve leaflet(s). Moderate mitral annular calcification. Mild to moderate mitral valve regurgitation. No evidence of mitral valve stenosis. MV peak gradient, 8.9 mmHg. The mean mitral valve gradient is 3.0 mmHg. Tricuspid Valve: The tricuspid valve is normal in structure. Tricuspid valve regurgitation is mild . No evidence of tricuspid stenosis. Aortic Valve: The aortic valve is calcified. There is severe calcifcation of the aortic valve. There is severe thickening of the aortic valve. Aortic valve regurgitation is mild. Aortic regurgitation PHT measures 485 msec. Severe aortic stenosis is present. Aortic valve mean gradient measures 42.0 mmHg. Aortic valve peak gradient measures 65.6 mmHg. Aortic valve area, by VTI measures 0.74 cm. Pulmonic Valve: The pulmonic valve was normal in structure. Pulmonic valve regurgitation is not visualized. No evidence of pulmonic stenosis. Aorta: The aortic root is normal in size and structure. Venous: The inferior vena cava is normal in size with greater than 50% respiratory variability, suggesting right atrial pressure of 3 mmHg. IAS/Shunts: No atrial level shunt detected by color flow Doppler. Additional Comments: A device lead is visualized.  LEFT VENTRICLE PLAX 2D LVIDd:         4.20 cm      Diastology LVIDs:         2.70 cm      LV e' medial:    4.99 cm/s LV PW:         1.20 cm      LV E/e' medial:  29.5 LV IVS:        1.40 cm      LV e' lateral:   4.21 cm/s LVOT diam:     2.00 cm      LV E/e' lateral: 34.9 LV SV:         76 LV SV Index:   41 LVOT Area:     3.14 cm  LV Volumes (MOD) LV vol d, MOD A2C: 90.5 ml LV vol d, MOD A4C: 102.0  ml LV vol s, MOD A2C: 36.1 ml LV vol s, MOD A4C: 38.1 ml LV SV MOD A2C:     54.4 ml LV SV MOD A4C:     102.0 ml LV SV MOD BP:      60.6 ml RIGHT VENTRICLE            IVC RV Basal diam:  3.60 cm    IVC diam: 2.30 cm RV Mid diam:    3.20 cm RV S prime:     7.36 cm/s TAPSE (M-mode): 1.5 cm LEFT ATRIUM             Index         RIGHT ATRIUM           Index LA diam:        4.50 cm 2.45 cm/m   RA Area:     20.40 cm LA Vol (A2C):   51.8 ml 28.16 ml/m  RA Volume:   62.20 ml  33.82 ml/m LA Vol (A4C):   84.5 ml 45.94 ml/m LA Biplane Vol: 72.1 ml 39.20 ml/m  AORTIC VALVE AV Area (Vmax):    0.78 cm AV Area (Vmean):   0.83 cm AV Area (VTI):     0.74 cm AV Vmax:           405.00 cm/s AV Vmean:          290.600 cm/s AV VTI:            1.020 m AV Peak Grad:      65.6 mmHg AV Mean Grad:      42.0 mmHg LVOT Vmax:         100.70 cm/s LVOT Vmean:        77.150 cm/s LVOT VTI:          0.242 m LVOT/AV VTI ratio: 0.24 AI PHT:            485 msec  AORTA Ao Root diam: 2.80 cm Ao Asc diam:  3.00 cm MITRAL VALVE                TRICUSPID VALVE MV Area (PHT): 2.83 cm     TR Peak grad:   32.3 mmHg MV Area VTI:   1.92 cm     TR Vmax:        284.00 cm/s MV Peak grad:  8.9 mmHg MV Mean grad:  3.0 mmHg     SHUNTS MV Vmax:       1.49 m/s     Systemic VTI:  0.24 m MV Vmean:      85.3 cm/s    Systemic Diam: 2.00 cm MV Decel Time: 268 msec MR Peak grad: 138.9 mmHg MR Mean grad: 97.0 mmHg MR Vmax:      589.33 cm/s MR Vmean:     478.0 cm/s MV E velocity: 147.00 cm/s MV A velocity: 121.00 cm/s MV E/A ratio:  1.21 Fransico Him MD Electronically signed by Fransico Him MD Signature Date/Time: 01/29/2022/9:19:21 AM    Final    CT HEAD WO CONTRAST (5MM)  Result Date: 01/28/2022 CLINICAL DATA:  Acute neurological deficit.  Stroke suspected. EXAM: CT HEAD WITHOUT CONTRAST TECHNIQUE: Contiguous axial images were obtained from the base of the skull through the vertex without intravenous contrast. RADIATION DOSE REDUCTION: This exam was performed according to the departmental dose-optimization program which includes automated exposure control, adjustment of the mA and/or kV according to patient size and/or use of iterative reconstruction technique. COMPARISON:  None Available.  FINDINGS: Brain: Note that patient received contrast material for a CTA chest earlier this  evening, severely limiting the ability to detect intracranial hemorrhage. There is mild diffuse cerebral atrophy. Low-attenuation changes in the deep white matter consistent with small vessel ischemia. 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 There is a focal area of increased density in the right parietal lobe extending to the cortical surface. This is probably an area of contrast enhancement, possibly representing a venous malformation, but this could also represent an area of infarct or acute intraparenchymal hemorrhage. Enhancing tumor is less likely. Consider MRI versus repeat CT in 24 hours after contrast material has washed out. No mass effect or midline shift. No abnormal extra-axial fluid collections. Gray-white matter junctions are distinct. Vascular: Vascular calcifications.  No aneurysm identified. Skull: Calvarium appears intact. Sinuses/Orbits: Paranasal sinuses and mastoid air cells are clear. Other: None. IMPRESSION: 1. Examination is technically limited due to residual IV contrast material from prior study. 2. Focal area of increased attenuation in the right parietal region. Differential diagnosis includes contrast enhancement in a AVM or less likely mass versus infarct or hemorrhage. Suggest MRI or repeat CT after contrast material has washed out. 3. Chronic atrophy and small vessel ischemic changes. Electronically Signed   By: Lucienne Capers M.D.   On: 01/28/2022 22:45   CT Angio Chest/Abd/Pel for Dissection W and/or Wo Contrast  Result Date: 01/28/2022 CLINICAL DATA:  Acute aortic syndrome (AAS) suspected. Shortness of breath. EXAM: CT ANGIOGRAPHY CHEST, ABDOMEN AND PELVIS TECHNIQUE: Non-contrast CT of the chest was initially obtained. Multidetector CT imaging through the chest, abdomen and pelvis was performed using the standard protocol during bolus administration of intravenous contrast. Multiplanar reconstructed images and MIPs were obtained and reviewed to evaluate the vascular  anatomy. RADIATION DOSE REDUCTION: This exam was performed according to the departmental dose-optimization program which includes automated exposure control, adjustment of the mA and/or kV according to patient size and/or use of iterative reconstruction technique. CONTRAST:  228m OMNIPAQUE IOHEXOL 350 MG/ML SOLN COMPARISON:  01/16/2022 FINDINGS: CTA CHEST FINDINGS Cardiovascular: Heart is normal size. Small to moderate pericardial effusion, stable since prior study. Extensive coronary artery calcifications. Moderate aortic calcifications. No evidence of aortic aneurysm or dissection. Mediastinum/Nodes: No mediastinal, hilar, or axillary adenopathy. Trachea and esophagus are unremarkable. Thyroid unremarkable. Lungs/Pleura: Small bilateral pleural effusions, new since prior study. Bibasilar atelectasis. Musculoskeletal: Left chest wall pacer noted. Chest wall soft tissues are unremarkable. No acute bony abnormality. Review of the MIP images confirms the above findings. CTA ABDOMEN AND PELVIS FINDINGS VASCULAR Aorta: Diffuse aortic atherosclerosis. No evidence of aortic aneurysm or dissection. Celiac: Widely patent. SMA: Widely patent Renals: Single bilaterally, widely patent IMA: Widely patent Inflow: Atherosclerotic calcifications diffusely. No aneurysm or dissection. Veins: No obvious venous abnormality within the limitations of this arterial phase study. Review of the MIP images confirms the above findings. NON-VASCULAR Hepatobiliary: Prior cholecystectomy.  No focal hepatic abnormality. Pancreas: No focal abnormality or ductal dilatation. There is mild stranding seen adjacent to the pancreatic head and in the adjacent porta hepatis and adjacent to the descending duodenum. Also mild stranding adjacent to the pancreatic tail. This could reflect early acute pancreatitis. Recommend clinical correlation. Spleen: No focal abnormality.  Normal size. Adrenals/Urinary Tract: Adrenal glands unremarkable. Bilateral small  renal cysts, appear benign. No follow-up imaging recommended. No hydronephrosis. Urinary bladder decompressed. Stomach/Bowel: Left colonic diverticulosis. No active diverticulitis. Stomach and small bowel decompressed, grossly unremarkable. Lymphatic: No adenopathy Reproductive: No visible focal abnormality. Other: No free  fluid or free air. Bilateral inguinal hernias containing fat, left larger than right. Musculoskeletal: No acute bony abnormality. Review of the MIP images confirms the above findings. IMPRESSION: Aortic atherosclerosis. No evidence of aortic aneurysm or dissection. Small bilateral pleural effusions.  Bibasilar atelectasis. Mild stranding/edema noted adjacent to the pancreatic head and pancreatic tail raising the possibility of early acute pancreatitis. Recommend clinical correlation. Left colonic diverticulosis. Electronically Signed   By: Rolm Baptise M.D.   On: 01/28/2022 20:31   DG Chest Port 1 View  Result Date: 01/28/2022 CLINICAL DATA:  Presyncope EXAM: PORTABLE CHEST 1 VIEW COMPARISON:  None Available. FINDINGS: Mild bibasilar atelectasis or infiltrate. Small right pleural effusion is present. No pneumothorax. Cardiomegaly is stable. 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 Pulmonary vascularity is normal. No acute bone abnormality. IMPRESSION: 1. Mild bibasilar atelectasis or infiltrate. Small right pleural effusion. 2. Stable cardiomegaly. Electronically Signed   By: Fidela Salisbury M.D.   On: 01/28/2022 19:47   CUP PACEART INCLINIC DEVICE CHECK  Result Date: 01/28/2022 Wound check appointment. Steri-strips removed. Wound without redness or edema. Incision edges approximated, wound well healed. Normal device function. RA/RV sensing, and impedances consistent with implant measurements. Unable to run thresholds secondary to RVR. Burden 99.8% with current RVR episode ongoing from 1 day, 10 hours ago. Patient symptomatic complaining of inability to take a deep breath. +OAC.  Dr. Caryl Comes in to assess patient and patient added to Dr. Aquilla Hacker schedule today. Device programmed at 3.5V/auto capture programmed on for extra safety margin until 3 month visit. Histogram distribution appropriate for patient and level of activity.   Cardiac Studies    Echo from 01/29/22:   1. Left ventricular ejection fraction, by estimation, is 60 to 65%. The  left ventricle has normal function. The left ventricle has no regional  wall motion abnormalities. There is mild concentric left ventricular  hypertrophy. Left ventricular diastolic  parameters are consistent with Grade II diastolic dysfunction  (pseudonormalization). Elevated left ventricular end-diastolic pressure.   2. Right ventricular systolic function is mildly reduced. The right  ventricular size is normal. There is moderately elevated pulmonary artery  systolic pressure. The estimated right ventricular systolic pressure is  62.3 mmHg.   3. Left atrial size was mildly dilated.   4. Right atrial size was mildly dilated.   5. Pericardial effusion measures 1.51cm posteriorly at greatest  diameter.. Moderate pericardial effusion. The pericardial effusion is  circumferential.   6. The mitral valve is degenerative. Mild to moderate mitral valve  regurgitation. No evidence of mitral stenosis. Moderate mitral annular  calcification.   7. The aortic valve is calcified. There is severe calcifcation of the  aortic valve. There is severe thickening of the aortic valve. Aortic valve  regurgitation is mild. Severe aortic valve stenosis. Aortic regurgitation  PHT measures 485 msec. Aortic  valve area, by VTI measures 0.74 cm. Aortic valve mean gradient measures  42.0 mmHg. Aortic valve Vmax measures 4.05 m/s.   8. The inferior vena cava is normal in size with greater than 50%  respiratory variability, suggesting right atrial pressure of 3 mmHg.   9. Compared to study dated 10/29/2021, the mean AVG has increased from  36.45mg to 436mg,  DVI has decreased from 0.35 to 0.24, Vmax has increased  from 3.8846mto 4.39m60mnd AVA has decreased from 0.99cm2 to 0.74cm2 all  consistent with now severe AS.   Patient Profile     81 y35. 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   Seen by EP office 01/28/22 for PPM wound check, wound healed well, normal device function, RA/RV sensing, and impedances consistent with implant measurements. He was noted in A fib RVR with burden 99.8%. He had complained DOE, orthopnea. Device programmed at 3.5V/auto capture programmed on for extra safety margin until 3 month visit. He was started on lasix '20mg'$  daily and metoprolol '25mg'$  BID to help volume overload and tachycardia.   Labs at admission with AKI Cr 1.51, transaminitis AST 303, ALT170, bilirubin WNL;albumin 3.1. BNP 377. Hs trop negative x2. Lactic acid 8.5>5.8 >3.7 >1.8. Procalcitonin <0.1. CBC diff with mild left shift. CXR with Mild bibasilar atelectasis or infiltrate, small right pleural effusion. CTA torso without aortic dissection, Small bilateral pleural effusions.  Bibasilar atelectasis. Mild stranding/edema noted adjacent to the pancreatic head and pancreatic tail raising the possibility of early acute pancreatitis. CTH with Focal area of increased attenuation in the right parietal region, AVM vs mass vs infarct vs hemorrhage.   Assessment & Plan     Hypotension: Had prior microperforation with pericardial effusion; had some borderline echo features of tamponade but not clinically. He responded well to fluids. He has normal LVEF. No signs of sepsis. Was given BB to help with symptoms, he did not tolerate this well. Will stop - s/p  fluids - improved  SOB: was SOB overnight. Received fluids all day yesterday. He is tenuous with severe AS with heavily calcified valve. Also has pericardial effusion that has grown in size in the last couple of weeks. There was concern for microperforation of his prior leads, prompting lead revision 6/16. He had a small pericardial effusion at that time - will get repeat echo today to assess pericardial effusion size  - will allow him to equilibrate, sensitive to any volume shift  AKI: prerenal: improved with fluids. Normal Crt today  Paroxysmal A fib with RVR:  was managed on verapamil 240 mg daily outpatient. Was in sinus rhythm here -went into afib with rvr overnight started in IV amiodarone -continue IV amiodarone for now  S/P [employer] DC PPM implant 01/14/22  for tachybrady syndrome - complicated by microperforation of the atrial lead s/p PPM lead revision 01/16/22 - device interrogation 01/28/22 with normal device function - discussed with EP  - no signs of device malfunction; good capture on ECG  Severe aortic stenosis  - Echo compared to study dated 10/29/2021, the mean AVG has increased from 36.78mg to 467mg, DVI has decreased from 0.35 to 0.24, Vmax has increased from 3.8867mto 4.55m25mnd AVA has decreased from 0.99cm2 to 0.74cm2 all consistent with now severe AS.  - Structural heart team to see outpatient   Acute on chronic diastolic heart failure  - DOE with orthopnea improved after taking Lasix outpatient; however became hypotensive. Again he is tenuous from a volume standpoint  Type 2 DM Hypothyroidism HLD OSA on CPAP - per IM   For questions or updates, please contact [email or web address] consult [email or web address] for contact info under        Signed, BranJanina Mayo  01/30/2022, 8:16 AM

## 2022-01-30 NOTE — Progress Notes (Signed)
ABI study completed. Please see CV Proc for preliminary results.  Einer Meals BS, RVT 01/30/2022 11:48 AM

## 2022-01-30 NOTE — H&P (View-Only) (Signed)
[place] VALVE TEAM  Cardiology Consultation:   Patient ID: Edward Butler MRN: [id number]; DOB: 07-31-1941  Admit date: 01/28/2022 Date of Consult: 01/30/2022  Primary Care Provider: Elsie Stain, MD [facility] Cardiologist: Dr. Marlou Porch, MD  [facility] Electrophysiologist:  Virl Axe, MD   Patient Profile:   Edward Butler is a 81 y.o. male with a hx of paroxysmal atrial fibrillation on Eliquis, SSS s/p PPM placement with recent lead revision 01/2022 followed by Dr. Caryl Comes, CVA, OSA on CPAP, HTN, DM2, hypothyroidism, HLD, and progressive aortic stenosis  who is being seen today for the evaluation of severe AS at the request of Dr. Harl Bowie.  History of Present Illness:   Edward Butler is an 81yo M who lives in [place] with one of his daughters and her husband. He has a second daughter who lives near [facility] who is here today with him. 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 He retired completley from there 2 years ago at the age of 81yo. Until January of this year he was walking at least 4 times per week for exercise. He states that his health took a down turn around 10/2021 when he began having issues uncontrolled AF rates. He follows with a dentist on a regular basis and has no acute dental issues.   Mr. Vantol was previously followed by Dr. Salvadore Oxford with [facility] cardiology for his PAF. He was seen 07/2021 and seemed to be doing well from a CV standpoint. Echocardiogram from 03/27/2021 showed normal LVEF at 55-60% with moderate to severe aortic stenosis. He was admitted 10/2021 at [facility] for pneumonia and atrial fibrillation with RVR. Cardiology was consulted at which time he was treated with IV diltiazem with eventual conversion to NSR. He was discharged on PO diltiazem and metoprolol. He was then seen back in the ED 10/27/21 with  pre-syncope complaints. HR at that time was in the 30's, treated with atropine. He was admitted with symptomatic bradycardia and seen by cardiology. Diltiazem and metoprolol were stopped with HR improvement. Due to moderate/severe AS, echocardiogram was update at that time which showed normal LV function with severe aortic calcifcation with an AVA by VTI at 0.99 cm, mean gradient at 36.56mHg, peak at 60.553mg, DI at 0.35, and SVI at 47. He was placed on an event monitor which showed a 26% AF burden with HR's in the 140-150 range. To mitigate his rapid rates and issues with rate controlling agents with symptomatic bradycardia, plan was for PPM placement. He underwent PPM implant 01/14/22. This was complicated by post implant pericardial effusion which ultimately led to a lead revision with Dr. TaLovena Len 01/16/22. In follow up with Dr. KlCaryl Comese was started on PO Lasix x 3 days and metoprolol was added back to his regimen.   [facility] hypot[facility]. He reported that after being seen in the office, he took his first dose of metoprolol and began to feel diaphoretic with associated dizziness. He went to the ED and was found to be hypotensive with an initial BP at 79/57. He was treated with IVF and pressors with improvement. HR's at that time were stable in the 60's however was also having issues with AF RVR. Symptoms felt possibly related to rapid rates and questionable cardiac tamponade along with severe AS. Repeat echo 6/29 showed normal LVEF at 60-65% with  G2DD, circumferential pericardial effusion measuring 1.51cm posteriorly, mild to mod MR, and severe aortic stenosis with mean gradient at 23mHg, peak at 35.626mg, AVA by VTI at 0.74cm2, DI at 0.24, and SVI at 41. Rates have remained rather difficult to control, therefore he has been placed on IV Amiodarone with symptom improvement. His hospital course has additionally been complicated by AKI and CHF. Repeat limited echo today  with increased effusion, especially in apical and subcostal views with concern for tamponade especially given recent Eliquis. Limited echo reviewed with Dr. CoBurt Knack  Today he reports more shortness of breath than previously noted. He denies chest pain, palpitations, LE edema, orthopnea, dizziness, or syncope.   Past Medical History:  Diagnosis Date   [place]neurysm ([facility]   Atrial fibrillation ([facility]   CAP (community acquired pneumonia) 10/20/2021   Diabetes mellitus without complication ([facility]   Hypertension    Non-recurrent acute suppurative otitis media of right ear without spontaneous rupture of tympanic membrane 07/04/2021   [facility]    Past Surgical History:  Procedure Laterality Date   LEAD REVISION/REPAIR N/A 01/16/2022   Procedure: LEAD REVISION/REPAIR;  Surgeon: TaEvans LanceMD;  Location: [facility];  Service: Cardiovascular;  Laterality: N/A;   PACEMAKER IMPLANT N/A 01/14/2022   Procedure: PACEMAKER IMPLANT;  Surgeon: KlDeboraha SprangMD;  Location: [facility];  Service: Cardiovascular;  Laterality: N/A;   skin cancer surgeries        Home Medications:  Prior to Admission medications   Medication Sig Start Date End Date Taking? Authorizing Provider  acetaminophen (TYLENOL) 500 MG tablet Take 1,000 mg by mouth daily as needed for moderate pain.   Yes [provider]  apixaban (ELIQUIS) 5 MG TABS tablet Take 5 mg by mouth 2 (two) times daily. 07/13/19  Yes [provider]  Calcium Carbonate-Vitamin D (CALCIUM 600+D PO) Take 2 tablets by mouth daily.   Yes [provider]  cholecalciferol (VITAMIN D3) 25 MCG (1000 UNIT) tablet Take 1,000 Units by mouth daily.   Yes [provider]  enalapril (VASOTEC) 5 MG tablet Take 5 mg by mouth daily. 09/28/18  Yes [provider]  finasteride (PROSCAR) 5 MG tablet TAKE 1 TABLET EVERY DAY Patient taking differently: Take 5 mg by mouth daily. 09/19/21  Yes WrElsie StainMD   furosemide (LASIX) 20 MG tablet Take 1 tablet by mouth today and then daily for the next 3 days then stop. 01/28/22  Yes KlDeboraha SprangMD  levothyroxine (SYNTHROID) 25 MCG tablet Take 25 mcg by mouth daily before breakfast. 12/03/18  Yes [provider]  magnesium oxide (MAG-OX) 400 MG tablet Take 400 mg by mouth daily. 12/13/18  Yes [provider]  metFORMIN (GLUCOPHAGE-XR) 500 MG 24 hr tablet Take 2 tablets (1,000 mg total) by mouth 2 (two) times daily. 09/19/21  Yes WrElsie StainMD  metoprolol tartrate (LOPRESSOR) 25 MG tablet Take 1 tablet (25 mg total) by mouth 2 (two) times daily. 01/28/22  Yes KlDeboraha SprangMD  simvastatin (ZOCOR) 10 MG tablet Take 10 mg by mouth daily. 09/28/18  Yes [provider]  verapamil (CALAN-SR) 240 MG CR tablet Take 1 tablet (240 mg total) by mouth daily. 11/21/21  Yes KlDeboraha SprangMD  Accu-Chek FastClix Lancets MISC Use to check blood sugar once daily. E11.9 02/26/21   WrElsie StainMD  Blood Glucose Monitoring Suppl (ACCU-CHEK GUIDE ME) w/Device KIT USE AS DIRECTED 09/19/21  Elsie Stain, MD  glucose blood (ACCU-CHEK GUIDE) test strip USE TO CHECK BLOOD SUGAR ONCE DAILY. 09/19/21   Elsie Stain, MD   Inpatient Medications: Scheduled Meds:  insulin aspart  0-9 Units Subcutaneous TID WC   levothyroxine  25 mcg Oral Q0600   simvastatin  10 mg Oral Daily   sodium chloride flush  3 mL Intravenous Q12H   Continuous Infusions:  sodium chloride     [START ON 01/31/2022] sodium chloride     amiodarone 30 mg/hr (01/30/22 1355)   PRN Meds: sodium chloride, acetaminophen **OR** acetaminophen, albuterol, sodium chloride flush  Allergies:   No Known Allergies  Social History:   Social History   Socioeconomic History   Marital status: Married    Spouse name: Not on file   Number of children: Not on file   Years of education: Not on file   Highest education level: Not on file  Occupational History   Not on file   Tobacco Use   Smoking status: Never   Smokeless tobacco: Never  Vaping Use   Vaping Use: Never used  Substance and Sexual Activity   Alcohol use: Not Currently   Drug use: Not Currently   Sexual activity: Not Currently  Other Topics Concern   Not on file  Social History Narrative   Not on file   Social Determinants of Health   Financial Resource Strain: Not on file  Food Insecurity: Not on file  Transportation Needs: Not on file  Physical Activity: Not on file  Stress: Not on file  Social Connections: Not on file  Intimate Partner Violence: Not on file    Family History:   History reviewed. No pertinent family history.   ROS:  Please see the history of present illness.   All other ROS reviewed and negative.     Physical Exam/Data:   Vitals:   01/30/22 0800 01/30/22 1337 01/30/22 1400 01/30/22 1433  BP:  (!) 129/91    Pulse: (!) 122 (!) 120 (!) 51   Resp: (!) 26 (!) 25 (!) 33   Temp:  98.1 F (36.7 C)    TempSrc:      SpO2: 98%  99%   Weight:    74.9 kg  Height:        Intake/Output Summary (Last 24 hours) at 01/30/2022 1500 Last data filed at 01/30/2022 1200 Gross per 24 hour  Intake 2183.33 ml  Output 2430 ml  Net -246.67 ml      01/30/2022    2:33 PM 01/30/2022    4:54 AM 01/29/2022    4:09 AM  Last 3 Weights  Weight (lbs) 165 lb 2 oz 165 lb 2 oz 164 lb 3.9 oz  Weight (kg) 74.9 kg 74.9 kg 74.5 kg     Body mass index is 26.65 kg/m.   General: Elderly, mild SOB with communication Skin: Warm, dry, intact  Neck: Negative for carotid bruits. No JVD Lungs:Clear to ausculation bilaterally. No wheezes, rales, or rhonchi. Breathing is unlabored. Cardiovascular: Irregularly irregular. Systolic murmurs Abdomen: Soft, non-tender, non-distended. No obvious abdominal masses. Extremities: No edema.  Neuro: Alert and oriented. No focal deficits. No facial asymmetry. MAE spontaneously. Psych: Responds to questions appropriately with normal affect.    EKG:   The EKG was personally reviewed and demonstrates: AF with RVR, HR 148bpm  Telemetry:  Telemetry was personally reviewed and demonstrates: AF with HR's in the 120's   Relevant CV Studies:  Echocardiogram 01/29/22:   1. Left ventricular  ejection fraction, by estimation, is 60 to 65%. The  left ventricle has normal function. The left ventricle has no regional  wall motion abnormalities. There is mild concentric left ventricular  hypertrophy. Left ventricular diastolic  parameters are consistent with Grade II diastolic dysfunction  (pseudonormalization). Elevated left ventricular end-diastolic pressure.   2. Right ventricular systolic function is mildly reduced. The right  ventricular size is normal. There is moderately elevated pulmonary artery  systolic pressure. The estimated right ventricular systolic pressure is  85.9 mmHg.   3. Left atrial size was mildly dilated.   4. Right atrial size was mildly dilated.   5. Pericardial effusion measures 1.51cm posteriorly at greatest  diameter.. Moderate pericardial effusion. The pericardial effusion is  circumferential.   6. The mitral valve is degenerative. Mild to moderate mitral valve  regurgitation. No evidence of mitral stenosis. Moderate mitral annular  calcification.   7. The aortic valve is calcified. There is severe calcifcation of the  aortic valve. There is severe thickening of the aortic valve. Aortic valve  regurgitation is mild. Severe aortic valve stenosis. Aortic regurgitation  PHT measures 485 msec. Aortic  valve area, by VTI measures 0.74 cm. Aortic valve mean gradient measures  42.0 mmHg. Aortic valve Vmax measures 4.05 m/s.   8. The inferior vena cava is normal in size with greater than 50%  respiratory variability, suggesting right atrial pressure of 3 mmHg.   9. Compared to study dated 10/29/2021, the mean AVG has increased from  36.45mg to 473mg, DVI has decreased from 0.35 to 0.24, Vmax has increased  from 3.8843mto  4.47m16mnd AVA has decreased from 0.99cm2 to 0.74cm2 all  consistent with now severe AS.   Limited echocardiogram 01/30/22:  Limited echo but known moderate MR and severe AS. Left ventricular ejection fraction, by estimation, is 55 to 60%. The left ventricle has normal function. 2. 3. Pacing wires in RA/RV. Compared to TTE done yesterday effusion seems larger especially in apical and subcostal views Patient in rapid afib but appears to have more drop in mitral inflows with inspiration and RV diastolic collapse IVC remains dilated over 2 as before Would have concern for tamponade especially if on blood thinners for afib . Large pericardial effusion. The pericardial effusion is circumferential.  Echocardiogram 03/27/2021:  SUMMARY  The left ventricular size is normal.  Mild left ventricular hypertrophy  LV ejection fraction = 55-60%.  Left ventricular systolic function is normal.  The right ventricle is normal in size and function.  The left atrium is moderately dilated.  There is moderate to severe aortic stenosis.  There is moderate to severe mitral annular calcification.  There is mild mitral regurgitation.  The mean gradient across the mitral valve is 3.9 mmHg.  The heart rate for the mean mitral valve gradient is 70 BPM.  There was insufficient TR detected to calculate RV systolic pressure.  The inferior vena cava was not visualized during the exam.  There is no pericardial effusion.  There is no comparison study available     Doppler Measurements & Calculations  MV E max vel:      MV V2 max:       MV dec time: SV(LVOT): 99.3 ml  147.9 cm/sec       153.6 cm/sec     0.30 sec     Ao V2 max:  MV A max vel:      MV max PG:  417.7 cm/sec  142.1 cm/sec       9.4 mmHg                      Ao max PG: 69.8 mmHg  MV E/A: 1.0        MV V2 mean:                   Ao V2 mean:  Med Peak E' Vel:   91.9 cm/sec                   298.3 cm/sec  4.5 cm/sec         MV mean  PG:                   Ao mean PG: 39.5 mmHg  Lat Peak E' Vel:   3.9 mmHg                      Ao V2 VTI: 95.6 cm  4.6 cm/sec         MV V2 VTI:  E/Lat E`: 32.4     50.8 cm                       AVA (VTI): 1.0 cm2  E/Med E`: 33.2                     MVA(VTI): 2.0 cm2   Echocardiogram 10/29/21:   1. The aortic valve is abnormal. There is severe calcifcation of the  aortic valve. Aortic valve regurgitation is mild. Moderate to severe  aortic valve stenosis. Aortic valve area, by VTI measures 0.99 cm. Aortic  valve mean gradient measures 36.5 mmHg.  Aortic valve Vmax measures 3.89 m/s.   2. Left ventricular ejection fraction, by estimation, is 65 to 70%. The  left ventricle has normal function. The left ventricle has no regional  wall motion abnormalities. There is moderate asymmetric left ventricular  hypertrophy of the basal-septal  segment. Left ventricular diastolic parameters are consistent with Grade I  diastolic dysfunction (impaired relaxation).   3. Right ventricular systolic function is normal. The right ventricular  size is normal. There is normal pulmonary artery systolic pressure. The  estimated right ventricular systolic pressure is 52.0 mmHg.   4. Left atrial size was mild to moderately dilated.   5. The mitral valve is degenerative. Mild mitral valve regurgitation. No  evidence of mitral stenosis.   6. The inferior vena cava is normal in size with greater than 50%  respiratory variability, suggesting right atrial pressure of 3 mmHg.   Laboratory Data:  High Sensitivity Troponin:   Recent Labs  Lab 01/16/22 0659 01/16/22 0942 01/28/22 1856 01/28/22 2025  TROPONINIHS 23* 19* 14 13     Chemistry Recent Labs  Lab 01/28/22 1856 01/28/22 1905 01/29/22 0510 01/30/22 0055  NA 137 135 140 138  K 5.8* 4.9 4.5 3.7  CL 106 107 107 114*  CO2 15*  --  21* 20*  GLUCOSE 303* 285* 135* 192*  BUN 22 24* 22 18  CREATININE 1.51* 1.40* 1.50* 1.09  CALCIUM 9.2  --  8.9  8.0*  GFRNONAA 46*  --  46* >60  ANIONGAP 16*  --  12 4*    Recent Labs  Lab 01/28/22 1856 01/29/22 0510 01/30/22 0055  PROT 6.4* 6.0* 5.4*  ALBUMIN 3.5 3.1* 2.8*  AST 34 303* 108*  ALT 12 170* 124*  ALKPHOS 52 69 54  BILITOT 1.5* 0.9 0.6   Hematology Recent Labs  Lab 01/28/22 1856 01/28/22 1905 01/29/22 0510 01/30/22 0055  WBC 11.8*  --  9.6 8.3  RBC 4.68  --  4.36 4.15*  HGB 14.2 13.9 13.7 12.6*  HCT 44.3 41.0 38.9* 38.1*  MCV 94.7  --  89.2 91.8  MCH 30.3  --  31.4 30.4  MCHC 32.1  --  35.2 33.1  RDW 14.0  --  13.9 13.9  PLT 408*  --  287 238   BNP Recent Labs  Lab 01/28/22 2025  BNP 377.5*    DDimer No results for input(s): "DDIMER" in the last 168 hours.   Radiology/Studies:  VAS [place] ABI WITH/WO TBI  Result Date: 01/30/2022  LOWER EXTREMITY DOPPLER STUDY Patient Name:  PRANEETH BUSSEY  Date of Exam:   01/30/2022 Medical Rec #: [id number]      Accession #:    [id number] Date of Birth: 1940-12-07       Patient Gender: M Patient Age:   46 years Exam Location:  [facility] Procedure:      VAS [place] ABI WITH/WO TBI Referring Phys: Regan Lemming --------------------------------------------------------------------------------  Indications: Limb ischemia, left. High Risk Factors: Hypertension, Diabetes, no history of smoking.  Performing Technologist: Bobetta Lime BS RVT  Examination Guidelines: A complete evaluation includes at minimum, Doppler waveform signals and systolic blood pressure reading at the level of bilateral brachial, anterior tibial, and posterior tibial arteries, when vessel segments are accessible. Bilateral testing is considered an integral part of a complete examination. Photoelectric Plethysmograph (PPG) waveforms and toe systolic pressure readings are included as required and additional duplex testing as needed. Limited examinations for reoccurring indications may be performed as noted.  ABI Findings:  +---------+------------------+-----+---------+----------------+ Right    Rt Pressure (mmHg)IndexWaveform Comment          +---------+------------------+-----+---------+----------------+ Brachial 127                                              +---------+------------------+-----+---------+----------------+ PTA      255               1.93 triphasicNon-compressible +---------+------------------+-----+---------+----------------+ DP       255               1.93 triphasicNon-compressible +---------+------------------+-----+---------+----------------+ Great Toe113               0.86                           +---------+------------------+-----+---------+----------------+ +---------+------------------+-----+----------+-------+ Left     Lt Pressure (mmHg)IndexWaveform  Comment +---------+------------------+-----+----------+-------+ Brachial 132                                      +---------+------------------+-----+----------+-------+ PTA      88                0.67 monophasic        +---------+------------------+-----+----------+-------+ DP       89                0.67 monophasic        +---------+------------------+-----+----------+-------+ Great Toe0                 0.00 Absent            +---------+------------------+-----+----------+-------+ +-------+-----------+-----------+------------+------------+  ABI/TBIToday's ABIToday's TBIPrevious ABIPrevious TBI +-------+-----------+-----------+------------+------------+ Right  [place]         0.86                                +-------+-----------+-----------+------------+------------+ Left   0.67       0                                   +-------+-----------+-----------+------------+------------+ The left great toe pressure is deferred due to absent waveform in the left great toe.  Summary: Right: Resting right ankle-brachial index indicates noncompressible right lower extremity arteries. The right  toe-brachial index is normal. Left: Resting left ankle-brachial index indicates moderate left lower extremity arterial disease. The left toe-brachial index is abnormal. *See table(s) above for measurements and observations.     Preliminary    ECHOCARDIOGRAM LIMITED  Result Date: 01/30/2022    ECHOCARDIOGRAM LIMITED REPORT   Patient Name:   Johanthan Kneeland Date of Exam: 01/30/2022 Medical Rec #:  [id number]     Height:       66.0 in Accession #:    [id number]    Weight:       165.1 lb Date of Birth:  September 04, 1940      BSA:          1.843 m Patient Age:    77 years      BP:           129/91 mmHg Patient Gender: M             HR:           120 bpm. Exam Location:  Inpatient Procedure: Limited Echo and Cardiac Doppler Indications:    Pericardial Effusion  History:        Patient has prior history of Echocardiogram examinations.                 Pacemaker, Aortic Valve Disease; Risk Factors:Hypertension,                 Diabetes and Dyslipidemia.  Sonographer:    Meagan Baucom RDCS, FE, PE Referring Phys: [place]  1. Limited echo but known moderate MR and severe AS.  2. Left ventricular ejection fraction, by estimation, is 55 to 60%. The left ventricle has normal function.  3. Pacing wires in RA/RV.  4. Compared to TTE done yesterday effusion seems larger especially in apical and subcostal views Patient in rapid afib but appears to have more drop in mitral inflows with inspiration and RV diastolic collapse IVC remains dilated over 2 as before Would have concern for tamponade especially if on blood thinners for afib . Large pericardial effusion. The pericardial effusion is circumferential. FINDINGS  Left Ventricle: Left ventricular ejection fraction, by estimation, is 55 to 60%. The left ventricle has normal function. The left ventricular internal cavity size was normal in size. Right Ventricle: Pacing wires in RA/RV. Pericardium: Compared to TTE done yesterday effusion seems larger especially in apical and  subcostal views Patient in rapid afib but appears to have more drop in mitral inflows with inspiration and RV diastolic collapse IVC remains dilated over 2 as before Would have concern for tamponade especially if on blood thinners for afib. A large pericardial effusion is present. The pericardial effusion is circumferential. Additional Comments: Limited echo but known moderate MR and severe AS. Collier Salina  Johnsie Cancel MD Electronically signed by Jenkins Rouge MD Signature Date/Time: 01/30/2022/11:44:17 AM    Final    CT HEAD WO CONTRAST (5MM)  Result Date: 01/30/2022 CLINICAL DATA:  Neurological deficit, suspected stroke. History atrial fibrillation, diabetes mellitus, hypertension, stroke EXAM: CT HEAD WITHOUT CONTRAST TECHNIQUE: Contiguous axial images were obtained from the base of the skull through the vertex without intravenous contrast. RADIATION DOSE REDUCTION: This exam was performed according to the departmental dose-optimization program which includes automated exposure control, adjustment of the mA and/or kV according to patient size and/or use of iterative reconstruction technique. COMPARISON:  01/28/2022 FINDINGS: Brain: Generalized atrophy. Normal ventricular morphology. No midline shift or mass effect. Small vessel chronic ischemic changes of deep cerebral white matter. Again identified infarct LEFT periventricular white matter. Area of high attenuation at the RIGHT parietal lobe on previous exam not identified on this con contrast study. No intracranial hemorrhage, mass lesion, or new area of infarction. No extra-axial fluid collections. Vascular: No hyperdense vessels. Atherosclerotic calcifications of internal carotid and vertebral arteries at skull base Skull: Intact Sinuses/Orbits: Clear Other: N/A IMPRESSION: Atrophy with small vessel chronic ischemic changes of deep cerebral white matter. 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 No acute intracranial abnormalities. Area of high attenuation at  RIGHT parietal lobe on previous exam not identified on current noncontrast study, likely represented enhancement of uncertain etiology. Electronically Signed   By: Lavonia Dana M.D.   On: 01/30/2022 08:42   DG CHEST PORT 1 VIEW  Result Date: 01/30/2022 CLINICAL DATA:  81 year old male with history of shortness of breath and pleural effusion. EXAM: PORTABLE CHEST 1 VIEW COMPARISON:  Chest x-ray 01/28/2022. FINDINGS: Skin fold artifact projecting over the lateral right hemithorax incidentally noted. Lung volumes are slightly low. Bibasilar opacities which may reflect areas of atelectasis and/or consolidation. Small bilateral pleural effusions. No pneumothorax. No evidence of pulmonary edema. Heart size is mild heart size is enlarged. Upper mediastinal contours are distorted by patient positioning. Atherosclerotic calcifications in the thoracic aorta. Left-sided pacemaker device in place with lead tips projecting over the expected location of the right atrium and right ventricle. IMPRESSION: 1. Low lung volumes with bibasilar opacities favored to reflect areas of atelectasis (airspace consolidation is not excluded), with superimposed small bilateral pleural effusions. 2. Cardiomegaly. 3. Aortic atherosclerosis. Electronically Signed   By: Vinnie Langton M.D.   On: 01/30/2022 06:27   ECHOCARDIOGRAM COMPLETE  Result Date: 01/29/2022    ECHOCARDIOGRAM REPORT   Patient Name:   LEVELL TAVANO Date of Exam: 01/29/2022 Medical Rec #:  [id number]     Height:       66.0 in Accession #:    [id number]    Weight:       164.2 lb Date of Birth:  07/29/1941      BSA:          1.839 m Patient Age:    70 years      BP:           108/67 mmHg Patient Gender: M             HR:           69 bpm. Exam Location:  Inpatient Procedure: 2D Echo, Cardiac Doppler and Color Doppler STAT ECHO Indications:    Aortic valve disorder  History:        Patient has prior history of Echocardiogram examinations. Risk                  Factors:Dyslipidemia, Diabetes and  Hypertension.  Sonographer:    Jyl Heinz Referring Phys: [id number] Regan Lemming IMPRESSIONS  1. Left ventricular ejection fraction, by estimation, is 60 to 65%. The left ventricle has normal function. The left ventricle has no regional wall motion abnormalities. There is mild concentric left ventricular hypertrophy. Left ventricular diastolic parameters are consistent with Grade II diastolic dysfunction (pseudonormalization). Elevated left ventricular end-diastolic pressure.  2. Right ventricular systolic function is mildly reduced. The right ventricular size is normal. There is moderately elevated pulmonary artery systolic pressure. The estimated right ventricular systolic pressure is 82.7 mmHg.  3. Left atrial size was mildly dilated.  4. Right atrial size was mildly dilated.  5. Pericardial effusion measures 1.51cm posteriorly at greatest diameter.. Moderate pericardial effusion. The pericardial effusion is circumferential.  6. The mitral valve is degenerative. Mild to moderate mitral valve regurgitation. No evidence of mitral stenosis. Moderate mitral annular calcification.  7. The aortic valve is calcified. There is severe calcifcation of the aortic valve. There is severe thickening of the aortic valve. Aortic valve regurgitation is mild. Severe aortic valve stenosis. Aortic regurgitation PHT measures 485 msec. Aortic valve area, by VTI measures 0.74 cm. Aortic valve mean gradient measures 42.0 mmHg. Aortic valve Vmax measures 4.05 m/s.  8. The inferior vena cava is normal in size with greater than 50% respiratory variability, suggesting right atrial pressure of 3 mmHg.  9. Compared to study dated 10/29/2021, the mean AVG has increased from 36.71mg to 463mg, DVI has decreased from 0.35 to 0.24, Vmax has increased from 3.8829mto 4.29m64mnd AVA has decreased from 0.99cm2 to 0.74cm2 all consistent with now severe AS. FINDINGS  Left Ventricle: Left ventricular ejection  fraction, by estimation, is 60 to 65%. The left ventricle has normal function. The left ventricle has no regional wall motion abnormalities. The left ventricular internal cavity size was normal in size. There is  mild concentric left ventricular hypertrophy. Left ventricular diastolic parameters are consistent with Grade II diastolic dysfunction (pseudonormalization). Elevated left ventricular end-diastolic pressure. Right Ventricle: The right ventricular size is normal. No increase in right ventricular wall thickness. Right ventricular systolic function is mildly reduced. There is moderately elevated pulmonary artery systolic pressure. The tricuspid regurgitant velocity is 2.84 m/s, and with an assumed right atrial pressure of 15 mmHg, the estimated right ventricular systolic pressure is 47.307.8g. Left Atrium: Left atrial size was mildly dilated. Right Atrium: Right atrial size was mildly dilated. Pericardium: Pericardial effusion measures 1.51cm posteriorly at greatest diameter. A moderately sized pericardial effusion is present. The pericardial effusion is circumferential. Mitral Valve: The mitral valve is degenerative in appearance. There is mild calcification of the mitral valve leaflet(s). Moderate mitral annular calcification. Mild to moderate mitral valve regurgitation. No evidence of mitral valve stenosis. MV peak gradient, 8.9 mmHg. The mean mitral valve gradient is 3.0 mmHg. Tricuspid Valve: The tricuspid valve is normal in structure. Tricuspid valve regurgitation is mild . No evidence of tricuspid stenosis. Aortic Valve: The aortic valve is calcified. There is severe calcifcation of the aortic valve. There is severe thickening of the aortic valve. Aortic valve regurgitation is mild. Aortic regurgitation PHT measures 485 msec. Severe aortic stenosis is present. Aortic valve mean gradient measures 42.0 mmHg. Aortic valve peak gradient measures 65.6 mmHg. Aortic valve area, by VTI measures 0.74 cm.  Pulmonic Valve: The pulmonic valve was normal in structure. Pulmonic valve regurgitation is not visualized. No evidence of pulmonic stenosis. Aorta: The aortic root is normal in size and structure. Venous: The inferior vena  cava is normal in size with greater than 50% respiratory variability, suggesting right atrial pressure of 3 mmHg. IAS/Shunts: No atrial level shunt detected by color flow Doppler. Additional Comments: A device lead is visualized.  LEFT VENTRICLE PLAX 2D LVIDd:         4.20 cm      Diastology LVIDs:         2.70 cm      LV e' medial:    4.99 cm/s LV PW:         1.20 cm      LV E/e' medial:  29.5 LV IVS:        1.40 cm      LV e' lateral:   4.21 cm/s LVOT diam:     2.00 cm      LV E/e' lateral: 34.9 LV SV:         76 LV SV Index:   41 LVOT Area:     3.14 cm  LV Volumes (MOD) LV vol d, MOD A2C: 90.5 ml LV vol d, MOD A4C: 102.0 ml LV vol s, MOD A2C: 36.1 ml LV vol s, MOD A4C: 38.1 ml LV SV MOD A2C:     54.4 ml LV SV MOD A4C:     102.0 ml LV SV MOD BP:      60.6 ml RIGHT VENTRICLE            IVC RV Basal diam:  3.60 cm    IVC diam: 2.30 cm RV Mid diam:    3.20 cm RV S prime:     7.36 cm/s TAPSE (M-mode): 1.5 cm LEFT ATRIUM             Index        RIGHT ATRIUM           Index LA diam:        4.50 cm 2.45 cm/m   RA Area:     20.40 cm LA Vol (A2C):   51.8 ml 28.16 ml/m  RA Volume:   62.20 ml  33.82 ml/m LA Vol (A4C):   84.5 ml 45.94 ml/m LA Biplane Vol: 72.1 ml 39.20 ml/m  AORTIC VALVE AV Area (Vmax):    0.78 cm AV Area (Vmean):   0.83 cm AV Area (VTI):     0.74 cm AV Vmax:           405.00 cm/s AV Vmean:          290.600 cm/s AV VTI:            1.020 m AV Peak Grad:      65.6 mmHg AV Mean Grad:      42.0 mmHg LVOT Vmax:         100.70 cm/s LVOT Vmean:        77.150 cm/s LVOT VTI:          0.242 m LVOT/AV VTI ratio: 0.24 AI PHT:            485 msec  AORTA Ao Root diam: 2.80 cm Ao Asc diam:  3.00 cm MITRAL VALVE                TRICUSPID VALVE MV Area (PHT): 2.83 cm     TR Peak grad:   32.3  mmHg MV Area VTI:   1.92 cm     TR Vmax:        284.00 cm/s MV Peak grad:  8.9 mmHg MV Mean grad:  3.0 mmHg  SHUNTS MV Vmax:       1.49 m/s     Systemic VTI:  0.24 m MV Vmean:      85.3 cm/s    Systemic Diam: 2.00 cm MV Decel Time: 268 msec MR Peak grad: 138.9 mmHg MR Mean grad: 97.0 mmHg MR Vmax:      589.33 cm/s MR Vmean:     478.0 cm/s MV E velocity: 147.00 cm/s MV A velocity: 121.00 cm/s MV E/A ratio:  1.21 Fransico Him MD Electronically signed by Fransico Him MD Signature Date/Time: 01/29/2022/9:19:21 AM    Final    CT HEAD WO CONTRAST (5MM)  Result Date: 01/28/2022 CLINICAL DATA:  Acute neurological deficit.  Stroke suspected. EXAM: CT HEAD WITHOUT CONTRAST TECHNIQUE: Contiguous axial images were obtained from the base of the skull through the vertex without intravenous contrast. RADIATION DOSE REDUCTION: This exam was performed according to the departmental dose-optimization program which includes automated exposure control, adjustment of the mA and/or kV according to patient size and/or use of iterative reconstruction technique. COMPARISON:  None Available. FINDINGS: Brain: Note that patient received contrast material for a CTA chest earlier this evening, severely limiting the ability to detect intracranial hemorrhage. There is mild diffuse cerebral atrophy. Low-attenuation changes in the deep white matter consistent with small vessel ischemia. 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 There is a focal area of increased density in the right parietal lobe extending to the cortical surface. This is probably an area of contrast enhancement, possibly representing a venous malformation, but this could also represent an area of infarct or acute intraparenchymal hemorrhage. Enhancing tumor is less likely. Consider MRI versus repeat CT in 24 hours after contrast material has washed out. No mass effect or midline shift. No abnormal extra-axial fluid collections. Gray-white matter junctions are distinct.  Vascular: Vascular calcifications.  No aneurysm identified. Skull: Calvarium appears intact. Sinuses/Orbits: Paranasal sinuses and mastoid air cells are clear. Other: None. IMPRESSION: 1. Examination is technically limited due to residual IV contrast material from prior study. 2. Focal area of increased attenuation in the right parietal region. Differential diagnosis includes contrast enhancement in a AVM or less likely mass versus infarct or hemorrhage. Suggest MRI or repeat CT after contrast material has washed out. 3. Chronic atrophy and small vessel ischemic changes. Electronically Signed   By: Lucienne Capers M.D.   On: 01/28/2022 22:45   CT Angio Chest/Abd/Pel for Dissection W and/or Wo Contrast  Result Date: 01/28/2022 CLINICAL DATA:  Acute aortic syndrome (AAS) suspected. Shortness of breath. EXAM: CT ANGIOGRAPHY CHEST, ABDOMEN AND PELVIS TECHNIQUE: Non-contrast CT of the chest was initially obtained. Multidetector CT imaging through the chest, abdomen and pelvis was performed using the standard protocol during bolus administration of intravenous contrast. Multiplanar reconstructed images and MIPs were obtained and reviewed to evaluate the vascular anatomy. RADIATION DOSE REDUCTION: This exam was performed according to the departmental dose-optimization program which includes automated exposure control, adjustment of the mA and/or kV according to patient size and/or use of iterative reconstruction technique. CONTRAST:  244m OMNIPAQUE IOHEXOL 350 MG/ML SOLN COMPARISON:  01/16/2022 FINDINGS: CTA CHEST FINDINGS Cardiovascular: Heart is normal size. Small to moderate pericardial effusion, stable since prior study. Extensive coronary artery calcifications. Moderate aortic calcifications. No evidence of aortic aneurysm or dissection. Mediastinum/Nodes: No mediastinal, hilar, or axillary adenopathy. Trachea and esophagus are unremarkable. Thyroid unremarkable. Lungs/Pleura: Small bilateral pleural effusions,  new since prior study. Bibasilar atelectasis. Musculoskeletal: Left chest wall pacer noted. Chest wall soft tissues are unremarkable.  No acute bony abnormality. Review of the MIP images confirms the above findings. CTA ABDOMEN AND PELVIS FINDINGS VASCULAR Aorta: Diffuse aortic atherosclerosis. No evidence of aortic aneurysm or dissection. Celiac: Widely patent. SMA: Widely patent Renals: Single bilaterally, widely patent IMA: Widely patent Inflow: Atherosclerotic calcifications diffusely. No aneurysm or dissection. Veins: No obvious venous abnormality within the limitations of this arterial phase study. Review of the MIP images confirms the above findings. NON-VASCULAR Hepatobiliary: Prior cholecystectomy.  No focal hepatic abnormality. Pancreas: No focal abnormality or ductal dilatation. There is mild stranding seen adjacent to the pancreatic head and in the adjacent porta hepatis and adjacent to the descending duodenum. Also mild stranding adjacent to the pancreatic tail. This could reflect early acute pancreatitis. Recommend clinical correlation. Spleen: No focal abnormality.  Normal size. Adrenals/Urinary Tract: Adrenal glands unremarkable. Bilateral small renal cysts, appear benign. No follow-up imaging recommended. No hydronephrosis. Urinary bladder decompressed. Stomach/Bowel: Left colonic diverticulosis. No active diverticulitis. Stomach and small bowel decompressed, grossly unremarkable. Lymphatic: No adenopathy Reproductive: No visible focal abnormality. Other: No free fluid or free air. Bilateral inguinal hernias containing fat, left larger than right. Musculoskeletal: No acute bony abnormality. Review of the MIP images confirms the above findings. IMPRESSION: Aortic atherosclerosis. No evidence of aortic aneurysm or dissection. Small bilateral pleural effusions.  Bibasilar atelectasis. Mild stranding/edema noted adjacent to the pancreatic head and pancreatic tail raising the possibility of early acute  pancreatitis. Recommend clinical correlation. Left colonic diverticulosis. Electronically Signed   By: Rolm Baptise M.D.   On: 01/28/2022 20:31   DG Chest Port 1 View  Result Date: 01/28/2022 CLINICAL DATA:  Presyncope EXAM: PORTABLE CHEST 1 VIEW COMPARISON:  None Available. FINDINGS: Mild bibasilar atelectasis or infiltrate. Small right pleural effusion is present. No pneumothorax. Cardiomegaly is stable. 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 Pulmonary vascularity is normal. No acute bone abnormality. IMPRESSION: 1. Mild bibasilar atelectasis or infiltrate. Small right pleural effusion. 2. Stable cardiomegaly. Electronically Signed   By: Fidela Salisbury M.D.   On: 01/28/2022 19:47   CUP PACEART INCLINIC DEVICE CHECK  Result Date: 01/28/2022 Wound check appointment. Steri-strips removed. Wound without redness or edema. Incision edges approximated, wound well healed. Normal device function. RA/RV sensing, and impedances consistent with implant measurements. Unable to run thresholds secondary to RVR. Burden 99.8% with current RVR episode ongoing from 1 day, 10 hours ago. Patient symptomatic complaining of inability to take a deep breath. +OAC. Dr. Caryl Comes in to assess patient and patient added to Dr. Aquilla Hacker schedule today. Device programmed at 3.5V/auto capture programmed on for extra safety margin until 3 month visit. Histogram distribution appropriate for patient and level of activity.   STS Risk Calculator: Procedure: AV Replacement: Risk of Mortality: 2.193% Renal Failure: 2.690% Permanent Stroke: 2.210% Prolonged Ventilation: 6.339% DSW Infection: 0.101% Reoperation: 3.770% Morbidity or Mortality: 10.969% Short Length of Stay: 34.206% Long Length of Stay: 6.113%  [facility] Cardiomyopathy Questionnaire     01/30/2022    1:36 PM  KCCQ-12  1 a. Ability to shower/bathe Moderately limited  1 b. Ability to walk 1 block Moderately limited  1 c. Ability to hurry/jog Other,  Did not do  2. Edema feet/ankles/legs Never over the past 2 weeks  3. Limited by fatigue 1-2 times a week  4. Limited by dyspnea 3+ times a week, not every day  5. Sitting up / on 3+ pillows Less than once a week  6. Limited enjoyment of life Moderately limited  7. Rest of life w/ symptoms  Mostly dissatisfied  8 a. Participation in hobbies Moderately limited  8 b. Participation in chores Moderately limited  8 c. Visiting family/friends Moderately limited    Assessment and Plan:   Jaheim Canino is a 81 y.o. 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 I have reviewed the patient's recent echocardiogram which is notable for normal LV systolic function and severe aortic stenosis with peak gradient of 65.37mHg and mean transvalvular gradient of 42.031mg. The patient's dimensionless index is 0.24 and calculated aortic valve area is 0.74 cm.    I have reviewed the natural history of aortic stenosis with the patient. We have discussed the limitations of medical therapy and the poor prognosis associated with symptomatic aortic stenosis. We have reviewed potential treatment options, including palliative medical therapy, conventional surgical aortic valve replacement, and transcatheter aortic valve replacement. We discussed treatment options in the context of this patient's specific comorbid medical conditions.    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 Once he has been stabilized from a cardiac and fluid volume standpoint, we will seen him back in the outpatient setting to complete his workup with a gated  cardiac CTA and a CTA of the chest/abdomen/pelvis to evaluate both his cardiac anatomy and peripheral vasculature along with R/LHC.    For questions or updates, please contact [facility] consult [email or web address] for contact info under   Signed, MiSherren MochaMD  01/30/2022 3:00 PM   Patient seen, examined. Available data reviewed. Agree with findings, assessment, and plan as outlined by JiKathyrn DrownNP.  The patient is independently interviewed and examined.  His daughter is at the bedside.  He is alert, oriented, elderly male in no distress.  He is short of breath with conversation.  HEENT is normal.  JVP is elevated with positive HJR.  Lungs are clear bilaterally.  Heart is irregularly irregular with grade 3/6 systolic murmur at the right upper sternal border, somewhat distant heart sounds.  Abdomen is soft and nontender.  Extremities have no significant edema.  Skin is warm and dry with no rash.  Neurologic is grossly intact.  The patient's echocardiogram is personally reviewed and shows normal LVEF of 60 to 6500%grade 2 diastolic dysfunction, moderate pericardial effusion, and severe aortic stenosis with a dimensionless index of 0.24, V-max of 3.9, mean gradient of 42 mmHg, and valve area of 0.74 cm.  Between yesterday and today, a limited echo is again performed and shows increase in the patient's pericardial effusion, now circumferential and moderate to large.  We are asked to see the patient regarding his aortic stenosis and potential treatment options.  I think he will likely be a good candidate for TAVR pending further evaluation.  He will require CT angiography studies and right left heart catheterization to better assess the functional anatomy of his aortic valve, relationships to the coronary arteries, and vascular access for TAVR.  However, the more pressing issue is the fact that he is exhibiting signs of cardiac tamponade both clinically and by echo criteria.  The patient  underwent recent pacemaker implantation complicated by microperforation.  He had a lead revision about 2 weeks ago.  There is an increase in the size  of his pericardial effusion in the last 24 hours and he has had intermittent hypotension during his hospital stay.  He remains in atrial fibrillation with RVR.  He reports progressive shortness of breath.  The patient has been anticoagulated I suspect he has a hemorrhagic effusion.  I reviewed treatment options with the patient and his daughter.  While there is some concern over the fact that he is anticoagulated with last dose of apixaban this morning, I do not think we should wait to drain his effusion as there is risk of further hemodynamic compromise especially in the setting of his comorbidities and known severe aortic stenosis.  I have reviewed risks, indications, and alternatives to needle pericardiocentesis with the patient and his daughter.  I think the risk of serious complication such as bleeding, perforation, need for emergency surgery, serious arrhythmia, myocardial infarction, and other life-threatening complication, there is no greater than 2 to 3%.  The patient understands and provides full informed consent.  Case discussed with Dr. Angelena Form who will do the procedure.  Sherren Mocha, M.D. 01/30/2022 3:00 PM

## 2022-01-30 NOTE — Progress Notes (Signed)
Patient called out complaining of orthopnea, on assessment, there are inspiratory/expiratory wheezes with fine crackles at the bases.   He was assisted into the chair and coached on pursed lip breathing. [facility] paged.      01/30/22 0600  Charting Type  Charting Type Full Reassessment Changes Noted  Focused Reassessment No Changes Respiratory  Respiratory  Cough Non-productive  Respiratory Pattern Labored;Orthopnea  Chest Assessment Chest expansion symmetrical  R Lower Breath Sounds Diminished  L Lower Breath Sounds Diminished  Bilateral Breath Sounds Inspiratory wheezes;Expiratory wheezes  R Upper  Breath Sounds Inspiratory wheezes  L Upper Breath Sounds Inspiratory wheezes  ECG Monitoring  ECG Heart Rate 93

## 2022-01-30 NOTE — Interval H&P Note (Signed)
History and Physical Interval Note:  01/30/2022 3:48 PM  Edward Butler  has presented today for surgery, with the diagnosis of pericardiocentesis.  The various methods of treatment have been discussed with the patient and family. After consideration of risks, benefits and other options for treatment, the patient has consented to  Procedure(s): PERICARDIOCENTESIS (N/A) as a surgical intervention.  The patient's history has been reviewed, patient examined, no change in status, stable for surgery.  I have reviewed the patient's chart and labs.  Questions were answered to the patient's satisfaction.     Lauree Chandler

## 2022-01-30 NOTE — Plan of Care (Signed)
Spoke with interventional about his effusion. Echo meets tamponade criteria. He is stable and responded to fluids. He does not have a good site to tap. Further he is on eliquis which increases his risk.Plan to tx with fluids if hypotensive. If effusion increases over the weekend, can consider and/or he is not responsive to fluids in an emergent setting. Will stop eliquis now to lower risk.

## 2022-01-31 ENCOUNTER — Inpatient Hospital Stay (HOSPITAL_COMMUNITY): Payer: Medicare HMO

## 2022-01-31 DIAGNOSIS — R571 Hypovolemic shock: Secondary | ICD-10-CM | POA: Diagnosis not present

## 2022-01-31 DIAGNOSIS — I3139 Other pericardial effusion (noninflammatory): Secondary | ICD-10-CM | POA: Diagnosis not present

## 2022-01-31 DIAGNOSIS — I480 Paroxysmal atrial fibrillation: Secondary | ICD-10-CM | POA: Diagnosis not present

## 2022-01-31 DIAGNOSIS — I314 Cardiac tamponade: Secondary | ICD-10-CM | POA: Diagnosis not present

## 2022-01-31 DIAGNOSIS — I5032 Chronic diastolic (congestive) heart failure: Secondary | ICD-10-CM

## 2022-01-31 LAB — COMPREHENSIVE METABOLIC PANEL
ALT: 85 U/L — ABNORMAL HIGH (ref 0–44)
ALT: 67 U/L — ABNORMAL HIGH (ref 0–44)
AST: 35 U/L (ref 15–41)
AST: 27 U/L (ref 15–41)
Albumin: 2.9 g/dL — ABNORMAL LOW (ref 3.5–5.0)
Albumin: 2.8 g/dL — ABNORMAL LOW (ref 3.5–5.0)
Alkaline Phosphatase: 60 U/L (ref 38–126)
Alkaline Phosphatase: 59 U/L (ref 38–126)
Anion gap: 8 (ref 5–15)
Anion gap: 10 (ref 5–15)
BUN: 11 mg/dL (ref 8–23)
BUN: 11 mg/dL (ref 8–23)
CO2: 22 mmol/L (ref 22–32)
CO2: 24 mmol/L (ref 22–32)
Calcium: 8.8 mg/dL — ABNORMAL LOW (ref 8.9–10.3)
Calcium: 9 mg/dL (ref 8.9–10.3)
Chloride: 109 mmol/L (ref 98–111)
Chloride: 106 mmol/L (ref 98–111)
Creatinine, Ser: 0.96 mg/dL (ref 0.61–1.24)
Creatinine, Ser: 0.98 mg/dL (ref 0.61–1.24)
GFR, Estimated: >60 mL/min (ref >60)
GFR, Estimated: >60 mL/min (ref >60)
Glucose, Bld: 177 mg/dL — ABNORMAL HIGH (ref 70–99)
Glucose, Bld: 108 mg/dL — ABNORMAL HIGH (ref 70–99)
Potassium: 3.6 mmol/L (ref 3.5–5.1)
Potassium: 4 mmol/L (ref 3.5–5.1)
Sodium: 139 mmol/L (ref 135–145)
Sodium: 140 mmol/L (ref 135–145)
Total Bilirubin: 1.2 mg/dL (ref 0.3–1.2)
Total Bilirubin: 1.2 mg/dL (ref 0.3–1.2)
Total Protein: 5.9 g/dL — ABNORMAL LOW (ref 6.5–8.1)
Total Protein: 5.9 g/dL — ABNORMAL LOW (ref 6.5–8.1)

## 2022-01-31 LAB — CBC WITH DIFFERENTIAL/PLATELET
Abs Immature Granulocytes: 0.04 10*3/uL (ref 0.00–0.07)
Basophils Absolute: 0 10*3/uL (ref 0.0–0.1)
Basophils Relative: 0 %
Eosinophils Absolute: 0.2 10*3/uL (ref 0.0–0.5)
Eosinophils Relative: 3 %
HCT: 41.5 % (ref 39.0–52.0)
Hemoglobin: 14.5 g/dL (ref 13.0–17.0)
Immature Granulocytes: 1 %
Lymphocytes Relative: 19 %
Lymphs Abs: 1.4 10*3/uL (ref 0.7–4.0)
MCH: 30.8 pg (ref 26.0–34.0)
MCHC: 34.9 g/dL (ref 30.0–36.0)
MCV: 88.1 fL (ref 80.0–100.0)
Monocytes Absolute: 0.6 10*3/uL (ref 0.1–1.0)
Monocytes Relative: 8 %
Neutro Abs: 5.2 10*3/uL (ref 1.7–7.7)
Neutrophils Relative %: 69 %
Platelets: 274 10*3/uL (ref 150–400)
RBC: 4.71 MIL/uL (ref 4.22–5.81)
RDW: 13.6 % (ref 11.5–15.5)
WBC: 7.5 10*3/uL (ref 4.0–10.5)
nRBC: 0 % (ref 0.0–0.2)

## 2022-01-31 LAB — ECHOCARDIOGRAM LIMITED
Calc EF: 56.4 %
Height: 66 in
Single Plane A2C EF: 51.1 %
Single Plane A4C EF: 58.3 %
Weight: 2518.54 oz

## 2022-01-31 LAB — GLUCOSE, CAPILLARY
Glucose-Capillary: 184 mg/dL — ABNORMAL HIGH (ref 70–99)
Glucose-Capillary: 239 mg/dL — ABNORMAL HIGH (ref 70–99)
Glucose-Capillary: 207 mg/dL — ABNORMAL HIGH (ref 70–99)
Glucose-Capillary: 122 mg/dL — ABNORMAL HIGH (ref 70–99)

## 2022-01-31 LAB — PROTEIN, BODY FLUID (OTHER): Total Protein, Body Fluid Other: 3.9 g/dL

## 2022-01-31 LAB — LD, BODY FLUID (OTHER): LD, Body Fluid: 1968 IU/L

## 2022-01-31 LAB — MAGNESIUM
Magnesium: 1.7 mg/dL (ref 1.7–2.4)
Magnesium: 1.8 mg/dL (ref 1.7–2.4)

## 2022-01-31 LAB — GLUCOSE, BODY FLUID OTHER: Glucose, Body Fluid Other: 134 mg/dL

## 2022-01-31 MED ORDER — LORAZEPAM 0.5 MG PO TABS
0.5000 mg | ORAL_TABLET | Freq: Three times a day (TID) | ORAL | Status: DC | PRN
Start: 2022-01-31 — End: 2022-02-04
  Administered 2022-02-01: 0.5 mg via ORAL
  Filled 2022-01-31: qty 1

## 2022-01-31 MED ORDER — INSULIN ASPART 100 UNIT/ML IJ SOLN
0.0000 [IU] | Freq: Every day | INTRAMUSCULAR | Status: DC
  Administered 2022-02-01: 2 [IU] via SUBCUTANEOUS

## 2022-01-31 MED ORDER — ZOLPIDEM TARTRATE 5 MG PO TABS
5.0000 mg | ORAL_TABLET | Freq: Every evening | ORAL | Status: DC | PRN
Start: 2022-01-31 — End: 2022-02-04

## 2022-01-31 MED ORDER — FUROSEMIDE 10 MG/ML IJ SOLN
20.0000 mg | Freq: Every day | INTRAMUSCULAR | Status: DC
  Administered 2022-01-31 – 2022-02-01 (×2): 20 mg via INTRAVENOUS
  Filled 2022-01-31 (×3): qty 2

## 2022-01-31 MED ORDER — POTASSIUM CHLORIDE CRYS ER 20 MEQ PO TBCR
40.0000 meq | EXTENDED_RELEASE_TABLET | Freq: Once | ORAL | Status: AC
Start: 2022-01-31 — End: 2022-01-31
  Administered 2022-01-31: 40 meq via ORAL
  Filled 2022-01-31: qty 2

## 2022-01-31 MED ORDER — MELATONIN 5 MG PO TABS
10.0000 mg | ORAL_TABLET | Freq: Every day | ORAL | Status: DC
  Administered 2022-01-31 – 2022-02-02 (×2): 10 mg via ORAL
  Filled 2022-01-31 (×3): qty 2

## 2022-01-31 MED ORDER — ENSURE ENLIVE PO LIQD
237.0000 mL | Freq: Two times a day (BID) | ORAL | Status: DC
  Administered 2022-02-01 – 2022-02-04 (×7): 237 mL via ORAL

## 2022-01-31 MED ORDER — LEVALBUTEROL HCL 0.63 MG/3ML IN NEBU: 0.6300 mg | INHALATION_SOLUTION | Freq: Four times a day (QID) | RESPIRATORY_TRACT | Status: DC | PRN

## 2022-01-31 MED ORDER — SIMETHICONE 80 MG PO CHEW
80.0000 mg | CHEWABLE_TABLET | Freq: Four times a day (QID) | ORAL | Status: DC
  Administered 2022-01-31 – 2022-02-04 (×14): 80 mg via ORAL
  Filled 2022-01-31 (×15): qty 1

## 2022-01-31 MED ORDER — SENNOSIDES-DOCUSATE SODIUM 8.6-50 MG PO TABS
1.0000 | ORAL_TABLET | Freq: Two times a day (BID) | ORAL | Status: DC
  Administered 2022-01-31 – 2022-02-04 (×6): 1 via ORAL
  Filled 2022-01-31 (×7): qty 1

## 2022-01-31 MED ORDER — DILTIAZEM HCL 60 MG PO TABS
30.0000 mg | ORAL_TABLET | Freq: Four times a day (QID) | ORAL | Status: DC
  Administered 2022-01-31 – 2022-02-01 (×4): 30 mg via ORAL
  Filled 2022-01-31 (×4): qty 1

## 2022-01-31 MED ORDER — INSULIN ASPART 100 UNIT/ML IJ SOLN
0.0000 [IU] | Freq: Three times a day (TID) | INTRAMUSCULAR | Status: DC
  Administered 2022-01-31 (×2): 5 [IU] via SUBCUTANEOUS
  Administered 2022-02-01: 3 [IU] via SUBCUTANEOUS
  Administered 2022-02-01: 2 [IU] via SUBCUTANEOUS
  Administered 2022-02-01: 3 [IU] via SUBCUTANEOUS
  Administered 2022-02-02: 8 [IU] via SUBCUTANEOUS
  Administered 2022-02-02: 3 [IU] via SUBCUTANEOUS
  Administered 2022-02-03: 11 [IU] via SUBCUTANEOUS
  Administered 2022-02-03 – 2022-02-04 (×2): 3 [IU] via SUBCUTANEOUS
  Administered 2022-02-04: 5 [IU] via SUBCUTANEOUS

## 2022-01-31 MED ORDER — BISACODYL 10 MG RE SUPP: 10.0000 mg | Freq: Every day | RECTAL | Status: DC | PRN

## 2022-01-31 NOTE — Progress Notes (Signed)
Pt. unable to sleep after PRN melatonin. Repeatedly on call bell pt. states "I feel like I have to focus on every breath" and "I am afraid that each breath may be my last". Pt. endorses anxiety, states that after talking with nurse, he is less focused on his breathing. Vital signs are consistent with those from earlier in shift.  Pericardial drain in place and no signs of tamponade. Provided reassurance, offered distractions, and offered to contact pt. family. Coached on breathing slowly through nose, and offered emotional support. Pt. watching television as a distraction, will continue to monitor.

## 2022-01-31 NOTE — Progress Notes (Addendum)
Pt declined CPAP at this time stating he does not tolerate our mask due to use of nasal pillows at home.

## 2022-01-31 NOTE — Progress Notes (Signed)
Progress Note  Patient Name: Edward Butler Date of Encounter: 01/31/2022  [facility] Cardiologist: Dina Rich, MD   Subjective   No CP; complains of some dyspnea  Inpatient Medications    Scheduled Meds:  Chlorhexidine Gluconate Cloth  6 each Topical Daily   insulin aspart  0-9 Units Subcutaneous TID WC   levothyroxine  25 mcg Oral Q0600   simvastatin  10 mg Oral Daily   sodium chloride flush  3 mL Intravenous Q12H   sodium chloride flush  3 mL Intravenous Q12H   Continuous Infusions:  sodium chloride     amiodarone 30 mg/hr (01/31/22 0500)   PRN Meds: sodium chloride, acetaminophen **OR** acetaminophen, albuterol, melatonin, mouth rinse, sodium chloride flush   Vital Signs    Vitals:   01/31/22 0530 01/31/22 0600 01/31/22 0630 01/31/22 0700  BP: (!) 123/92 130/77 (!) 111/93 (!) 124/105  Pulse: (!) 115 (!) 119 (!) 119 (!) 121  Resp: (!) 34 (!) 34 (!) 32 (!) 41  Temp:      TempSrc:      SpO2: 98% 96% 96% 98%  Weight:      Height:        Intake/Output Summary (Last 24 hours) at 01/31/2022 0806 Last data filed at 01/31/2022 0500 Gross per 24 hour  Intake 912.38 ml  Output 1155 ml  Net -242.62 ml      01/31/2022    5:00 AM 01/30/2022    2:33 PM 01/30/2022    4:54 AM  Last 3 Weights  Weight (lbs) 157 lb 6.5 oz 165 lb 2 oz 165 lb 2 oz  Weight (kg) 71.4 kg 74.9 kg 74.9 kg      Telemetry    Atrial fibrillation rate elevated - Personally Reviewed   Physical Exam   GEN: No acute distress.   Neck: supple Cardiac: irregular and tachycardic Respiratory: Diminished breath sounds bases; pericardial drain in place. GI: Soft, nontender, non-distended  MS: No edema Neuro:  Nonfocal  Psych: Normal affect   Labs    High Sensitivity Troponin:   Recent Labs  Lab 01/16/22 0659 01/16/22 0942 01/28/22 1856 01/28/22 2025  TROPONINIHS 23* 19* 14 13     Chemistry Recent Labs  Lab 01/29/22 0510 01/30/22 0055 01/31/22 0358  NA 140 138 139  K 4.5 3.7 3.6   CL 107 114* 109  CO2 21* 20* 22  GLUCOSE 135* 192* 177*  BUN '22 18 11  '$ CREATININE 1.50* 1.09 0.96  CALCIUM 8.9 8.0* 8.8*  MG 2.1 1.8 1.7  PROT 6.0* 5.4* 5.9*  ALBUMIN 3.1* 2.8* 2.9*  AST 303* 108* 35  ALT 170* 124* 85*  ALKPHOS 69 54 60  BILITOT 0.9 0.6 1.2  GFRNONAA 46* >60 >60  ANIONGAP 12 4* 8   Hematology Recent Labs  Lab 01/29/22 0510 01/30/22 0055 01/31/22 0358  WBC 9.6 8.3 7.5  RBC 4.36 4.15* 4.71  HGB 13.7 12.6* 14.5  HCT 38.9* 38.1* 41.5  MCV 89.2 91.8 88.1  MCH 31.4 30.4 30.8  MCHC 35.2 33.1 34.9  RDW 13.9 13.9 13.6  PLT 287 238 274   Thyroid  Recent Labs  Lab 01/29/22 1235  TSH 1.947    BNP Recent Labs  Lab 01/28/22 2025  BNP 377.5*     Radiology    ECHOCARDIOGRAM LIMITED  Result Date: 01/30/2022    ECHOCARDIOGRAM LIMITED REPORT   Patient Name:   Edward Butler Date of Exam: 01/30/2022 Medical Rec #:  [id number]     Height:  66.0 in Accession #:    [id number]    Weight:       165.1 lb Date of Birth:  11/27/1940      BSA:          1.843 m Patient Age:    48 years      BP:           147/99 mmHg Patient Gender: M             HR:           117 bpm. Exam Location:  Inpatient Procedure: Limited Echo Indications:    pericardiocentesis  History:        Patient has prior history of Echocardiogram examinations, most                 recent 01/30/2022. Arrythmias:Atrial Fibrillation; Risk                 Factors:Diabetes, Hypertension and Dyslipidemia.  Sonographer:    Johny Chess RDCS Referring Phys: [place]  1. Limited study with 3 off axis apical images Post pericardiocentesis with improvement in moderate effusion seen on TTE earlier today Only trival effusion remaining with improved RV diastolic expansion. FINDINGS  Additional Comments: Limited study with 3 off axis apical images Post pericardiocentesis with improvement in moderate effusion seen on TTE earlier today Only trival effusion remaining with improved RV diastolic  expansion. Jenkins Rouge MD Electronically signed by Jenkins Rouge MD Signature Date/Time: 01/30/2022/4:56:24 PM    Final    CARDIAC CATHETERIZATION  Result Date: 01/30/2022 Pericardial tamponade Successful pericardiocentesis with removal of 510 cc of bloody fluid Will monitor in the ICU overnight with drain in place. If output is down tomorrow, OK to remove in the morning.   VAS [place] ABI WITH/WO TBI  Result Date: 01/30/2022  LOWER EXTREMITY DOPPLER STUDY Patient Name:  Edward Butler  Date of Exam:   01/30/2022 Medical Rec #: [id number]      Accession #:    [id number] Date of Birth: 09-09-1940       Patient Gender: M Patient Age:   53 years Exam Location:  [facility] Procedure:      VAS [place] ABI WITH/WO TBI Referring Phys: Regan Lemming --------------------------------------------------------------------------------  Indications: Limb ischemia, left. High Risk Factors: Hypertension, Diabetes, no history of smoking.  Performing Technologist: Bobetta Lime BS RVT  Examination Guidelines: A complete evaluation includes at minimum, Doppler waveform signals and systolic blood pressure reading at the level of bilateral brachial, anterior tibial, and posterior tibial arteries, when vessel segments are accessible. Bilateral testing is considered an integral part of a complete examination. Photoelectric Plethysmograph (PPG) waveforms and toe systolic pressure readings are included as required and additional duplex testing as needed. Limited examinations for reoccurring indications may be performed as noted.  ABI Findings: +---------+------------------+-----+---------+----------------+ Right    Rt Pressure (mmHg)IndexWaveform Comment          +---------+------------------+-----+---------+----------------+ Brachial 127                                              +---------+------------------+-----+---------+----------------+ PTA      255               1.93 triphasicNon-compressible  +---------+------------------+-----+---------+----------------+ DP       255  1.93 triphasicNon-compressible +---------+------------------+-----+---------+----------------+ Doristine Devoid Toe113               0.86                           +---------+------------------+-----+---------+----------------+ +---------+------------------+-----+----------+-------+ Left     Lt Pressure (mmHg)IndexWaveform  Comment +---------+------------------+-----+----------+-------+ Brachial 132                                      +---------+------------------+-----+----------+-------+ PTA      88                0.67 monophasic        +---------+------------------+-----+----------+-------+ DP       89                0.67 monophasic        +---------+------------------+-----+----------+-------+ Great Toe0                 0.00 Absent            +---------+------------------+-----+----------+-------+ +-------+-----------+-----------+------------+------------+ ABI/TBIToday's ABIToday's TBIPrevious ABIPrevious TBI +-------+-----------+-----------+------------+------------+ Right  [place]         0.86                                +-------+-----------+-----------+------------+------------+ Left   0.67       0                                   +-------+-----------+-----------+------------+------------+ The left great toe pressure is deferred due to absent waveform in the left great toe.  Summary: Right: Resting right ankle-brachial index indicates noncompressible right lower extremity arteries. The right toe-brachial index is normal. Left: Resting left ankle-brachial index indicates moderate left lower extremity arterial disease. The left toe-brachial index is abnormal. *See table(s) above for measurements and observations.     Preliminary    ECHOCARDIOGRAM LIMITED  Result Date: 01/30/2022    ECHOCARDIOGRAM LIMITED REPORT   Patient Name:   Edward Butler Date of Exam: 01/30/2022  Medical Rec #:  [id number]     Height:       66.0 in Accession #:    [id number]    Weight:       165.1 lb Date of Birth:  11-Apr-1941      BSA:          1.843 m Patient Age:    89 years      BP:           129/91 mmHg Patient Gender: M             HR:           120 bpm. Exam Location:  Inpatient Procedure: Limited Echo and Cardiac Doppler Indications:    Pericardial Effusion  History:        Patient has prior history of Echocardiogram examinations.                 Pacemaker, Aortic Valve Disease; Risk Factors:Hypertension,                 Diabetes and Dyslipidemia.  Sonographer:    Meagan Baucom RDCS, FE, PE Referring Phys: [place]  1. Limited echo but known moderate MR and severe AS.  2. Left  ventricular ejection fraction, by estimation, is 55 to 60%. The left ventricle has normal function.  3. Pacing wires in RA/RV.  4. Compared to TTE done yesterday effusion seems larger especially in apical and subcostal views Patient in rapid afib but appears to have more drop in mitral inflows with inspiration and RV diastolic collapse IVC remains dilated over 2 as before Would have concern for tamponade especially if on blood thinners for afib . Large pericardial effusion. The pericardial effusion is circumferential. FINDINGS  Left Ventricle: Left ventricular ejection fraction, by estimation, is 55 to 60%. The left ventricle has normal function. The left ventricular internal cavity size was normal in size. Right Ventricle: Pacing wires in RA/RV. Pericardium: Compared to TTE done yesterday effusion seems larger especially in apical and subcostal views Patient in rapid afib but appears to have more drop in mitral inflows with inspiration and RV diastolic collapse IVC remains dilated over 2 as before Would have concern for tamponade especially if on blood thinners for afib. A large pericardial effusion is present. The pericardial effusion is circumferential. Additional Comments: Limited echo but known moderate MR  and severe AS. Jenkins Rouge MD Electronically signed by Jenkins Rouge MD Signature Date/Time: 01/30/2022/11:44:17 AM    Final    CT HEAD WO CONTRAST (5MM)  Result Date: 01/30/2022 CLINICAL DATA:  Neurological deficit, suspected stroke. History atrial fibrillation, diabetes mellitus, hypertension, stroke EXAM: CT HEAD WITHOUT CONTRAST TECHNIQUE: Contiguous axial images were obtained from the base of the skull through the vertex without intravenous contrast. RADIATION DOSE REDUCTION: This exam was performed according to the departmental dose-optimization program which includes automated exposure control, adjustment of the mA and/or kV according to patient size and/or use of iterative reconstruction technique. COMPARISON:  01/28/2022 FINDINGS: Brain: Generalized atrophy. Normal ventricular morphology. No midline shift or mass effect. Small vessel chronic ischemic changes of deep cerebral white matter. Again identified infarct LEFT periventricular white matter. Area of high attenuation at the RIGHT parietal lobe on previous exam not identified on this con contrast study. No intracranial hemorrhage, mass lesion, or new area of infarction. No extra-axial fluid collections. Vascular: No hyperdense vessels. Atherosclerotic calcifications of internal carotid and vertebral arteries at skull base Skull: Intact Sinuses/Orbits: Clear Other: N/A IMPRESSION: Atrophy with small vessel chronic ischemic changes of deep cerebral white matter. 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 No acute intracranial abnormalities. Area of high attenuation at RIGHT parietal lobe on previous exam not identified on current noncontrast study, likely represented enhancement of uncertain etiology. Electronically Signed   By: Lavonia Dana M.D.   On: 01/30/2022 08:42   DG CHEST PORT 1 VIEW  Result Date: 01/30/2022 CLINICAL DATA:  81 year old male with history of shortness of breath and pleural effusion. EXAM: PORTABLE CHEST 1 VIEW COMPARISON:   Chest x-ray 01/28/2022. FINDINGS: Skin fold artifact projecting over the lateral right hemithorax incidentally noted. Lung volumes are slightly low. Bibasilar opacities which may reflect areas of atelectasis and/or consolidation. Small bilateral pleural effusions. No pneumothorax. No evidence of pulmonary edema. Heart size is mild heart size is enlarged. Upper mediastinal contours are distorted by patient positioning. Atherosclerotic calcifications in the thoracic aorta. Left-sided pacemaker device in place with lead tips projecting over the expected location of the right atrium and right ventricle. IMPRESSION: 1. Low lung volumes with bibasilar opacities favored to reflect areas of atelectasis (airspace consolidation is not excluded), with superimposed small bilateral pleural effusions. 2. Cardiomegaly. 3. Aortic atherosclerosis. Electronically Signed   By: Mauri Brooklyn.D.  On: 01/30/2022 06:27   ECHOCARDIOGRAM COMPLETE  Result Date: 01/29/2022    ECHOCARDIOGRAM REPORT   Patient Name:   Edward Butler Date of Exam: 01/29/2022 Medical Rec #:  [id number]     Height:       66.0 in Accession #:    [id number]    Weight:       164.2 lb Date of Birth:  October 13, 1940      BSA:          1.839 m Patient Age:    78 years      BP:           108/67 mmHg Patient Gender: M             HR:           69 bpm. Exam Location:  Inpatient Procedure: 2D Echo, Cardiac Doppler and Color Doppler STAT ECHO Indications:    Aortic valve disorder  History:        Patient has prior history of Echocardiogram examinations. Risk                 Factors:Dyslipidemia, Diabetes and Hypertension.  Sonographer:    Jyl Heinz Referring Phys: [id number] Regan Lemming IMPRESSIONS  1. Left ventricular ejection fraction, by estimation, is 60 to 65%. The left ventricle has normal function. The left ventricle has no regional wall motion abnormalities. There is mild concentric left ventricular hypertrophy. Left ventricular diastolic parameters are consistent  with Grade II diastolic dysfunction (pseudonormalization). Elevated left ventricular end-diastolic pressure.  2. Right ventricular systolic function is mildly reduced. The right ventricular size is normal. There is moderately elevated pulmonary artery systolic pressure. The estimated right ventricular systolic pressure is 07.6 mmHg.  3. Left atrial size was mildly dilated.  4. Right atrial size was mildly dilated.  5. Pericardial effusion measures 1.51cm posteriorly at greatest diameter.. Moderate pericardial effusion. The pericardial effusion is circumferential.  6. The mitral valve is degenerative. Mild to moderate mitral valve regurgitation. No evidence of mitral stenosis. Moderate mitral annular calcification.  7. The aortic valve is calcified. There is severe calcifcation of the aortic valve. There is severe thickening of the aortic valve. Aortic valve regurgitation is mild. Severe aortic valve stenosis. Aortic regurgitation PHT measures 485 msec. Aortic valve area, by VTI measures 0.74 cm. Aortic valve mean gradient measures 42.0 mmHg. Aortic valve Vmax measures 4.05 m/s.  8. The inferior vena cava is normal in size with greater than 50% respiratory variability, suggesting right atrial pressure of 3 mmHg.  9. Compared to study dated 10/29/2021, the mean AVG has increased from 36.86mg to 429mg, DVI has decreased from 0.35 to 0.24, Vmax has increased from 3.88102mto 4.43m73mnd AVA has decreased from 0.99cm2 to 0.74cm2 all consistent with now severe AS. FINDINGS  Left Ventricle: Left ventricular ejection fraction, by estimation, is 60 to 65%. The left ventricle has normal function. The left ventricle has no regional wall motion abnormalities. The left ventricular internal cavity size was normal in size. There is  mild concentric left ventricular hypertrophy. Left ventricular diastolic parameters are consistent with Grade II diastolic dysfunction (pseudonormalization). Elevated left ventricular end-diastolic  pressure. Right Ventricle: The right ventricular size is normal. No increase in right ventricular wall thickness. Right ventricular systolic function is mildly reduced. There is moderately elevated pulmonary artery systolic pressure. The tricuspid regurgitant velocity is 2.84 m/s, and with an assumed right atrial pressure of 15 mmHg, the estimated right ventricular systolic pressure is 47.322.6g. Left Atrium: Left  atrial size was mildly dilated. Right Atrium: Right atrial size was mildly dilated. Pericardium: Pericardial effusion measures 1.51cm posteriorly at greatest diameter. A moderately sized pericardial effusion is present. The pericardial effusion is circumferential. Mitral Valve: The mitral valve is degenerative in appearance. There is mild calcification of the mitral valve leaflet(s). Moderate mitral annular calcification. Mild to moderate mitral valve regurgitation. No evidence of mitral valve stenosis. MV peak gradient, 8.9 mmHg. The mean mitral valve gradient is 3.0 mmHg. Tricuspid Valve: The tricuspid valve is normal in structure. Tricuspid valve regurgitation is mild . No evidence of tricuspid stenosis. Aortic Valve: The aortic valve is calcified. There is severe calcifcation of the aortic valve. There is severe thickening of the aortic valve. Aortic valve regurgitation is mild. Aortic regurgitation PHT measures 485 msec. Severe aortic stenosis is present. Aortic valve mean gradient measures 42.0 mmHg. Aortic valve peak gradient measures 65.6 mmHg. Aortic valve area, by VTI measures 0.74 cm. Pulmonic Valve: The pulmonic valve was normal in structure. Pulmonic valve regurgitation is not visualized. No evidence of pulmonic stenosis. Aorta: The aortic root is normal in size and structure. Venous: The inferior vena cava is normal in size with greater than 50% respiratory variability, suggesting right atrial pressure of 3 mmHg. IAS/Shunts: No atrial level shunt detected by color flow Doppler. Additional  Comments: A device lead is visualized.  LEFT VENTRICLE PLAX 2D LVIDd:         4.20 cm      Diastology LVIDs:         2.70 cm      LV e' medial:    4.99 cm/s LV PW:         1.20 cm      LV E/e' medial:  29.5 LV IVS:        1.40 cm      LV e' lateral:   4.21 cm/s LVOT diam:     2.00 cm      LV E/e' lateral: 34.9 LV SV:         76 LV SV Index:   41 LVOT Area:     3.14 cm  LV Volumes (MOD) LV vol d, MOD A2C: 90.5 ml LV vol d, MOD A4C: 102.0 ml LV vol s, MOD A2C: 36.1 ml LV vol s, MOD A4C: 38.1 ml LV SV MOD A2C:     54.4 ml LV SV MOD A4C:     102.0 ml LV SV MOD BP:      60.6 ml RIGHT VENTRICLE            IVC RV Basal diam:  3.60 cm    IVC diam: 2.30 cm RV Mid diam:    3.20 cm RV S prime:     7.36 cm/s TAPSE (M-mode): 1.5 cm LEFT ATRIUM             Index        RIGHT ATRIUM           Index LA diam:        4.50 cm 2.45 cm/m   RA Area:     20.40 cm LA Vol (A2C):   51.8 ml 28.16 ml/m  RA Volume:   62.20 ml  33.82 ml/m LA Vol (A4C):   84.5 ml 45.94 ml/m LA Biplane Vol: 72.1 ml 39.20 ml/m  AORTIC VALVE AV Area (Vmax):    0.78 cm AV Area (Vmean):   0.83 cm AV Area (VTI):     0.74 cm AV Vmax:  405.00 cm/s AV Vmean:          290.600 cm/s AV VTI:            1.020 m AV Peak Grad:      65.6 mmHg AV Mean Grad:      42.0 mmHg LVOT Vmax:         100.70 cm/s LVOT Vmean:        77.150 cm/s LVOT VTI:          0.242 m LVOT/AV VTI ratio: 0.24 AI PHT:            485 msec  AORTA Ao Root diam: 2.80 cm Ao Asc diam:  3.00 cm MITRAL VALVE                TRICUSPID VALVE MV Area (PHT): 2.83 cm     TR Peak grad:   32.3 mmHg MV Area VTI:   1.92 cm     TR Vmax:        284.00 cm/s MV Peak grad:  8.9 mmHg MV Mean grad:  3.0 mmHg     SHUNTS MV Vmax:       1.49 m/s     Systemic VTI:  0.24 m MV Vmean:      85.3 cm/s    Systemic Diam: 2.00 cm MV Decel Time: 268 msec MR Peak grad: 138.9 mmHg MR Mean grad: 97.0 mmHg MR Vmax:      589.33 cm/s MR Vmean:     478.0 cm/s MV E velocity: 147.00 cm/s MV A velocity: 121.00 cm/s MV E/A ratio:  1.21  Fransico Him MD Electronically signed by Fransico Him MD Signature Date/Time: 01/29/2022/9:19:21 AM    Final      Patient Profile     81 y.o. male with past medical history of paroxysmal atrial fibrillation, previous pacemaker insertion January 15, 8675 complicated by microperforation of the atrial lead status post revision June 16, aortic stenosis, chronic diastolic congestive heart failure, hypertension, hyperlipidemia, diabetes mellitus, obstructive sleep apnea admitted with CHF and hypotension.  Also noted to be in atrial fibrillation with rapid ventricular response.  Echocardiogram June 29 showed normal LV function, mild biatrial enlargement, moderate circumferential pericardial effusion, mild to moderate mitral regurgitation, severe aortic stenosis, mild aortic insufficiency.  Follow-up echocardiogram showed enlarging pericardial effusion with features concerning for tamponade.  Patient subsequently underwent pericardiocentesis.  Follow-up echocardiogram showed trivial effusion.  Assessment & Plan    1 pericardial effusion-follow-up echocardiogram showed tamponade physiology.  Effusion likely secondary to microperforation at time of previous pacemaker.  He is now status post pericardiocentesis with improvement though he does describe some dyspnea.  We will keep pericardial drain in place today and removed tomorrow if stable.  Plan limited follow-up echocardiogram this morning to reassess effusion.  2 atrial fibrillation with rapid ventricular response-continue amiodarone.  Add Cardizem 30 mg every 6 hours.  Apixaban on hold until after he recovers from recent pericardial effusion/pericardiocentesis.  3 severe aortic stenosis-patient has been seen by the structural heart team and plan is to follow-up as an outpatient for further management once above issues improved.  4 acute on chronic diastolic congestive heart failure-some excess volume today.  We will gently diuresed with IV Lasix.  5  status post pacemaker-pacemaker site without evidence of infection.  For questions or updates, please contact [place] Please consult [email or web address] for contact info under        Signed, Kirk Ruths, MD  01/31/2022, 8:06 AM

## 2022-01-31 NOTE — Progress Notes (Signed)
  Echocardiogram 2D Echocardiogram has been performed.  Edward Butler 01/31/2022, 12:23 PM

## 2022-01-31 NOTE — Progress Notes (Addendum)
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 Recently has undergone pacemaker placement followed by lead revision due to microperforation complication.  On follow-up visit found to have A-fib and was started on metoprolol outpatient.  After 2 doses of metoprolol as well as Lasix he started having diaphoresis lightheadedness and decided to come to the [facility] further work-up.  Found to have hypovolemic shock treated with IV fluids. Cardiology following.  Echocardiogram shows moderate pericardial effusion. 6/30 patient developed A-fib with RVR and started on amiodarone drip.  Repeat echocardiogram shows worsening pericardial effusion.  Patient underwent pericardiocentesis with removal of 510 mL of bloody fluid.  Currently transferred to ICU under cardiology service. [facility] will continue to follow as a consult for medical management.   Assessment and Plan: Anxiety. I will add Ativan 3 times daily as needed 0.5 mg.  OSA. CPAP continue on nightly basis. Patient can use home CPAP machine.  AKI. Now has resolved.  Transaminitis. LFT have normalized.  Insomnia. Continue melatonin, make it scheduled.  Add Ambien as needed.  Type 2 diabetes mellitus with hyperglycemia without complication without long-term insulin use. Continue sliding scale, switch from sensitive to moderate. Check hemoglobin A1c tomorrow. Hold metformin Change to heart and carb modified diet.  Paroxysmal A-fib with RVR. Management per cardiology. Currently on amiodarone drip,  Cardizem added. No anticoagulation as the patient had hemorrhagic pericardial effusion.  Shortness of breath. 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 Management per cardiology. Currently receiving IV Lasix. Switching from albuterol to Xopenex as needed in the setting of RVR.  Hypothyroidism. Continue Synthroid. TSH normal.  Pericardial tamponade. Currently has pericardial drain.  Management per cardiology.  Severe aortic stenosis. Patient was delivered by structural heart team.  Outpatient work-up planned.  Hypovolemic shock. Lactic acidosis. Resolved.  Left lower leg numbness. Resolved. CT of the head unremarkable.  HLD. Continue statin.  BPH. Currently only on finasteride. Continue.  Constipation. We will initiate bowel regimen.  We will continue to follow the patient.    Subjective: Reports severe anxiety last night.  Reports that he goes to sleep but he wakes up within 5 to 6 seconds short shortness of breath and with some panic attack. Denies any chest pain denies any abdominal pain no nausea no vomiting.  Passing gas.  Oral intake adequate.  No headache no dizziness.  Objective: General: Appear in mild distress, no Rash; Oral Mucosa Clear, moist. no Abnormal Neck Mass Or lumps, Conjunctiva normal  Cardiovascular: S1 and S2 Present, no Murmur, Respiratory: good respiratory effort, Bilateral Air entry present and bilateral Crackles, no wheezes Abdomen: Bowel Sound present, Soft and no tenderness Extremities: trace Pedal edema Neurology: alert and oriented to time, place, and person affect anxious. no new focal deficit Gait not checked due to patient safety concerns   Family Communication: no family was present at bedside, at the time of interview.   Data Reviewed: I have Reviewed nursing notes, Vitals, and Lab results since pt's last encounter. Pertinent lab results CBC and CMP I have ordered test including CBC and BMP    Author: Berle Mull, MD  01/31/2022  10:18 AM  To reach On-call, see care teams to locate the attending and reach out to them via [email or web address]. If 7PM-7AM, please contact night-coverage If you still have difficulty reaching the attending provider, please page the [facility] (Director on Call) for [facility] on amion for assistance.

## 2022-02-01 ENCOUNTER — Inpatient Hospital Stay (HOSPITAL_COMMUNITY): Payer: Medicare HMO

## 2022-02-01 ENCOUNTER — Other Ambulatory Visit: Payer: Self-pay

## 2022-02-01 DIAGNOSIS — R571 Hypovolemic shock: Secondary | ICD-10-CM | POA: Diagnosis not present

## 2022-02-01 DIAGNOSIS — I350 Nonrheumatic aortic (valve) stenosis: Secondary | ICD-10-CM | POA: Diagnosis not present

## 2022-02-01 DIAGNOSIS — I5032 Chronic diastolic (congestive) heart failure: Secondary | ICD-10-CM | POA: Diagnosis not present

## 2022-02-01 DIAGNOSIS — I480 Paroxysmal atrial fibrillation: Secondary | ICD-10-CM | POA: Diagnosis not present

## 2022-02-01 DIAGNOSIS — I314 Cardiac tamponade: Secondary | ICD-10-CM | POA: Diagnosis not present

## 2022-02-01 DIAGNOSIS — I3139 Other pericardial effusion (noninflammatory): Secondary | ICD-10-CM

## 2022-02-01 DIAGNOSIS — Z950 Presence of cardiac pacemaker: Secondary | ICD-10-CM

## 2022-02-01 DIAGNOSIS — I4891 Unspecified atrial fibrillation: Secondary | ICD-10-CM

## 2022-02-01 DIAGNOSIS — I5033 Acute on chronic diastolic (congestive) heart failure: Secondary | ICD-10-CM

## 2022-02-01 LAB — HEMOGLOBIN A1C
Hgb A1c MFr Bld: 5.9 % — ABNORMAL HIGH (ref 4.8–5.6)
Mean Plasma Glucose: 122.63 mg/dL

## 2022-02-01 LAB — COMPREHENSIVE METABOLIC PANEL
ALT: 63 U/L — ABNORMAL HIGH (ref 0–44)
AST: 27 U/L (ref 15–41)
Albumin: 2.6 g/dL — ABNORMAL LOW (ref 3.5–5.0)
Alkaline Phosphatase: 57 U/L (ref 38–126)
Anion gap: 11 (ref 5–15)
BUN: 10 mg/dL (ref 8–23)
CO2: 23 mmol/L (ref 22–32)
Calcium: 8.9 mg/dL (ref 8.9–10.3)
Chloride: 105 mmol/L (ref 98–111)
Creatinine, Ser: 0.99 mg/dL (ref 0.61–1.24)
GFR, Estimated: >60 mL/min (ref >60)
Glucose, Bld: 132 mg/dL — ABNORMAL HIGH (ref 70–99)
Potassium: 3.7 mmol/L (ref 3.5–5.1)
Sodium: 139 mmol/L (ref 135–145)
Total Bilirubin: 0.8 mg/dL (ref 0.3–1.2)
Total Protein: 5.8 g/dL — ABNORMAL LOW (ref 6.5–8.1)

## 2022-02-01 LAB — CBC WITH DIFFERENTIAL/PLATELET
Abs Immature Granulocytes: 0.03 10*3/uL (ref 0.00–0.07)
Basophils Absolute: 0 10*3/uL (ref 0.0–0.1)
Basophils Relative: 0 %
Eosinophils Absolute: 0.3 10*3/uL (ref 0.0–0.5)
Eosinophils Relative: 3 %
HCT: 43.2 % (ref 39.0–52.0)
Hemoglobin: 14.6 g/dL (ref 13.0–17.0)
Immature Granulocytes: 0 %
Lymphocytes Relative: 23 %
Lymphs Abs: 1.8 10*3/uL (ref 0.7–4.0)
MCH: 29.9 pg (ref 26.0–34.0)
MCHC: 33.8 g/dL (ref 30.0–36.0)
MCV: 88.5 fL (ref 80.0–100.0)
Monocytes Absolute: 0.7 10*3/uL (ref 0.1–1.0)
Monocytes Relative: 9 %
Neutro Abs: 5 10*3/uL (ref 1.7–7.7)
Neutrophils Relative %: 65 %
Platelets: 322 10*3/uL (ref 150–400)
RBC: 4.88 MIL/uL (ref 4.22–5.81)
RDW: 13.4 % (ref 11.5–15.5)
WBC: 7.7 10*3/uL (ref 4.0–10.5)
nRBC: 0 % (ref 0.0–0.2)

## 2022-02-01 LAB — GLUCOSE, CAPILLARY
Glucose-Capillary: 178 mg/dL — ABNORMAL HIGH (ref 70–99)
Glucose-Capillary: 200 mg/dL — ABNORMAL HIGH (ref 70–99)
Glucose-Capillary: 141 mg/dL — ABNORMAL HIGH (ref 70–99)
Glucose-Capillary: 234 mg/dL — ABNORMAL HIGH (ref 70–99)

## 2022-02-01 LAB — MAGNESIUM: Magnesium: 1.8 mg/dL (ref 1.7–2.4)

## 2022-02-01 MED ORDER — DILTIAZEM HCL 60 MG PO TABS
60.0000 mg | ORAL_TABLET | Freq: Four times a day (QID) | ORAL | Status: DC
  Administered 2022-02-01 – 2022-02-04 (×11): 60 mg via ORAL
  Filled 2022-02-01 (×12): qty 1

## 2022-02-01 NOTE — Progress Notes (Signed)
[facility] PROGRESS NOTE  Patient: Edward Butler BDZ:[id number]   PCP: Elsie Stain, MD DOB: Sep 28, 1940   DOA: 01/28/2022   DOS: 02/01/2022    Subjective: Anxiety better.  No nausea no vomiting.  Constipation resolved.  No abdominal pain.  Did not use CPAP last night and is waiting to get 1 from his home today.  Heart rate remains elevated.  Objective:  Vitals:   02/01/22 1630 02/01/22 1700  BP: 120/84   Pulse:    Resp: (!) 29   Temp:    SpO2:  96%    S1-S2 present. Clear to auscultation. Bowel sounds present.  No tenderness. Trace edema  Assessment and plan: Anxiety Continue Ativan 3 times daily as needed 0.5 mg.  OSA. CPAP continue on nightly basis. Patient can use home CPAP machine.  AKI. Now has resolved.  Transaminitis. LFT have almost normalized.  Insomnia. Continue melatonin, make it scheduled.  Add Ambien as needed.  Type 2 diabetes mellitus with hyperglycemia without complication without long-term insulin use. Continue sliding scale, switch from sensitive to moderate. hemoglobin A1c 5.9.  Unchanged from 2 months ago. Hold metformin Change to heart and carb modified diet.   Cardiac tamponade/hemorrhagic pericardial effusion Severe arctic stenosis. Acute on chronic diastolic CHF Paroxysmal A-fib with RVR. Hypovolemic shock like picture. Management per cardiology.  Author: Berle Mull, MD [facility] 02/01/2022 6:27 PM   If 7PM-7AM, please contact night-coverage at [email or web address]

## 2022-02-01 NOTE — Progress Notes (Signed)
Pt has home CPAP.  Assisted with getting CPAP machine ready, water added to chamber.

## 2022-02-01 NOTE — Progress Notes (Signed)
Progress Note  Patient Name: Edward Butler Date of Encounter: 02/01/2022  [facility] Cardiologist: Dina Rich, MD   Subjective   Denies CP or dyspnea  Inpatient Medications    Scheduled Meds:  Chlorhexidine Gluconate Cloth  6 each Topical Daily   diltiazem  30 mg Oral Q6H   feeding supplement  237 mL Oral BID BM   furosemide  20 mg Intravenous Daily   insulin aspart  0-15 Units Subcutaneous TID WC   insulin aspart  0-5 Units Subcutaneous QHS   levothyroxine  25 mcg Oral Q0600   melatonin  10 mg Oral QHS   senna-docusate  1 tablet Oral BID   simethicone  80 mg Oral QID   simvastatin  10 mg Oral Daily   sodium chloride flush  3 mL Intravenous Q12H   sodium chloride flush  3 mL Intravenous Q12H   Continuous Infusions:  sodium chloride     amiodarone 30 mg/hr (02/01/22 0600)   PRN Meds: sodium chloride, acetaminophen **OR** acetaminophen, bisacodyl, levalbuterol, LORazepam, mouth rinse, sodium chloride flush, zolpidem   Vital Signs    Vitals:   02/01/22 0300 02/01/22 0400 02/01/22 0500 02/01/22 0545  BP: 107/67 109/83 122/82 (!) 131/93  Pulse: (!) 115 (!) 114 (!) 108 (!) 115  Resp: (!) 33 (!) 30 (!) 27 (!) 32  Temp:  (!) 97.4 F (36.3 C)    TempSrc:  Oral    SpO2: 97% 95% 96% 97%  Weight:    71.7 kg  Height:        Intake/Output Summary (Last 24 hours) at 02/01/2022 0757 Last data filed at 02/01/2022 0600 Gross per 24 hour  Intake 895.21 ml  Output 1135 ml  Net -239.79 ml       02/01/2022    5:45 AM 01/31/2022    5:00 AM 01/30/2022    2:33 PM  Last 3 Weights  Weight (lbs) 158 lb 1.1 oz 157 lb 6.5 oz 165 lb 2 oz  Weight (kg) 71.7 kg 71.4 kg 74.9 kg      Telemetry    Atrial fibrillation rate elevated - Personally Reviewed   Physical Exam   GEN: NAD Neck: No JVD Cardiac: irregular and tachycardic, no gallop Respiratory: Diminished breath sounds bases; no wheeze GI: Soft, NT/ND MS: No edema Neuro:  Grossly intact Psych: Normal affect   Labs     High Sensitivity Troponin:   Recent Labs  Lab 01/16/22 0659 01/16/22 0942 01/28/22 1856 01/28/22 2025  TROPONINIHS 23* 19* 14 13      Chemistry Recent Labs  Lab 01/31/22 0358 01/31/22 2041 02/01/22 0019  NA 139 140 139  K 3.6 4.0 3.7  CL 109 106 105  CO2 '22 24 23  '$ GLUCOSE 177* 108* 132*  BUN '11 11 10  '$ CREATININE 0.96 0.98 0.99  CALCIUM 8.8* 9.0 8.9  MG 1.7 1.8 1.8  PROT 5.9* 5.9* 5.8*  ALBUMIN 2.9* 2.8* 2.6*  AST 35 27 27  ALT 85* 67* 63*  ALKPHOS 60 59 57  BILITOT 1.2 1.2 0.8  GFRNONAA >60 >60 >60  ANIONGAP '8 10 11    '$ Hematology Recent Labs  Lab 01/30/22 0055 01/31/22 0358 02/01/22 0019  WBC 8.3 7.5 7.7  RBC 4.15* 4.71 4.88  HGB 12.6* 14.5 14.6  HCT 38.1* 41.5 43.2  MCV 91.8 88.1 88.5  MCH 30.4 30.8 29.9  MCHC 33.1 34.9 33.8  RDW 13.9 13.6 13.4  PLT 238 274 322    Thyroid  Recent Labs  Lab 01/29/22 1235  TSH 1.947     BNP Recent Labs  Lab 01/28/22 2025  BNP 377.5*      Radiology    ECHOCARDIOGRAM LIMITED  Result Date: 01/31/2022    ECHOCARDIOGRAM LIMITED REPORT   Patient Name:   Edward Butler Date of Exam: 01/31/2022 Medical Rec #:  [id number]     Height:       66.0 in Accession #:    [id number]    Weight:       157.4 lb Date of Birth:  04-Mar-1941      BSA:          1.806 m Patient Age:    81 years      BP:           138/24 mmHg Patient Gender: M             HR:           121 bpm. Exam Location:  Inpatient Procedure: Limited Echo Indications:    I31.3 Pericardial effusion (noninflammatory)  History:        Patient has prior history of Echocardiogram examinations, most                 recent 01/30/2022. Abnormal ECG, Aortic Valve Disease and Mitral                 Valve Disease, Arrythmias:Atrial Fibrillation, Tachycardia and                 Bradycardia; Risk Factors:Hypertension, Diabetes, Dyslipidemia                 and Sleep Apnea. Cardiac tamponade. Severe aortic stenosis.                 Moderate MR.  Sonographer:    Roseanna Rainbow RDCS Referring  Phys: Lelon Perla  Sonographer Comments: Technically difficult study due to poor echo windows. Limited echo for pericardial effusion. Pericardial drain in subc region. IMPRESSIONS  1. Limited study to FU pericardial effusion; trivial effusion noted.  2. Left ventricular ejection fraction, by estimation, is 60 to 65%. The left ventricle has normal function. The left ventricle has no regional wall motion abnormalities.  3. Right ventricular systolic function is normal. The right ventricular size is normal.  4. Left atrial size was mildly dilated.  5. The inferior vena cava is normal in size with greater than 50% respiratory variability, suggesting right atrial pressure of 3 mmHg. FINDINGS  Left Ventricle: Left ventricular ejection fraction, by estimation, is 60 to 65%. The left ventricle has normal function. The left ventricle has no regional wall motion abnormalities. The left ventricular internal cavity size was normal in size. Right Ventricle: The right ventricular size is normal. Right ventricular systolic function is normal. Left Atrium: Left atrial size was mildly dilated. Right Atrium: Right atrial size was normal in size. Pericardium: Trivial pericardial effusion is present. Aorta: The aortic root is normal in size and structure. Venous: The inferior vena cava is normal in size with greater than 50% respiratory variability, suggesting right atrial pressure of 3 mmHg. Additional Comments: Limited study to FU pericardial effusion; trivial effusion noted. A device lead is visualized.  LV Volumes (MOD) LV vol d, MOD A2C: 44.0 ml LV vol d, MOD A4C: 36.2 ml LV vol s, MOD A2C: 21.5 ml LV vol s, MOD A4C: 15.1 ml LV SV MOD A2C:     22.5 ml LV SV MOD A4C:     36.2  ml LV SV MOD BP:      23.3 ml IVC IVC diam: 2.00 cm Kirk Ruths MD Electronically signed by Kirk Ruths MD Signature Date/Time: 01/31/2022/12:33:55 PM    Final    VAS [place] ABI WITH/WO TBI  Result Date: 01/31/2022  LOWER EXTREMITY DOPPLER STUDY  Patient Name:  Edward Butler  Date of Exam:   01/30/2022 Medical Rec #: [id number]      Accession #:    [id number] Date of Birth: 10/11/1940       Patient Gender: M Patient Age:   85 years Exam Location:  [facility] Procedure:      VAS [place] ABI WITH/WO TBI Referring Phys: Regan Lemming --------------------------------------------------------------------------------  Indications: Limb ischemia, left. High Risk Factors: Hypertension, Diabetes, no history of smoking.  Performing Technologist: Bobetta Lime BS RVT  Examination Guidelines: A complete evaluation includes at minimum, Doppler waveform signals and systolic blood pressure reading at the level of bilateral brachial, anterior tibial, and posterior tibial arteries, when vessel segments are accessible. Bilateral testing is considered an integral part of a complete examination. Photoelectric Plethysmograph (PPG) waveforms and toe systolic pressure readings are included as required and additional duplex testing as needed. Limited examinations for reoccurring indications may be performed as noted.  ABI Findings: +---------+------------------+-----+---------+----------------+ Right    Rt Pressure (mmHg)IndexWaveform Comment          +---------+------------------+-----+---------+----------------+ Brachial 127                                              +---------+------------------+-----+---------+----------------+ PTA      255               1.93 triphasicNon-compressible +---------+------------------+-----+---------+----------------+ DP       255               1.93 triphasicNon-compressible +---------+------------------+-----+---------+----------------+ Great Toe113               0.86                           +---------+------------------+-----+---------+----------------+ +---------+------------------+-----+----------+-------+ Left     Lt Pressure (mmHg)IndexWaveform  Comment  +---------+------------------+-----+----------+-------+ Brachial 132                                      +---------+------------------+-----+----------+-------+ PTA      88                0.67 monophasic        +---------+------------------+-----+----------+-------+ DP       89                0.67 monophasic        +---------+------------------+-----+----------+-------+ Great Toe0                 0.00 Absent            +---------+------------------+-----+----------+-------+ +-------+-----------+-----------+------------+------------+ ABI/TBIToday's ABIToday's TBIPrevious ABIPrevious TBI +-------+-----------+-----------+------------+------------+ Right  [place]         0.86                                +-------+-----------+-----------+------------+------------+ Left   0.67       0                                   +-------+-----------+-----------+------------+------------+  The left great toe pressure is deferred due to absent waveform in the left great toe.  Summary: Right: Resting right ankle-brachial index indicates noncompressible right lower extremity arteries. The right toe-brachial index is normal. Left: Resting left ankle-brachial index indicates moderate left lower extremity arterial disease. The left toe-brachial index is abnormal. *See table(s) above for measurements and observations.  Electronically signed by Jamelle Haring on 01/31/2022 at 9:42:08 AM.    Final    ECHOCARDIOGRAM LIMITED  Result Date: 01/30/2022    ECHOCARDIOGRAM LIMITED REPORT   Patient Name:   JAYTHAN HINELY Date of Exam: 01/30/2022 Medical Rec #:  [id number]     Height:       66.0 in Accession #:    [id number]    Weight:       165.1 lb Date of Birth:  July 22, 1941      BSA:          1.843 m Patient Age:    48 years      BP:           147/99 mmHg Patient Gender: M             HR:           117 bpm. Exam Location:  Inpatient Procedure: Limited Echo Indications:    pericardiocentesis  History:         Patient has prior history of Echocardiogram examinations, most                 recent 01/30/2022. Arrythmias:Atrial Fibrillation; Risk                 Factors:Diabetes, Hypertension and Dyslipidemia.  Sonographer:    Johny Chess RDCS Referring Phys: [place]  1. Limited study with 3 off axis apical images Post pericardiocentesis with improvement in moderate effusion seen on TTE earlier today Only trival effusion remaining with improved RV diastolic expansion. FINDINGS  Additional Comments: Limited study with 3 off axis apical images Post pericardiocentesis with improvement in moderate effusion seen on TTE earlier today Only trival effusion remaining with improved RV diastolic expansion. Jenkins Rouge MD Electronically signed by Jenkins Rouge MD Signature Date/Time: 01/30/2022/4:56:24 PM    Final    CARDIAC CATHETERIZATION  Result Date: 01/30/2022 Pericardial tamponade Successful pericardiocentesis with removal of 510 cc of bloody fluid Will monitor in the ICU overnight with drain in place. If output is down tomorrow, OK to remove in the morning.   ECHOCARDIOGRAM LIMITED  Result Date: 01/30/2022    ECHOCARDIOGRAM LIMITED REPORT   Patient Name:   Jayland Null Date of Exam: 01/30/2022 Medical Rec #:  [id number]     Height:       66.0 in Accession #:    [id number]    Weight:       165.1 lb Date of Birth:  05-Jul-1941      BSA:          1.843 m Patient Age:    19 years      BP:           129/91 mmHg Patient Gender: M             HR:           120 bpm. Exam Location:  Inpatient Procedure: Limited Echo and Cardiac Doppler Indications:    Pericardial Effusion  History:        Patient has prior history of Echocardiogram examinations.  Pacemaker, Aortic Valve Disease; Risk Factors:Hypertension,                 Diabetes and Dyslipidemia.  Sonographer:    Meagan Baucom RDCS, FE, PE Referring Phys: [place]  1. Limited echo but known moderate MR and severe AS.   2. Left ventricular ejection fraction, by estimation, is 55 to 60%. The left ventricle has normal function.  3. Pacing wires in RA/RV.  4. Compared to TTE done yesterday effusion seems larger especially in apical and subcostal views Patient in rapid afib but appears to have more drop in mitral inflows with inspiration and RV diastolic collapse IVC remains dilated over 2 as before Would have concern for tamponade especially if on blood thinners for afib . Large pericardial effusion. The pericardial effusion is circumferential. FINDINGS  Left Ventricle: Left ventricular ejection fraction, by estimation, is 55 to 60%. The left ventricle has normal function. The left ventricular internal cavity size was normal in size. Right Ventricle: Pacing wires in RA/RV. Pericardium: Compared to TTE done yesterday effusion seems larger especially in apical and subcostal views Patient in rapid afib but appears to have more drop in mitral inflows with inspiration and RV diastolic collapse IVC remains dilated over 2 as before Would have concern for tamponade especially if on blood thinners for afib. A large pericardial effusion is present. The pericardial effusion is circumferential. Additional Comments: Limited echo but known moderate MR and severe AS. Jenkins Rouge MD Electronically signed by Jenkins Rouge MD Signature Date/Time: 01/30/2022/11:44:17 AM    Final    CT HEAD WO CONTRAST (5MM)  Result Date: 01/30/2022 CLINICAL DATA:  Neurological deficit, suspected stroke. History atrial fibrillation, diabetes mellitus, hypertension, stroke EXAM: CT HEAD WITHOUT CONTRAST TECHNIQUE: Contiguous axial images were obtained from the base of the skull through the vertex without intravenous contrast. RADIATION DOSE REDUCTION: This exam was performed according to the departmental dose-optimization program which includes automated exposure control, adjustment of the mA and/or kV according to patient size and/or use of iterative reconstruction  technique. COMPARISON:  01/28/2022 FINDINGS: Brain: Generalized atrophy. Normal ventricular morphology. No midline shift or mass effect. Small vessel chronic ischemic changes of deep cerebral white matter. Again identified infarct LEFT periventricular white matter. Area of high attenuation at the RIGHT parietal lobe on previous exam not identified on this con contrast study. No intracranial hemorrhage, mass lesion, or new area of infarction. No extra-axial fluid collections. Vascular: No hyperdense vessels. Atherosclerotic calcifications of internal carotid and vertebral arteries at skull base Skull: Intact Sinuses/Orbits: Clear Other: N/A IMPRESSION: Atrophy with small vessel chronic ischemic changes of deep cerebral white matter. 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 No acute intracranial abnormalities. Area of high attenuation at RIGHT parietal lobe on previous exam not identified on current noncontrast study, likely represented enhancement of uncertain etiology. Electronically Signed   By: Lavonia Dana M.D.   On: 01/30/2022 08:42     Patient Profile     81 y.o. male with past medical history of paroxysmal atrial fibrillation, previous pacemaker insertion January 15, 5808 complicated by microperforation of the atrial lead status post revision June 16, aortic stenosis, chronic diastolic congestive heart failure, hypertension, hyperlipidemia, diabetes mellitus, obstructive sleep apnea admitted with CHF and hypotension.  Also noted to be in atrial fibrillation with rapid ventricular response.  Echocardiogram June 29 showed normal LV function, mild biatrial enlargement, moderate circumferential pericardial effusion, mild to moderate mitral regurgitation, severe aortic stenosis, mild aortic insufficiency.  Follow-up echocardiogram showed enlarging  pericardial effusion with features concerning for tamponade.  Patient subsequently underwent pericardiocentesis.  Follow-up echocardiogram showed trivial  effusion.  Assessment & Plan    1 pericardial effusion-patient is status post pericardiocentesis for pericardial effusion with tamponade physiology.  Follow-up echocardiogram yesterday showed trivial effusion.  No significant drainage overnight.  We will remove drain today.    2 atrial fibrillation with rapid ventricular response-continue amiodarone.  Increase Cardizem to 60 mg every 6 hours as heart rate remains elevated.  We will plan to resume apixaban later once he recovers from pericardiocentesis.  3 severe aortic stenosis-patient has been seen by the structural heart team and plan is to follow-up as an outpatient for further management once above issues improved.  4 acute on chronic diastolic congestive heart failure-continue gentle diuresis and follow renal function.  5 status post pacemaker-pacemaker site without evidence of infection.  For questions or updates, please contact [place] Please consult [email or web address] for contact info under        Signed, Kirk Ruths, MD  02/01/2022, 7:57 AM

## 2022-02-02 ENCOUNTER — Encounter (HOSPITAL_COMMUNITY): Payer: Self-pay | Admitting: Cardiovascular Disease

## 2022-02-02 DIAGNOSIS — R571 Hypovolemic shock: Secondary | ICD-10-CM | POA: Diagnosis not present

## 2022-02-02 LAB — GLUCOSE, CAPILLARY
Glucose-Capillary: 158 mg/dL — ABNORMAL HIGH (ref 70–99)
Glucose-Capillary: 275 mg/dL — ABNORMAL HIGH (ref 70–99)
Glucose-Capillary: 259 mg/dL — ABNORMAL HIGH (ref 70–99)
Glucose-Capillary: 89 mg/dL (ref 70–99)
Glucose-Capillary: 167 mg/dL — ABNORMAL HIGH (ref 70–99)

## 2022-02-02 LAB — CBC
HCT: 42.5 % (ref 39.0–52.0)
Hemoglobin: 14.4 g/dL (ref 13.0–17.0)
MCH: 30.1 pg (ref 26.0–34.0)
MCHC: 33.9 g/dL (ref 30.0–36.0)
MCV: 88.7 fL (ref 80.0–100.0)
Platelets: 287 10*3/uL (ref 150–400)
RBC: 4.79 MIL/uL (ref 4.22–5.81)
RDW: 13.5 % (ref 11.5–15.5)
WBC: 7.3 10*3/uL (ref 4.0–10.5)
nRBC: 0 % (ref 0.0–0.2)

## 2022-02-02 LAB — BASIC METABOLIC PANEL
Anion gap: 10 (ref 5–15)
BUN: 15 mg/dL (ref 8–23)
CO2: 21 mmol/L — ABNORMAL LOW (ref 22–32)
Calcium: 8.8 mg/dL — ABNORMAL LOW (ref 8.9–10.3)
Chloride: 108 mmol/L (ref 98–111)
Creatinine, Ser: 1.09 mg/dL (ref 0.61–1.24)
GFR, Estimated: >60 mL/min (ref >60)
Glucose, Bld: 181 mg/dL — ABNORMAL HIGH (ref 70–99)
Potassium: 3.6 mmol/L (ref 3.5–5.1)
Sodium: 139 mmol/L (ref 135–145)

## 2022-02-02 LAB — CYTOLOGY - NON PAP

## 2022-02-02 LAB — LIPOPROTEIN A (LPA): Lipoprotein (a): 20.6 nmol/L (ref <75.0)

## 2022-02-02 MED ORDER — AMIODARONE HCL 200 MG PO TABS
400.0000 mg | ORAL_TABLET | Freq: Two times a day (BID) | ORAL | Status: DC
  Administered 2022-02-02 – 2022-02-04 (×4): 400 mg via ORAL
  Filled 2022-02-02 (×4): qty 2

## 2022-02-02 MED ORDER — FUROSEMIDE 10 MG/ML IJ SOLN
60.0000 mg | Freq: Every day | INTRAMUSCULAR | Status: DC
Start: 2022-02-02 — End: 2022-02-03
  Administered 2022-02-02: 60 mg via INTRAVENOUS
  Filled 2022-02-02 (×2): qty 6

## 2022-02-02 NOTE — Progress Notes (Signed)
Patient brought to 4E from [place]. Daughters present. VSS. Telemetry box applied, CCMD notified. Patient oriented to room and staff. Call bell in reach.  Daymon Larsen, RN

## 2022-02-02 NOTE — Progress Notes (Signed)
RT NOTE:  Pt has home CPAP @ bedside that he manages.

## 2022-02-02 NOTE — Plan of Care (Signed)
  Problem: Education: Goal: Ability to describe self-care measures that may prevent or decrease complications (Diabetes Survival Skills Education) will improve Outcome: Progressing Goal: Individualized Educational Video(s) Outcome: Not Progressing   Problem: Coping: Goal: Ability to adjust to condition or change in health will improve Outcome: Progressing   Problem: Fluid Volume: Goal: Ability to maintain a balanced intake and output will improve Outcome: Progressing   Problem: Health Behavior/Discharge Planning: Goal: Ability to identify and utilize available resources and services will improve Outcome: Progressing Goal: Ability to manage health-related needs will improve Outcome: Progressing   Problem: Metabolic: Goal: Ability to maintain appropriate glucose levels will improve Outcome: Progressing   Problem: Nutritional: Goal: Maintenance of adequate nutrition will improve Outcome: Progressing Goal: Progress toward achieving an optimal weight will improve Outcome: Progressing   Problem: Skin Integrity: Goal: Risk for impaired skin integrity will decrease Outcome: Progressing   Problem: Tissue Perfusion: Goal: Adequacy of tissue perfusion will improve Outcome: Progressing   Problem: Education: Goal: Knowledge of General Education information will improve Description: Including pain rating scale, medication(s)/side effects and non-pharmacologic comfort measures Outcome: Progressing   Problem: Health Behavior/Discharge Planning: Goal: Ability to manage health-related needs will improve Outcome: Progressing   Problem: Clinical Measurements: Goal: Ability to maintain clinical measurements within normal limits will improve Outcome: Progressing Goal: Will remain free from infection Outcome: Progressing Goal: Diagnostic test results will improve Outcome: Progressing Goal: Respiratory complications will improve Outcome: Progressing Goal: Cardiovascular complication  will be avoided Outcome: Progressing   Problem: Activity: Goal: Risk for activity intolerance will decrease Outcome: Progressing   Problem: Nutrition: Goal: Adequate nutrition will be maintained Outcome: Progressing

## 2022-02-02 NOTE — Progress Notes (Addendum)
Progress Note  Patient Name: Dakin Madani Date of Encounter: 02/02/2022  [facility] Cardiologist: Dina Rich, MD   Subjective   Breathing is OK   NO dizzienss   Inpatient Medications    Scheduled Meds:  Chlorhexidine Gluconate Cloth  6 each Topical Daily   diltiazem  60 mg Oral Q6H   feeding supplement  237 mL Oral BID BM   furosemide  20 mg Intravenous Daily   insulin aspart  0-15 Units Subcutaneous TID WC   insulin aspart  0-5 Units Subcutaneous QHS   levothyroxine  25 mcg Oral Q0600   melatonin  10 mg Oral QHS   senna-docusate  1 tablet Oral BID   simethicone  80 mg Oral QID   simvastatin  10 mg Oral Daily   sodium chloride flush  3 mL Intravenous Q12H   sodium chloride flush  3 mL Intravenous Q12H   Continuous Infusions:  sodium chloride     amiodarone 30 mg/hr (02/02/22 0500)   PRN Meds: sodium chloride, acetaminophen **OR** acetaminophen, bisacodyl, levalbuterol, LORazepam, mouth rinse, sodium chloride flush, zolpidem   Vital Signs    Vitals:   02/02/22 0200 02/02/22 0300 02/02/22 0400 02/02/22 0500  BP: 112/74 105/71 103/79 118/79  Pulse: (!) 108 (!) 113 (!) 114 (!) 108  Resp: (!) 33 (!) 33 17 18  Temp:   98.5 F (36.9 C)   TempSrc:   Oral   SpO2: 94% 96% 97% 96%  Weight:    71 kg  Height:        Intake/Output Summary (Last 24 hours) at 02/02/2022 0720 Last data filed at 02/02/2022 0500 Gross per 24 hour  Intake 756.6 ml  Output 1008 ml  Net -251.4 ml      02/02/2022    5:00 AM 02/01/2022    5:45 AM 01/31/2022    5:00 AM  Last 3 Weights  Weight (lbs) 156 lb 9.6 oz 158 lb 1.1 oz 157 lb 6.5 oz  Weight (kg) 71.033 kg 71.7 kg 71.4 kg      Telemetry    Afib   70s to 100s  - Personally Reviewed   Physical Exam   GEN: NAD Neck: Neck is full   JVP is increased  Cardiac: irregular and tachycardic, no gallop Respiratory: Diminished breath sounds bases; no wheeze GI: Soft, NT/ND MS: No edema Neuro:  Grossly intact Psych: Normal affect    Labs    High Sensitivity Troponin:   Recent Labs  Lab 01/16/22 0659 01/16/22 0942 01/28/22 1856 01/28/22 2025  TROPONINIHS 23* 19* 14 13     Chemistry Recent Labs  Lab 01/31/22 0358 01/31/22 2041 02/01/22 0019  NA 139 140 139  K 3.6 4.0 3.7  CL 109 106 105  CO2 '22 24 23  '$ GLUCOSE 177* 108* 132*  BUN '11 11 10  '$ CREATININE 0.96 0.98 0.99  CALCIUM 8.8* 9.0 8.9  MG 1.7 1.8 1.8  PROT 5.9* 5.9* 5.8*  ALBUMIN 2.9* 2.8* 2.6*  AST 35 27 27  ALT 85* 67* 63*  ALKPHOS 60 59 57  BILITOT 1.2 1.2 0.8  GFRNONAA >60 >60 >60  ANIONGAP '8 10 11   '$ Hematology Recent Labs  Lab 01/31/22 0358 02/01/22 0019 02/02/22 0642  WBC 7.5 7.7 7.3  RBC 4.71 4.88 4.79  HGB 14.5 14.6 14.4  HCT 41.5 43.2 42.5  MCV 88.1 88.5 88.7  MCH 30.8 29.9 30.1  MCHC 34.9 33.8 33.9  RDW 13.6 13.4 13.5  PLT 274 322 287  Thyroid  Recent Labs  Lab 01/29/22 1235  TSH 1.947    BNP Recent Labs  Lab 01/28/22 2025  BNP 377.5*     Radiology    DG CHEST PORT 1 VIEW  Result Date: 02/01/2022 CLINICAL DATA:  Chest pain.  Follow-up exam. EXAM: PORTABLE CHEST 1 VIEW COMPARISON:  01/30/2022 and older studies. FINDINGS: Cardiac silhouette mildly enlarged. No mediastinal or hilar masses. There is a catheter that projects along the left heart or, which may reflect a femorally placed central catheter. It is not well visualized, and not seen on the prior exams. Opacity at both bases obscuring hemidiaphragms consistent with a combination of pleural effusions and atelectasis and/or infiltrate. Remainder of the lungs is clear. No pneumothorax. Stable left anterior chest wall pacemaker. IMPRESSION: 1. Bilateral effusions and lung base atelectasis and/or pneumonia is without significant change from the most recent prior study. No new lung abnormalities. No convincing pulmonary edema. 2. Possible new femorally placed central catheter projects along the left heart border, not well visualized. Electronically Signed   By: Lajean Manes M.D.   On: 02/01/2022 10:26   ECHOCARDIOGRAM LIMITED  Result Date: 01/31/2022    ECHOCARDIOGRAM LIMITED REPORT   Patient Name:   Brysin Towery Date of Exam: 01/31/2022 Medical Rec #:  [id number]     Height:       66.0 in Accession #:    [id number]    Weight:       157.4 lb Date of Birth:  12/21/1940      BSA:          1.806 m Patient Age:    32 years      BP:           138/24 mmHg Patient Gender: M             HR:           121 bpm. Exam Location:  Inpatient Procedure: Limited Echo Indications:    I31.3 Pericardial effusion (noninflammatory)  History:        Patient has prior history of Echocardiogram examinations, most                 recent 01/30/2022. Abnormal ECG, Aortic Valve Disease and Mitral                 Valve Disease, Arrythmias:Atrial Fibrillation, Tachycardia and                 Bradycardia; Risk Factors:Hypertension, Diabetes, Dyslipidemia                 and Sleep Apnea. Cardiac tamponade. Severe aortic stenosis.                 Moderate MR.  Sonographer:    Roseanna Rainbow RDCS Referring Phys: Lelon Perla  Sonographer Comments: Technically difficult study due to poor echo windows. Limited echo for pericardial effusion. Pericardial drain in subc region. IMPRESSIONS  1. Limited study to FU pericardial effusion; trivial effusion noted.  2. Left ventricular ejection fraction, by estimation, is 60 to 65%. The left ventricle has normal function. The left ventricle has no regional wall motion abnormalities.  3. Right ventricular systolic function is normal. The right ventricular size is normal.  4. Left atrial size was mildly dilated.  5. The inferior vena cava is normal in size with greater than 50% respiratory variability, suggesting right atrial pressure of 3 mmHg. FINDINGS  Left Ventricle: Left ventricular ejection fraction, by estimation, is 60  to 65%. The left ventricle has normal function. The left ventricle has no regional wall motion abnormalities. The left ventricular internal cavity size  was normal in size. Right Ventricle: The right ventricular size is normal. Right ventricular systolic function is normal. Left Atrium: Left atrial size was mildly dilated. Right Atrium: Right atrial size was normal in size. Pericardium: Trivial pericardial effusion is present. Aorta: The aortic root is normal in size and structure. Venous: The inferior vena cava is normal in size with greater than 50% respiratory variability, suggesting right atrial pressure of 3 mmHg. Additional Comments: Limited study to FU pericardial effusion; trivial effusion noted. A device lead is visualized.  LV Volumes (MOD) LV vol d, MOD A2C: 44.0 ml LV vol d, MOD A4C: 36.2 ml LV vol s, MOD A2C: 21.5 ml LV vol s, MOD A4C: 15.1 ml LV SV MOD A2C:     22.5 ml LV SV MOD A4C:     36.2 ml LV SV MOD BP:      23.3 ml IVC IVC diam: 2.00 cm Kirk Ruths MD Electronically signed by Kirk Ruths MD Signature Date/Time: 01/31/2022/12:33:55 PM    Final      Patient Profile     81 y.o. male with past medical history of paroxysmal atrial fibrillation, previous pacemaker insertion January 15, 8915 complicated by microperforation of the atrial lead status post revision June 16, aortic stenosis, chronic diastolic congestive heart failure, hypertension, hyperlipidemia, diabetes mellitus, obstructive sleep apnea admitted with CHF and hypotension.  Also noted to be in atrial fibrillation with rapid ventricular response.  Echocardiogram June 29 showed normal LV function, mild biatrial enlargement, moderate circumferential pericardial effusion, mild to moderate mitral regurgitation, severe aortic stenosis, mild aortic insufficiency.  Follow-up echocardiogram showed enlarging pericardial effusion with features concerning for tamponade.  Patient subsequently underwent pericardiocentesis.   Follow-up echocardiogram 7/1 showed trivial effusion.  Assessment & Plan    1 pericardial effusion-patient is status post pericardiocentesis for pericardial effusion  with tamponade physiology.  Follow-up echocardiogram  7/1 showed trivial effusion  Drain pulled  WIll probably order a limited echo Wed to reevaluate      2 atrial fibrillation with rapid ventricular response-cRates improved on current regimen    ON IV amio    On cardiazem 60 q 6 hours   Will switch to 240  tomorrow I have discussed with EP resumption of ELiquis iin this setting. 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  Plan for  follow-up as an outpatient for further management once above problems improved.  5 acute on chronic diastolic congestive heart failure-  VOlume is up some on exam   Will give 60 mg lasix today and follow response    Will tx to 4E today      For questions or updates, please contact [place] Please consult [email or web address] for contact info under        Signed, Dorris Carnes, MD  02/02/2022, 7:20 AM

## 2022-02-02 NOTE — Progress Notes (Signed)
Initial Nutrition Assessment  DOCUMENTATION CODES:   Non-severe (moderate) malnutrition in context of chronic illness  INTERVENTION:   Ensure Enlive po BID, each supplement provides 350 kcal and 20 grams of protein.  MVI with Minerals daily  If po intake not adequate on follow-up, recommend liberalizing diet  NUTRITION DIAGNOSIS:   Moderate Malnutrition related to chronic illness as evidenced by mild fat depletion, mild muscle depletion.  GOAL:   Patient will meet greater than or equal to 90% of their needs  MONITOR:   PO intake, Supplement acceptance, Labs, Weight trends  REASON FOR ASSESSMENT:   Malnutrition Screening Tool    ASSESSMENT:   81 yo male admitted with acute on chronic CHF, severe aortic stenosis. Pericardial effusion. PMH includes DM, anxiety, HTN, stroke, afib  Appetite is fair; recorded po intake 15-100% of meals. Pt ate 2 containers of cheerios with milk this AM and a few "Bo'Rounds" that his family brought in. Pt reports at home his appetite is good, he typically eats 3 meals per day. Pt reports however that he has been in and out of the hospital several times recently and he does not eat well in hospital.  Pt denies any problems chewing or swallowing; no N/V/D/C.   Pt reports UBW around 160 pounds over the last 6 months or so. Prior to this pt weighed 170 pounds. Current wt 157 pounds. Pt with some edema on exam, unsure of true dry weight. Pt attributes recent wt loss to being in and out of hospital. Pt reports he moved in with his youngest daughter's family least year and they "cook differently" than he is used to and he attributes previous wt loss to this.   Labs: CBGs 158-275, Creatinine wdl Meds: lasix, ss novolog, simethicone, senna-docusate   NUTRITION - FOCUSED PHYSICAL EXAM:  Flowsheet Row Most Recent Value  Orbital Region Mild depletion  Upper Arm Region No depletion  Thoracic and Lumbar Region No depletion  Buccal Region Mild depletion   Temple Region Mild depletion  Clavicle Bone Region Mild depletion  Clavicle and Acromion Bone Region Mild depletion  Scapular Bone Region Mild depletion  Dorsal Hand Mild depletion  Patellar Region Moderate depletion  Anterior Thigh Region Moderate depletion  Posterior Calf Region Mild depletion       Diet Order:   Diet Order             Diet heart healthy/carb modified Room service appropriate? Yes; Fluid consistency: Thin  Diet effective now                   EDUCATION NEEDS:   Education needs have been addressed  Skin:  Skin Assessment: Reviewed RN Assessment  Last BM:  7/2  Height:   Ht Readings from Last 1 Encounters:  01/29/22 '5\' 6"'$  (1.676 m)    Weight:   Wt Readings from Last 1 Encounters:  02/02/22 71 kg      BMI:  Body mass index is 25.28 kg/m.  Estimated Nutritional Needs:   Kcal:  1650-1850 kacls  Protein:  78-91 g  Fluid:  >/= 1.7 L    Kerman Passey MS, RDN, LDN, CNSC Registered Dietitian III Clinical Nutrition RD Pager and On-Call Pager Number Located in [place]

## 2022-02-02 NOTE — Progress Notes (Signed)
[facility]  Pt diuresed signficantly with 60 mg IV lasix  Check labs in AM Note that he converted to SR Reviewed with EP Transition to po amiodarone 400 bid  x 1 week then 200 bid Given hx of microperforation, bleeding, effusion would resume Eliquis on Saturday    Dorris Carnes MD

## 2022-02-03 DIAGNOSIS — Z950 Presence of cardiac pacemaker: Secondary | ICD-10-CM

## 2022-02-03 DIAGNOSIS — I110 Hypertensive heart disease with heart failure: Secondary | ICD-10-CM

## 2022-02-03 DIAGNOSIS — I314 Cardiac tamponade: Secondary | ICD-10-CM | POA: Diagnosis not present

## 2022-02-03 DIAGNOSIS — T829XXD Unspecified complication of cardiac and vascular prosthetic device, implant and graft, subsequent encounter: Secondary | ICD-10-CM

## 2022-02-03 DIAGNOSIS — I350 Nonrheumatic aortic (valve) stenosis: Secondary | ICD-10-CM

## 2022-02-03 DIAGNOSIS — I480 Paroxysmal atrial fibrillation: Secondary | ICD-10-CM | POA: Diagnosis not present

## 2022-02-03 LAB — GLUCOSE, CAPILLARY
Glucose-Capillary: 146 mg/dL — ABNORMAL HIGH (ref 70–99)
Glucose-Capillary: 156 mg/dL — ABNORMAL HIGH (ref 70–99)
Glucose-Capillary: 330 mg/dL — ABNORMAL HIGH (ref 70–99)
Glucose-Capillary: 113 mg/dL — ABNORMAL HIGH (ref 70–99)
Glucose-Capillary: 188 mg/dL — ABNORMAL HIGH (ref 70–99)

## 2022-02-03 MED ORDER — APIXABAN 5 MG PO TABS
5.0000 mg | ORAL_TABLET | Freq: Two times a day (BID) | ORAL | Status: DC
  Administered 2022-02-03 (×2): 5 mg via ORAL
  Filled 2022-02-03 (×3): qty 1

## 2022-02-03 MED ORDER — FUROSEMIDE 40 MG PO TABS
40.0000 mg | ORAL_TABLET | Freq: Every day | ORAL | Status: DC
  Administered 2022-02-03 – 2022-02-04 (×2): 40 mg via ORAL
  Filled 2022-02-03 (×2): qty 1

## 2022-02-03 NOTE — Care Management Important Message (Signed)
Important Message  Patient Details  Name: Edward Butler MRN: [id number] Date of Birth: 1941-05-17   Medicare Important Message Given:  Yes     Shelda Altes 02/03/2022, 8:50 AM

## 2022-02-03 NOTE — Progress Notes (Addendum)
Progress Note  Patient Name: Edward Butler Date of Encounter: 02/03/2022  [facility] Cardiologist: Dina Rich, MD   Subjective   Converted to sinus rhythm yesterday, but back in afib today. Net negative 2L overnight.   Inpatient Medications    Scheduled Meds:  amiodarone  400 mg Oral BID   Chlorhexidine Gluconate Cloth  6 each Topical Daily   diltiazem  60 mg Oral Q6H   feeding supplement  237 mL Oral BID BM   furosemide  60 mg Intravenous Daily   insulin aspart  0-15 Units Subcutaneous TID WC   insulin aspart  0-5 Units Subcutaneous QHS   levothyroxine  25 mcg Oral Q0600   melatonin  10 mg Oral QHS   senna-docusate  1 tablet Oral BID   simethicone  80 mg Oral QID   simvastatin  10 mg Oral Daily   sodium chloride flush  3 mL Intravenous Q12H   sodium chloride flush  3 mL Intravenous Q12H   Continuous Infusions:  sodium chloride     PRN Meds: sodium chloride, acetaminophen **OR** acetaminophen, bisacodyl, levalbuterol, LORazepam, mouth rinse, sodium chloride flush, zolpidem   Vital Signs    Vitals:   02/02/22 2116 02/02/22 2335 02/03/22 0445 02/03/22 0806  BP: 120/73 (!) 105/54 (!) 110/53 119/77  Pulse: 72 74 72 100  Resp: '20 20 20 20  '$ Temp: 98.4 F (36.9 C) 98 F (36.7 C) 97.7 F (36.5 C) 97.7 F (36.5 C)  TempSrc: Oral Oral Oral Oral  SpO2: 96% 95% 95% 97%  Weight:   70.3 kg   Height:        Intake/Output Summary (Last 24 hours) at 02/03/2022 0815 Last data filed at 02/03/2022 0449 Gross per 24 hour  Intake 339.95 ml  Output 2425 ml  Net -2085.05 ml      02/03/2022    4:45 AM 02/02/2022    5:00 AM 02/01/2022    5:45 AM  Last 3 Weights  Weight (lbs) 154 lb 14.4 oz 156 lb 9.6 oz 158 lb 1.1 oz  Weight (kg) 70.262 kg 71.033 kg 71.7 kg      Telemetry    Sinus rhythm - personally reviewed  Physical Exam   GEN: NAD Neck: No JVD Cardiac: RRR, no M/R/G's Respiratory: Diminished breath sounds bases; no wheeze GI: Soft, NT/ND MS: No edema Neuro:   Grossly intact Psych: Normal affect   Labs    High Sensitivity Troponin:   Recent Labs  Lab 01/16/22 0659 01/16/22 0942 01/28/22 1856 01/28/22 2025  TROPONINIHS 23* 19* 14 13     Chemistry Recent Labs  Lab 01/31/22 0358 01/31/22 2041 02/01/22 0019 02/02/22 0642  NA 139 140 139 139  K 3.6 4.0 3.7 3.6  CL 109 106 105 108  CO2 '22 24 23 '$ 21*  GLUCOSE 177* 108* 132* 181*  BUN '11 11 10 15  '$ CREATININE 0.96 0.98 0.99 1.09  CALCIUM 8.8* 9.0 8.9 8.8*  MG 1.7 1.8 1.8  --   PROT 5.9* 5.9* 5.8*  --   ALBUMIN 2.9* 2.8* 2.6*  --   AST 35 27 27  --   ALT 85* 67* 63*  --   ALKPHOS 60 59 57  --   BILITOT 1.2 1.2 0.8  --   GFRNONAA >60 >60 >60 >60  ANIONGAP '8 10 11 10   '$ Hematology Recent Labs  Lab 01/31/22 0358 02/01/22 0019 02/02/22 0642  WBC 7.5 7.7 7.3  RBC 4.71 4.88 4.79  HGB 14.5 14.6 14.4  HCT 41.5 43.2 42.5  MCV 88.1 88.5 88.7  MCH 30.8 29.9 30.1  MCHC 34.9 33.8 33.9  RDW 13.6 13.4 13.5  PLT 274 322 287   Thyroid  Recent Labs  Lab 01/29/22 1235  TSH 1.947    BNP Recent Labs  Lab 01/28/22 2025  BNP 377.5*     Radiology    No results found.   Patient Profile     81 y.o. male with past medical history of paroxysmal atrial fibrillation, previous pacemaker insertion January 15, 9936 complicated by microperforation of the atrial lead status post revision June 16, aortic stenosis, chronic diastolic congestive heart failure, hypertension, hyperlipidemia, diabetes mellitus, obstructive sleep apnea admitted with CHF and hypotension.  Also noted to be in atrial fibrillation with rapid ventricular response.  Echocardiogram June 29 showed normal LV function, mild biatrial enlargement, moderate circumferential pericardial effusion, mild to moderate mitral regurgitation, severe aortic stenosis, mild aortic insufficiency.  Follow-up echocardiogram showed enlarging pericardial effusion with features concerning for tamponade.  Patient subsequently underwent  pericardiocentesis.  Follow-up echocardiogram showed trivial effusion.  Assessment & Plan    1 pericardial effusion-patient is status post pericardiocentesis for pericardial effusion with tamponade physiology.  Follow-up echocardiogram  on 7/1 showed trivial effusion.    2 atrial fibrillation with rapid ventricular response-continue amiodarone.  Converted to normal sinus rhythm, but back in afib today. Taper amiodarone as planned by Dr. Harrington Challenger. Ok to resume eliquis today - has been 5 days since pericardiocentesis, 2 days since drain pull and he is back in afib.  3 severe aortic stenosis-patient has been seen by the structural heart team and plan is to follow-up as an outpatient for further management once above issues improved.  4 acute on chronic diastolic congestive heart failure-continue diuresis and follow renal function. Net negative 2L overnight - no BMET today. Appears euvolemic- will switch to oral lasix 40 mg daily today.   5 status post pacemaker-pacemaker site without evidence of infection.  For questions or updates, please contact [place] Please consult [email or web address] for contact info under   Pixie Casino, MD, FACC, [place] Director of the [facility] Diplomate of the American Board of Clinical Lipidology Attending Cardiologist  Direct Dial: [phone]  Fax: [phone]  Website:  [email or web address]  Pixie Casino, MD  02/03/2022, 8:15 AM

## 2022-02-04 ENCOUNTER — Encounter (HOSPITAL_COMMUNITY): Payer: Self-pay | Admitting: Internal Medicine

## 2022-02-04 ENCOUNTER — Inpatient Hospital Stay (HOSPITAL_COMMUNITY): Payer: Medicare HMO

## 2022-02-04 ENCOUNTER — Other Ambulatory Visit (HOSPITAL_COMMUNITY): Payer: Self-pay

## 2022-02-04 DIAGNOSIS — R571 Hypovolemic shock: Secondary | ICD-10-CM | POA: Diagnosis not present

## 2022-02-04 DIAGNOSIS — I3139 Other pericardial effusion (noninflammatory): Secondary | ICD-10-CM

## 2022-02-04 DIAGNOSIS — E440 Moderate protein-calorie malnutrition: Secondary | ICD-10-CM | POA: Insufficient documentation

## 2022-02-04 LAB — CBC
HCT: 41 % (ref 39.0–52.0)
Hemoglobin: 14.5 g/dL (ref 13.0–17.0)
MCH: 30.7 pg (ref 26.0–34.0)
MCHC: 35.4 g/dL (ref 30.0–36.0)
MCV: 86.9 fL (ref 80.0–100.0)
Platelets: 261 10*3/uL (ref 150–400)
RBC: 4.72 MIL/uL (ref 4.22–5.81)
RDW: 13.2 % (ref 11.5–15.5)
WBC: 6.6 10*3/uL (ref 4.0–10.5)
nRBC: 0 % (ref 0.0–0.2)

## 2022-02-04 LAB — GLUCOSE, CAPILLARY
Glucose-Capillary: 175 mg/dL — ABNORMAL HIGH (ref 70–99)
Glucose-Capillary: 255 mg/dL — ABNORMAL HIGH (ref 70–99)

## 2022-02-04 LAB — BASIC METABOLIC PANEL
Anion gap: 11 (ref 5–15)
BUN: 14 mg/dL (ref 8–23)
CO2: 22 mmol/L (ref 22–32)
Calcium: 8.4 mg/dL — ABNORMAL LOW (ref 8.9–10.3)
Chloride: 102 mmol/L (ref 98–111)
Creatinine, Ser: 0.95 mg/dL (ref 0.61–1.24)
GFR, Estimated: >60 mL/min (ref >60)
Glucose, Bld: 159 mg/dL — ABNORMAL HIGH (ref 70–99)
Potassium: 3.9 mmol/L (ref 3.5–5.1)
Sodium: 135 mmol/L (ref 135–145)

## 2022-02-04 LAB — CULTURE, BODY FLUID W GRAM STAIN -BOTTLE: Culture: NO GROWTH

## 2022-02-04 LAB — ECHOCARDIOGRAM LIMITED
Height: 66 in
S' Lateral: 3.8 cm
Weight: 2412.71 oz

## 2022-02-04 MED ORDER — FUROSEMIDE 40 MG PO TABS
40.0000 mg | ORAL_TABLET | Freq: Every day | ORAL | 1 refills | Status: AC
  Filled 2022-02-04: qty 30, 30d supply, fill #0

## 2022-02-04 MED ORDER — AMIODARONE HCL 200 MG PO TABS
400.0000 mg | ORAL_TABLET | Freq: Two times a day (BID) | ORAL | 1 refills | Status: DC
Start: 2022-02-04 — End: 2022-02-10
  Filled 2022-02-04: qty 120, 30d supply, fill #0

## 2022-02-04 MED ORDER — DILTIAZEM HCL ER COATED BEADS 180 MG PO CP24: 180.0000 mg | ORAL_CAPSULE | Freq: Every day | ORAL | Status: DC

## 2022-02-04 MED ORDER — DILTIAZEM HCL ER COATED BEADS 180 MG PO CP24
180.0000 mg | ORAL_CAPSULE | Freq: Every day | ORAL | 2 refills | Status: AC
  Filled 2022-02-04: qty 30, 30d supply, fill #0

## 2022-02-04 MED ORDER — APIXABAN 5 MG PO TABS: 5.0000 mg | ORAL_TABLET | Freq: Two times a day (BID) | ORAL | Status: AC

## 2022-02-04 MED ORDER — DILTIAZEM HCL 60 MG PO TABS
60.0000 mg | ORAL_TABLET | Freq: Four times a day (QID) | ORAL | 2 refills | Status: DC
  Filled 2022-02-04: qty 120, 30d supply, fill #0

## 2022-02-04 NOTE — Discharge Summary (Signed)
Discharge Summary    Patient ID: Edward Butler MRN: [id number]; DOB: July 29, 1941  Admit date: 01/28/2022 Discharge date: 02/04/2022  PCP:  Elsie Stain, MD   [facility] Providers Cardiologist:  Dina Rich, MD  Electrophysiologist:  Virl Axe, MD       Discharge Diagnoses    Principal Problem:   Hypovolemic shock [facility]) Active Problems:   Severe aortic stenosis   Essential hypertension   Chronic anticoagulation   Dyslipidemia   Paroxysmal atrial fibrillation with RVR ([place])   Type 2 diabetes mellitus without complication, without long-term current use of insulin (HCC)   OSA on CPAP   BPH (benign prostatic hyperplasia)   Hypothyroidism   Complication associated with cardiac pacemaker lead   Tachycardia-bradycardia syndrome ([place])   AKI (acute kidney injury) ([place])   Numbness of left lower extremity   Lactic acidosis   Transaminitis   Pericardial effusion with cardiac tamponade   History of cardiac pacemaker   Chronic diastolic CHF (congestive heart failure) (HCC)   Cardiac tamponade   Pericardial tamponade   Malnutrition of moderate degree    Diagnostic Studies/Procedures    Echo 01/29/22  IMPRESSIONS     1. Left ventricular ejection fraction, by estimation, is 60 to 65%. The  left ventricle has normal function. The left ventricle has no regional  wall motion abnormalities. There is mild concentric left ventricular  hypertrophy. Left ventricular diastolic  parameters are consistent with Grade II diastolic dysfunction  (pseudonormalization). Elevated left ventricular end-diastolic pressure.   2. Right ventricular systolic function is mildly reduced. The right  ventricular size is normal. There is moderately elevated pulmonary artery  systolic pressure. The estimated right ventricular systolic pressure is  55.3 mmHg.   3. Left atrial size was mildly dilated.   4. Right atrial size was mildly dilated.   5. Pericardial effusion measures 1.51cm  posteriorly at greatest  diameter.. Moderate pericardial effusion. The pericardial effusion is  circumferential.   6. The mitral valve is degenerative. Mild to moderate mitral valve  regurgitation. No evidence of mitral stenosis. Moderate mitral annular  calcification.   7. The aortic valve is calcified. There is severe calcifcation of the  aortic valve. There is severe thickening of the aortic valve. Aortic valve  regurgitation is mild. Severe aortic valve stenosis. Aortic regurgitation  PHT measures 485 msec. Aortic  valve area, by VTI measures 0.74 cm. Aortic valve mean gradient measures  42.0 mmHg. Aortic valve Vmax measures 4.05 m/s.   8. The inferior vena cava is normal in size with greater than 50%  respiratory variability, suggesting right atrial pressure of 3 mmHg.   9. Compared to study dated 10/29/2021, the mean AVG has increased from  36.4mg to 458mg, DVI has decreased from 0.35 to 0.24, Vmax has increased  from 3.8813mto 4.52m33mnd AVA has decreased from 0.99cm2 to 0.74cm2 all  consistent with now severe AS.   FINDINGS   Left Ventricle: Left ventricular ejection fraction, by estimation, is 60  to 65%. The left ventricle has normal function. The left ventricle has no  regional wall motion abnormalities. The left ventricular internal cavity  size was normal in size. There is   mild concentric left ventricular hypertrophy. Left ventricular diastolic  parameters are consistent with Grade II diastolic dysfunction  (pseudonormalization). Elevated left ventricular end-diastolic pressure.   Right Ventricle: The right ventricular size is normal. No increase in  right ventricular wall thickness. Right ventricular systolic function is  mildly reduced. There is moderately  elevated pulmonary artery systolic  pressure. The tricuspid regurgitant  velocity is 2.84 m/s, and with an assumed right atrial pressure of 15  mmHg, the estimated right ventricular systolic pressure is 41.6  mmHg.   Left Atrium: Left atrial size was mildly dilated.   Right Atrium: Right atrial size was mildly dilated.   Pericardium: Pericardial effusion measures 1.51cm posteriorly at greatest  diameter. A moderately sized pericardial effusion is present. The  pericardial effusion is circumferential.   Mitral Valve: The mitral valve is degenerative in appearance. There is  mild calcification of the mitral valve leaflet(s). Moderate mitral annular  calcification. Mild to moderate mitral valve regurgitation. No evidence of  mitral valve stenosis. MV peak  gradient, 8.9 mmHg. The mean mitral valve gradient is 3.0 mmHg.   Tricuspid Valve: The tricuspid valve is normal in structure. Tricuspid  valve regurgitation is mild . No evidence of tricuspid stenosis.   Aortic Valve: The aortic valve is calcified. There is severe calcifcation  of the aortic valve. There is severe thickening of the aortic valve.  Aortic valve regurgitation is mild. Aortic regurgitation PHT measures 485  msec. Severe aortic stenosis is  present. Aortic valve mean gradient measures 42.0 mmHg. Aortic valve peak  gradient measures 65.6 mmHg. Aortic valve area, by VTI measures 0.74 cm.   Pulmonic Valve: The pulmonic valve was normal in structure. Pulmonic valve  regurgitation is not visualized. No evidence of pulmonic stenosis.   Aorta: The aortic root is normal in size and structure.   Venous: The inferior vena cava is normal in size with greater than 50%  respiratory variability, suggesting right atrial pressure of 3 mmHg.   IAS/Shunts: No atrial level shunt detected by color flow Doppler.   Additional Comments: A device lead is visualized.   Limited Echo 01/30/22    1. Limited echo but known moderate MR and severe AS.   2. Left ventricular ejection fraction, by estimation, is 55 to 60%. The  left ventricle has normal function.   3. Pacing wires in RA/RV.   4. Compared to TTE done yesterday effusion seems larger  especially in  apical and subcostal views Patient in rapid afib but appears to have more  drop in mitral inflows with inspiration and RV diastolic collapse IVC  remains dilated over 2 as before Would  have concern for tamponade especially if on blood thinners for afib .  Large pericardial effusion. The pericardial effusion is circumferential.   FINDINGS   Left Ventricle: Left ventricular ejection fraction, by estimation, is 55  to 60%. The left ventricle has normal function. The left ventricular  internal cavity size was normal in size.   Right Ventricle: Pacing wires in RA/RV.   Pericardium: Compared to TTE done yesterday effusion seems larger  especially in apical and subcostal views Patient in rapid afib but appears  to have more drop in mitral inflows with inspiration and RV diastolic  collapse IVC remains dilated over 2 as  before Would have concern for tamponade especially if on blood thinners  for afib. A large pericardial effusion is present. The pericardial  effusion is circumferential.   Additional Comments: Limited echo but known moderate MR and severe AS.  Jenkins Rouge MD    Pericardiocentesis 01/30/22 _____________ Details Indication: Pericardial tamponade  Procedure: The risks, benefits, complications, treatment options, and expected outcomes were discussed with the patient. The patient and/or family concurred with the proposed plan, giving informed consent. The patient was brought to the cath lab. The chest  wall was prepped and draped. 1% lidocaine was used for local anesthesia. I long micropuncture needle was then passed under the sub-xiphoid process into the pericardial space. The  needle was removed and the micro sheath was passed over the wire. I then confirmed that we were in the pericardial space by injecting contrast which clearly outlined the pericardial space. I then placed the J wire into the pericardial space and dilated the tract. A pigtail drainage catheter  was advanced over the wire. 510 cc bloody fluid was removed. Limited echo confirmed resolution of the pericardial effusion. The drain was left in place and placed to suction.  Estimated blood loss <50 mL.   During this procedure no sedation was administered.      Echo 02/04/22 IMPRESSIONS     1. Trivial circumferential pericardial fluid with dense thick fibrous  tissue in the pericardial space with noted strands.   2. Left ventricular ejection fraction, by estimation, is 60 to 65%. The  left ventricle has normal function. There is moderate concentric left  ventricular hypertrophy. Left ventricular diastolic function could not be  evaluated.   3. Tricuspid regurgitation signal is inadequate for assessing PA  pressure.   4. The inferior vena cava is normal in size with greater than 50%  respiratory variability, suggesting right atrial pressure of 3 mmHg.   Comparison(s): A prior study was performed on 01/31/2022. No significant  change from prior study.   FINDINGS   Left Ventricle: Left ventricular ejection fraction, by estimation, is 60  to 65%. The left ventricle has normal function. There is moderate  concentric left ventricular hypertrophy. Left ventricular diastolic  function could not be evaluated.   Right Ventricle: Tricuspid regurgitation signal is inadequate for  assessing PA pressure.   Pericardium: Trivial circumferential pericardial fluid with dense thick  fibrous tissue in the pericardial space with noted strands.   Venous: The inferior vena cava is normal in size with greater than 50%  respiratory variability, suggesting right atrial pressure of 3 mmHg.   LEFT VENTRICLE  PLAX 2D  LVIDd:         4.70 cm  LVIDs:         3.80 cm  LV PW:         1.40 cm  LV IVS:        1.50 cm      IVC  IVC diam: 1.89 cm    History of Present Illness     Edward Butler is a 81 y.o. male with PMH of DM 2, HTN, CVA, atrial fibrillation, pacemaker implant with lead revision/repair  on 01/16/2022.  And presented to ER after visit to EP clinic with complication of lead Revision/repair with microperforation of the presumably atrial lead with repositioning of both leads.  In the EP clinic he complained of orthopnea and nocturnal dyspnea and found to be in a fib with RVR.  No lower ext edema.  From pacer interrogation was in a fib for about 26 hours on that visit.  BB added and after taking at home felt weak and clammy.  Called EMS with hypotension - after 200 cc NS BP up to 007 systolic.   In ER BP to 12R systolic and pressors started.  IV fluids given as well.  Initial lactate was 8 but improved to 5.  HR which had been elevated was in 60s in ER in V paced rhythm on EKG.    CT abd with possible pancreatitis, no PE.  CT brain showed Focal  area of increased attenuation in the right parietal region. Also small to moderate pericardial effusion that was stable since 01/16/22.Marland Kitchen  pt admitted with plans for tele, hold lasix and BB, blood cultures with recent procedure.     Hospital Course     Consultants: structural heart team. Dr. Burt Knack   01/29/22 his AS was now severe and with mild to mod MR.  His BP was improved his echo with mod effusion with some RV collapse but no tamponade.      01/30/22 was orthopneic and found to be in a fib with RVR.  IV amiodarone started.  His PPM had normal device function.   Repeat echo done and now with tamponade criteria.  Eliquis which he was on for PAF was held.   Dr. Burt Knack saw for his severe AS as well.   With his new echo changes and pt still dyspneic concern for hemorrhagic effusion.  If was felt pericardiocentesis was needed due to risk of of further hemodynamic compromise especially in the setting of his comorbidities and known severe aortic stenosis  Pt underwent pericardiocentesis removing over 510 cc blood.  Drain left in place and pt to ICU.  Apixaban on hold and plan to resume as outpt on 02/07/22 Saturday.  he developed some mild CHF and IV lasix  given as needed over next 3 days.    Follow up echo with trivial effusion, no significant drainage from drain.  Pericardial drain removed 02/01/22.    Pt converted to SR on 02/02/22.  He was transitioned to po amiodarone. Amiodarone 400 mg BID until Monday then 200 mg BID.  Today we was seen and eluated by Dr. Harrington Challenger and his echo was with trivial pericardial effusion.   Will resume eliquis on Sat the 8th.  For his chronic anticoagulation for PAF   His Severe AS has been evaluated by structural heart team and will follow him as outpt.    His acute on chronic diastolic HF was stable today and he will continue lasix 40 mg daily.  He is neg 1709 since admit and wt at discharge 68.4 Kg down from pk of 74.9 Kg.   His pacer site without infection.    He will follow up with EP, PA next week and then Dr. Harrington Challenger and Dr. Burt Knack in August.    His diabetes -2 was covered by SSI here and is stable mostly  we will resume his metformin.  His A1c was 5.9 stable, continue heart and carb modified diet.   His hypothyroid covered by synthroid.and TSH 1.947.   For his OSA he wore CPAP most nights.  Originally transaminitis with normalized LFTs at discharge.  Holding the vasotec and BB, BP is soft here at times.  He is on zocor but low dose  At discharge changed dilt 60 every 6 hours to 180 mg CD.  And I held his Proscar until seen in the office. He will call if any issues passing his urine.   Did the patient have an acute coronary syndrome (MI, NSTEMI, STEMI, etc) this admission?:  No                               Did the patient have a percutaneous coronary intervention (stent / angioplasty)?:  No.          _____________  Discharge Vitals Blood pressure 130/71, pulse 75, temperature 98.8 F (37.1 C), temperature source Oral, resp. rate 20, height  5' 6"  (1.676 m), weight 68.4 kg, SpO2 96 %.  Filed Weights   02/02/22 0500 02/03/22 0445 02/04/22 0415  Weight: 71 kg 70.3 kg 68.4 kg    Labs & Radiologic  Studies    CBC Recent Labs    02/02/22 0642 02/04/22 0122  WBC 7.3 6.6  HGB 14.4 14.5  HCT 42.5 41.0  MCV 88.7 86.9  PLT 287 676   Basic Metabolic Panel Recent Labs    02/02/22 0642 02/04/22 0122  NA 139 135  K 3.6 3.9  CL 108 102  CO2 21* 22  GLUCOSE 181* 159*  BUN 15 14  CREATININE 1.09 0.95  CALCIUM 8.8* 8.4*   Liver Function Tests No results for input(s): "AST", "ALT", "ALKPHOS", "BILITOT", "PROT", "ALBUMIN" in the last 72 hours. No results for input(s): "LIPASE", "AMYLASE" in the last 72 hours. High Sensitivity Troponin:   Recent Labs  Lab 01/16/22 0659 01/16/22 0942 01/28/22 1856 01/28/22 2025  TROPONINIHS 23* 19* 14 13    BNP Invalid input(s): "POCBNP" D-Dimer No results for input(s): "DDIMER" in the last 72 hours. Hemoglobin A1C No results for input(s): "HGBA1C" in the last 72 hours. Fasting Lipid Panel No results for input(s): "CHOL", "HDL", "LDLCALC", "TRIG", "CHOLHDL", "LDLDIRECT" in the last 72 hours. Thyroid Function Tests No results for input(s): "TSH", "T4TOTAL", "T3FREE", "THYROIDAB" in the last 72 hours.  Invalid input(s): "FREET3" _____________  ECHOCARDIOGRAM LIMITED  Result Date: 02/04/2022    ECHOCARDIOGRAM LIMITED REPORT   Patient Name:   BESNIK FEBUS Date of Exam: 02/04/2022 Medical Rec #:  [id number]     Height:       66.0 in Accession #:    [id number]    Weight:       150.8 lb Date of Birth:  Jan 16, 1941      BSA:          1.774 m Patient Age:    9 years      BP:           131/74 mmHg Patient Gender: M             HR:           73 bpm. Exam Location:  Inpatient Procedure: Limited Echo Indications:    F/U pericardial effusion  History:        Patient has prior history of Echocardiogram examinations, most                 recent 01/31/2022. Abnormal ECG, Aortic Valve Disease and Mitral                 Valve Disease, Arrythmias:Atrial Fibrillation, Tachycardia and                 Bradycardia; Risk Factors:Diabetes, Dyslipidemia and                  Hypertension.  Sonographer:    Melissa Morford RDCS (AE, PE) Referring Phys: [id number] PAULA V ROSS IMPRESSIONS  1. Trivial circumferential pericardial fluid with dense thick fibrous tissue in the pericardial space with noted strands.  2. Left ventricular ejection fraction, by estimation, is 60 to 65%. The left ventricle has normal function. There is moderate concentric left ventricular hypertrophy. Left ventricular diastolic function could not be evaluated.  3. Tricuspid regurgitation signal is inadequate for assessing PA pressure.  4. The inferior vena cava is normal in size with greater than 50% respiratory variability, suggesting right atrial pressure of 3 mmHg. Comparison(s): A prior study was performed  on 01/31/2022. No significant change from prior study. FINDINGS  Left Ventricle: Left ventricular ejection fraction, by estimation, is 60 to 65%. The left ventricle has normal function. There is moderate concentric left ventricular hypertrophy. Left ventricular diastolic function could not be evaluated. Right Ventricle: Tricuspid regurgitation signal is inadequate for assessing PA pressure. Pericardium: Trivial circumferential pericardial fluid with dense thick fibrous tissue in the pericardial space with noted strands. Venous: The inferior vena cava is normal in size with greater than 50% respiratory variability, suggesting right atrial pressure of 3 mmHg. LEFT VENTRICLE PLAX 2D LVIDd:         4.70 cm LVIDs:         3.80 cm LV PW:         1.40 cm LV IVS:        1.50 cm  IVC IVC diam: 1.89 cm Kardie Tobb DO Electronically signed by Berniece Salines DO Signature Date/Time: 02/04/2022/11:20:45 AM    Final    CARDIAC CATHETERIZATION  Result Date: 02/02/2022 Pericardial tamponade Successful pericardiocentesis with removal of 510 cc of bloody fluid Will monitor in the ICU overnight with drain in place. If output is down tomorrow, OK to remove in the morning.   DG CHEST PORT 1 VIEW  Result Date: 02/01/2022 CLINICAL DATA:   Chest pain.  Follow-up exam. EXAM: PORTABLE CHEST 1 VIEW COMPARISON:  01/30/2022 and older studies. FINDINGS: Cardiac silhouette mildly enlarged. No mediastinal or hilar masses. There is a catheter that projects along the left heart or, which may reflect a femorally placed central catheter. It is not well visualized, and not seen on the prior exams. Opacity at both bases obscuring hemidiaphragms consistent with a combination of pleural effusions and atelectasis and/or infiltrate. Remainder of the lungs is clear. No pneumothorax. Stable left anterior chest wall pacemaker. IMPRESSION: 1. Bilateral effusions and lung base atelectasis and/or pneumonia is without significant change from the most recent prior study. No new lung abnormalities. No convincing pulmonary edema. 2. Possible new femorally placed central catheter projects along the left heart border, not well visualized. Electronically Signed   By: Lajean Manes M.D.   On: 02/01/2022 10:26   ECHOCARDIOGRAM LIMITED  Result Date: 01/31/2022    ECHOCARDIOGRAM LIMITED REPORT   Patient Name:   Edward Butler Date of Exam: 01/31/2022 Medical Rec #:  [id number]     Height:       66.0 in Accession #:    [id number]    Weight:       157.4 lb Date of Birth:  1941/01/13      BSA:          1.806 m Patient Age:    89 years      BP:           138/24 mmHg Patient Gender: M             HR:           121 bpm. Exam Location:  Inpatient Procedure: Limited Echo Indications:    I31.3 Pericardial effusion (noninflammatory)  History:        Patient has prior history of Echocardiogram examinations, most                 recent 01/30/2022. Abnormal ECG, Aortic Valve Disease and Mitral                 Valve Disease, Arrythmias:Atrial Fibrillation, Tachycardia and  Bradycardia; Risk Factors:Hypertension, Diabetes, Dyslipidemia                 and Sleep Apnea. Cardiac tamponade. Severe aortic stenosis.                 Moderate MR.  Sonographer:    Roseanna Rainbow RDCS Referring Phys:  Lelon Perla  Sonographer Comments: Technically difficult study due to poor echo windows. Limited echo for pericardial effusion. Pericardial drain in subc region. IMPRESSIONS  1. Limited study to FU pericardial effusion; trivial effusion noted.  2. Left ventricular ejection fraction, by estimation, is 60 to 65%. The left ventricle has normal function. The left ventricle has no regional wall motion abnormalities.  3. Right ventricular systolic function is normal. The right ventricular size is normal.  4. Left atrial size was mildly dilated.  5. The inferior vena cava is normal in size with greater than 50% respiratory variability, suggesting right atrial pressure of 3 mmHg. FINDINGS  Left Ventricle: Left ventricular ejection fraction, by estimation, is 60 to 65%. The left ventricle has normal function. The left ventricle has no regional wall motion abnormalities. The left ventricular internal cavity size was normal in size. Right Ventricle: The right ventricular size is normal. Right ventricular systolic function is normal. Left Atrium: Left atrial size was mildly dilated. Right Atrium: Right atrial size was normal in size. Pericardium: Trivial pericardial effusion is present. Aorta: The aortic root is normal in size and structure. Venous: The inferior vena cava is normal in size with greater than 50% respiratory variability, suggesting right atrial pressure of 3 mmHg. Additional Comments: Limited study to FU pericardial effusion; trivial effusion noted. A device lead is visualized.  LV Volumes (MOD) LV vol d, MOD A2C: 44.0 ml LV vol d, MOD A4C: 36.2 ml LV vol s, MOD A2C: 21.5 ml LV vol s, MOD A4C: 15.1 ml LV SV MOD A2C:     22.5 ml LV SV MOD A4C:     36.2 ml LV SV MOD BP:      23.3 ml IVC IVC diam: 2.00 cm Kirk Ruths MD Electronically signed by Kirk Ruths MD Signature Date/Time: 01/31/2022/12:33:55 PM    Final    VAS [place] ABI WITH/WO TBI  Result Date: 01/31/2022  LOWER EXTREMITY DOPPLER STUDY Patient  Name:  Edward Butler  Date of Exam:   01/30/2022 Medical Rec #: [id number]      Accession #:    [id number] Date of Birth: 19-Nov-1940       Patient Gender: M Patient Age:   60 years Exam Location:  [facility] Procedure:      VAS [place] ABI WITH/WO TBI Referring Phys: Regan Lemming --------------------------------------------------------------------------------  Indications: Limb ischemia, left. High Risk Factors: Hypertension, Diabetes, no history of smoking.  Performing Technologist: Bobetta Lime BS RVT  Examination Guidelines: A complete evaluation includes at minimum, Doppler waveform signals and systolic blood pressure reading at the level of bilateral brachial, anterior tibial, and posterior tibial arteries, when vessel segments are accessible. Bilateral testing is considered an integral part of a complete examination. Photoelectric Plethysmograph (PPG) waveforms and toe systolic pressure readings are included as required and additional duplex testing as needed. Limited examinations for reoccurring indications may be performed as noted.  ABI Findings: +---------+------------------+-----+---------+----------------+ Right    Rt Pressure (mmHg)IndexWaveform Comment          +---------+------------------+-----+---------+----------------+ Brachial 127                                              +---------+------------------+-----+---------+----------------+  PTA      255               1.93 triphasicNon-compressible +---------+------------------+-----+---------+----------------+ DP       255               1.93 triphasicNon-compressible +---------+------------------+-----+---------+----------------+ Great Toe113               0.86                           +---------+------------------+-----+---------+----------------+ +---------+------------------+-----+----------+-------+ Left     Lt Pressure (mmHg)IndexWaveform  Comment +---------+------------------+-----+----------+-------+  Brachial 132                                      +---------+------------------+-----+----------+-------+ PTA      88                0.67 monophasic        +---------+------------------+-----+----------+-------+ DP       89                0.67 monophasic        +---------+------------------+-----+----------+-------+ Great Toe0                 0.00 Absent            +---------+------------------+-----+----------+-------+ +-------+-----------+-----------+------------+------------+ ABI/TBIToday's ABIToday's TBIPrevious ABIPrevious TBI +-------+-----------+-----------+------------+------------+ Right  [place]         0.86                                +-------+-----------+-----------+------------+------------+ Left   0.67       0                                   +-------+-----------+-----------+------------+------------+ The left great toe pressure is deferred due to absent waveform in the left great toe.  Summary: Right: Resting right ankle-brachial index indicates noncompressible right lower extremity arteries. The right toe-brachial index is normal. Left: Resting left ankle-brachial index indicates moderate left lower extremity arterial disease. The left toe-brachial index is abnormal. *See table(s) above for measurements and observations.  Electronically signed by Jamelle Haring on 01/31/2022 at 9:42:08 AM.    Final    ECHOCARDIOGRAM LIMITED  Result Date: 01/30/2022    ECHOCARDIOGRAM LIMITED REPORT   Patient Name:   Edward Butler Date of Exam: 01/30/2022 Medical Rec #:  [id number]     Height:       66.0 in Accession #:    [id number]    Weight:       165.1 lb Date of Birth:  03/05/1941      BSA:          1.843 m Patient Age:    7 years      BP:           147/99 mmHg Patient Gender: M             HR:           117 bpm. Exam Location:  Inpatient Procedure: Limited Echo Indications:    pericardiocentesis  History:        Patient has prior history of Echocardiogram examinations, most                  recent  01/30/2022. Arrythmias:Atrial Fibrillation; Risk                 Factors:Diabetes, Hypertension and Dyslipidemia.  Sonographer:    Johny Chess RDCS Referring Phys: [place]  1. Limited study with 3 off axis apical images Post pericardiocentesis with improvement in moderate effusion seen on TTE earlier today Only trival effusion remaining with improved RV diastolic expansion. FINDINGS  Additional Comments: Limited study with 3 off axis apical images Post pericardiocentesis with improvement in moderate effusion seen on TTE earlier today Only trival effusion remaining with improved RV diastolic expansion. Jenkins Rouge MD Electronically signed by Jenkins Rouge MD Signature Date/Time: 01/30/2022/4:56:24 PM    Final    ECHOCARDIOGRAM LIMITED  Result Date: 01/30/2022    ECHOCARDIOGRAM LIMITED REPORT   Patient Name:   Edward Butler Date of Exam: 01/30/2022 Medical Rec #:  [id number]     Height:       66.0 in Accession #:    [id number]    Weight:       165.1 lb Date of Birth:  04/10/1941      BSA:          1.843 m Patient Age:    45 years      BP:           129/91 mmHg Patient Gender: M             HR:           120 bpm. Exam Location:  Inpatient Procedure: Limited Echo and Cardiac Doppler Indications:    Pericardial Effusion  History:        Patient has prior history of Echocardiogram examinations.                 Pacemaker, Aortic Valve Disease; Risk Factors:Hypertension,                 Diabetes and Dyslipidemia.  Sonographer:    Meagan Baucom RDCS, FE, PE Referring Phys: [place]  1. Limited echo but known moderate MR and severe AS.  2. Left ventricular ejection fraction, by estimation, is 55 to 60%. The left ventricle has normal function.  3. Pacing wires in RA/RV.  4. Compared to TTE done yesterday effusion seems larger especially in apical and subcostal views Patient in rapid afib but appears to have more drop in mitral inflows with inspiration  and RV diastolic collapse IVC remains dilated over 2 as before Would have concern for tamponade especially if on blood thinners for afib . Large pericardial effusion. The pericardial effusion is circumferential. FINDINGS  Left Ventricle: Left ventricular ejection fraction, by estimation, is 55 to 60%. The left ventricle has normal function. The left ventricular internal cavity size was normal in size. Right Ventricle: Pacing wires in RA/RV. Pericardium: Compared to TTE done yesterday effusion seems larger especially in apical and subcostal views Patient in rapid afib but appears to have more drop in mitral inflows with inspiration and RV diastolic collapse IVC remains dilated over 2 as before Would have concern for tamponade especially if on blood thinners for afib. A large pericardial effusion is present. The pericardial effusion is circumferential. Additional Comments: Limited echo but known moderate MR and severe AS. Jenkins Rouge MD Electronically signed by Jenkins Rouge MD Signature Date/Time: 01/30/2022/11:44:17 AM    Final    CT HEAD WO CONTRAST (5MM)  Result Date: 01/30/2022 CLINICAL DATA:  Neurological deficit, suspected stroke. History atrial fibrillation, diabetes mellitus, hypertension, stroke  EXAM: CT HEAD WITHOUT CONTRAST TECHNIQUE: Contiguous axial images were obtained from the base of the skull through the vertex without intravenous contrast. RADIATION DOSE REDUCTION: This exam was performed according to the departmental dose-optimization program which includes automated exposure control, adjustment of the mA and/or kV according to patient size and/or use of iterative reconstruction technique. COMPARISON:  01/28/2022 FINDINGS: Brain: Generalized atrophy. Normal ventricular morphology. No midline shift or mass effect. Small vessel chronic ischemic changes of deep cerebral white matter. Again identified infarct LEFT periventricular white matter. Area of high attenuation at the RIGHT parietal lobe on  previous exam not identified on this con contrast study. No intracranial hemorrhage, mass lesion, or new area of infarction. No extra-axial fluid collections. Vascular: No hyperdense vessels. Atherosclerotic calcifications of internal carotid and vertebral arteries at skull base Skull: Intact Sinuses/Orbits: Clear Other: N/A IMPRESSION: Atrophy with small vessel chronic ischemic changes of deep cerebral white matter. 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 No acute intracranial abnormalities. Area of high attenuation at RIGHT parietal lobe on previous exam not identified on current noncontrast study, likely represented enhancement of uncertain etiology. Electronically Signed   By: Lavonia Dana M.D.   On: 01/30/2022 08:42   DG CHEST PORT 1 VIEW  Result Date: 01/30/2022 CLINICAL DATA:  81 year old male with history of shortness of breath and pleural effusion. EXAM: PORTABLE CHEST 1 VIEW COMPARISON:  Chest x-ray 01/28/2022. FINDINGS: Skin fold artifact projecting over the lateral right hemithorax incidentally noted. Lung volumes are slightly low. Bibasilar opacities which may reflect areas of atelectasis and/or consolidation. Small bilateral pleural effusions. No pneumothorax. No evidence of pulmonary edema. Heart size is mild heart size is enlarged. Upper mediastinal contours are distorted by patient positioning. Atherosclerotic calcifications in the thoracic aorta. Left-sided pacemaker device in place with lead tips projecting over the expected location of the right atrium and right ventricle. IMPRESSION: 1. Low lung volumes with bibasilar opacities favored to reflect areas of atelectasis (airspace consolidation is not excluded), with superimposed small bilateral pleural effusions. 2. Cardiomegaly. 3. Aortic atherosclerosis. Electronically Signed   By: Vinnie Langton M.D.   On: 01/30/2022 06:27   ECHOCARDIOGRAM COMPLETE  Result Date: 01/29/2022    ECHOCARDIOGRAM REPORT   Patient Name:   Edward Butler  Date of Exam: 01/29/2022 Medical Rec #:  [id number]     Height:       66.0 in Accession #:    [id number]    Weight:       164.2 lb Date of Birth:  [birth year]      BSA:          1.839 m Patient Age:    72 years      BP:           108/67 mmHg Patient Gender: M             HR:           69 bpm. Exam Location:  Inpatient Procedure: 2D Echo, Cardiac Doppler and Color Doppler STAT ECHO Indications:    Aortic valve disorder  History:        Patient has prior history of Echocardiogram examinations. Risk                 Factors:Dyslipidemia, Diabetes and Hypertension.  Sonographer:    Jyl Heinz Referring Phys: [id number] Regan Lemming IMPRESSIONS  1. Left ventricular ejection fraction, by estimation, is 60 to 65%. The left ventricle has normal function. The left ventricle has no regional wall motion abnormalities. There  is mild concentric left ventricular hypertrophy. Left ventricular diastolic parameters are consistent with Grade II diastolic dysfunction (pseudonormalization). Elevated left ventricular end-diastolic pressure.  2. Right ventricular systolic function is mildly reduced. The right ventricular size is normal. There is moderately elevated pulmonary artery systolic pressure. The estimated right ventricular systolic pressure is 82.5 mmHg.  3. Left atrial size was mildly dilated.  4. Right atrial size was mildly dilated.  5. Pericardial effusion measures 1.51cm posteriorly at greatest diameter.. Moderate pericardial effusion. The pericardial effusion is circumferential.  6. The mitral valve is degenerative. Mild to moderate mitral valve regurgitation. No evidence of mitral stenosis. Moderate mitral annular calcification.  7. The aortic valve is calcified. There is severe calcifcation of the aortic valve. There is severe thickening of the aortic valve. Aortic valve regurgitation is mild. Severe aortic valve stenosis. Aortic regurgitation PHT measures 485 msec. Aortic valve area, by VTI measures 0.74 cm. Aortic valve  mean gradient measures 42.0 mmHg. Aortic valve Vmax measures 4.05 m/s.  8. The inferior vena cava is normal in size with greater than 50% respiratory variability, suggesting right atrial pressure of 3 mmHg.  9. Compared to study dated 10/29/2021, the mean AVG has increased from 36.69mg to 483mg, DVI has decreased from 0.35 to 0.24, Vmax has increased from 3.8816mto 4.56m52mnd AVA has decreased from 0.99cm2 to 0.74cm2 all consistent with now severe AS. FINDINGS  Left Ventricle: Left ventricular ejection fraction, by estimation, is 60 to 65%. The left ventricle has normal function. The left ventricle has no regional wall motion abnormalities. The left ventricular internal cavity size was normal in size. There is  mild concentric left ventricular hypertrophy. Left ventricular diastolic parameters are consistent with Grade II diastolic dysfunction (pseudonormalization). Elevated left ventricular end-diastolic pressure. Right Ventricle: The right ventricular size is normal. No increase in right ventricular wall thickness. Right ventricular systolic function is mildly reduced. There is moderately elevated pulmonary artery systolic pressure. The tricuspid regurgitant velocity is 2.84 m/s, and with an assumed right atrial pressure of 15 mmHg, the estimated right ventricular systolic pressure is 47.300.3g. Left Atrium: Left atrial size was mildly dilated. Right Atrium: Right atrial size was mildly dilated. Pericardium: Pericardial effusion measures 1.51cm posteriorly at greatest diameter. A moderately sized pericardial effusion is present. The pericardial effusion is circumferential. Mitral Valve: The mitral valve is degenerative in appearance. There is mild calcification of the mitral valve leaflet(s). Moderate mitral annular calcification. Mild to moderate mitral valve regurgitation. No evidence of mitral valve stenosis. MV peak gradient, 8.9 mmHg. The mean mitral valve gradient is 3.0 mmHg. Tricuspid Valve: The  tricuspid valve is normal in structure. Tricuspid valve regurgitation is mild . No evidence of tricuspid stenosis. Aortic Valve: The aortic valve is calcified. There is severe calcifcation of the aortic valve. There is severe thickening of the aortic valve. Aortic valve regurgitation is mild. Aortic regurgitation PHT measures 485 msec. Severe aortic stenosis is present. Aortic valve mean gradient measures 42.0 mmHg. Aortic valve peak gradient measures 65.6 mmHg. Aortic valve area, by VTI measures 0.74 cm. Pulmonic Valve: The pulmonic valve was normal in structure. Pulmonic valve regurgitation is not visualized. No evidence of pulmonic stenosis. Aorta: The aortic root is normal in size and structure. Venous: The inferior vena cava is normal in size with greater than 50% respiratory variability, suggesting right atrial pressure of 3 mmHg. IAS/Shunts: No atrial level shunt detected by color flow Doppler. Additional Comments: A device lead is visualized.  LEFT VENTRICLE PLAX 2D LVIDd:  4.20 cm      Diastology LVIDs:         2.70 cm      LV e' medial:    4.99 cm/s LV PW:         1.20 cm      LV E/e' medial:  29.5 LV IVS:        1.40 cm      LV e' lateral:   4.21 cm/s LVOT diam:     2.00 cm      LV E/e' lateral: 34.9 LV SV:         76 LV SV Index:   41 LVOT Area:     3.14 cm  LV Volumes (MOD) LV vol d, MOD A2C: 90.5 ml LV vol d, MOD A4C: 102.0 ml LV vol s, MOD A2C: 36.1 ml LV vol s, MOD A4C: 38.1 ml LV SV MOD A2C:     54.4 ml LV SV MOD A4C:     102.0 ml LV SV MOD BP:      60.6 ml RIGHT VENTRICLE            IVC RV Basal diam:  3.60 cm    IVC diam: 2.30 cm RV Mid diam:    3.20 cm RV S prime:     7.36 cm/s TAPSE (M-mode): 1.5 cm LEFT ATRIUM             Index        RIGHT ATRIUM           Index LA diam:        4.50 cm 2.45 cm/m   RA Area:     20.40 cm LA Vol (A2C):   51.8 ml 28.16 ml/m  RA Volume:   62.20 ml  33.82 ml/m LA Vol (A4C):   84.5 ml 45.94 ml/m LA Biplane Vol: 72.1 ml 39.20 ml/m  AORTIC VALVE AV Area  (Vmax):    0.78 cm AV Area (Vmean):   0.83 cm AV Area (VTI):     0.74 cm AV Vmax:           405.00 cm/s AV Vmean:          290.600 cm/s AV VTI:            1.020 m AV Peak Grad:      65.6 mmHg AV Mean Grad:      42.0 mmHg LVOT Vmax:         100.70 cm/s LVOT Vmean:        77.150 cm/s LVOT VTI:          0.242 m LVOT/AV VTI ratio: 0.24 AI PHT:            485 msec  AORTA Ao Root diam: 2.80 cm Ao Asc diam:  3.00 cm MITRAL VALVE                TRICUSPID VALVE MV Area (PHT): 2.83 cm     TR Peak grad:   32.3 mmHg MV Area VTI:   1.92 cm     TR Vmax:        284.00 cm/s MV Peak grad:  8.9 mmHg MV Mean grad:  3.0 mmHg     SHUNTS MV Vmax:       1.49 m/s     Systemic VTI:  0.24 m MV Vmean:      85.3 cm/s    Systemic Diam: 2.00 cm MV Decel Time: 268 msec MR Peak grad: 138.9 mmHg  MR Mean grad: 97.0 mmHg MR Vmax:      589.33 cm/s MR Vmean:     478.0 cm/s MV E velocity: 147.00 cm/s MV A velocity: 121.00 cm/s MV E/A ratio:  1.21 Fransico Him MD Electronically signed by Fransico Him MD Signature Date/Time: 01/29/2022/9:19:21 AM    Final    CT HEAD WO CONTRAST (5MM)  Result Date: 01/28/2022 CLINICAL DATA:  Acute neurological deficit.  Stroke suspected. EXAM: CT HEAD WITHOUT CONTRAST TECHNIQUE: Contiguous axial images were obtained from the base of the skull through the vertex without intravenous contrast. RADIATION DOSE REDUCTION: This exam was performed according to the departmental dose-optimization program which includes automated exposure control, adjustment of the mA and/or kV according to patient size and/or use of iterative reconstruction technique. COMPARISON:  None Available. FINDINGS: Brain: Note that patient received contrast material for a CTA chest earlier this evening, severely limiting the ability to detect intracranial hemorrhage. There is mild diffuse cerebral atrophy. Low-attenuation changes in the deep white matter consistent with small vessel ischemia. 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 There is a  focal area of increased density in the right parietal lobe extending to the cortical surface. This is probably an area of contrast enhancement, possibly representing a venous malformation, but this could also represent an area of infarct or acute intraparenchymal hemorrhage. Enhancing tumor is less likely. Consider MRI versus repeat CT in 24 hours after contrast material has washed out. No mass effect or midline shift. No abnormal extra-axial fluid collections. Gray-white matter junctions are distinct. Vascular: Vascular calcifications.  No aneurysm identified. Skull: Calvarium appears intact. Sinuses/Orbits: Paranasal sinuses and mastoid air cells are clear. Other: None. IMPRESSION: 1. Examination is technically limited due to residual IV contrast material from prior study. 2. Focal area of increased attenuation in the right parietal region. Differential diagnosis includes contrast enhancement in a AVM or less likely mass versus infarct or hemorrhage. Suggest MRI or repeat CT after contrast material has washed out. 3. Chronic atrophy and small vessel ischemic changes. Electronically Signed   By: Lucienne Capers M.D.   On: 01/28/2022 22:45   CT Angio Chest/Abd/Pel for Dissection W and/or Wo Contrast  Result Date: 01/28/2022 CLINICAL DATA:  Acute aortic syndrome (AAS) suspected. Shortness of breath. EXAM: CT ANGIOGRAPHY CHEST, ABDOMEN AND PELVIS TECHNIQUE: Non-contrast CT of the chest was initially obtained. Multidetector CT imaging through the chest, abdomen and pelvis was performed using the standard protocol during bolus administration of intravenous contrast. Multiplanar reconstructed images and MIPs were obtained and reviewed to evaluate the vascular anatomy. RADIATION DOSE REDUCTION: This exam was performed according to the departmental dose-optimization program which includes automated exposure control, adjustment of the mA and/or kV according to patient size and/or use of iterative reconstruction  technique. CONTRAST:  212m OMNIPAQUE IOHEXOL 350 MG/ML SOLN COMPARISON:  01/16/2022 FINDINGS: CTA CHEST FINDINGS Cardiovascular: Heart is normal size. Small to moderate pericardial effusion, stable since prior study. Extensive coronary artery calcifications. Moderate aortic calcifications. No evidence of aortic aneurysm or dissection. Mediastinum/Nodes: No mediastinal, hilar, or axillary adenopathy. Trachea and esophagus are unremarkable. Thyroid unremarkable. Lungs/Pleura: Small bilateral pleural effusions, new since prior study. Bibasilar atelectasis. Musculoskeletal: Left chest wall pacer noted. Chest wall soft tissues are unremarkable. No acute bony abnormality. Review of the MIP images confirms the above findings. CTA ABDOMEN AND PELVIS FINDINGS VASCULAR Aorta: Diffuse aortic atherosclerosis. No evidence of aortic aneurysm or dissection. Celiac: Widely patent. SMA: Widely patent Renals: Single bilaterally, widely patent IMA: Widely patent Inflow: Atherosclerotic  calcifications diffusely. No aneurysm or dissection. Veins: No obvious venous abnormality within the limitations of this arterial phase study. Review of the MIP images confirms the above findings. NON-VASCULAR Hepatobiliary: Prior cholecystectomy.  No focal hepatic abnormality. Pancreas: No focal abnormality or ductal dilatation. There is mild stranding seen adjacent to the pancreatic head and in the adjacent porta hepatis and adjacent to the descending duodenum. Also mild stranding adjacent to the pancreatic tail. This could reflect early acute pancreatitis. Recommend clinical correlation. Spleen: No focal abnormality.  Normal size. Adrenals/Urinary Tract: Adrenal glands unremarkable. Bilateral small renal cysts, appear benign. No follow-up imaging recommended. No hydronephrosis. Urinary bladder decompressed. Stomach/Bowel: Left colonic diverticulosis. No active diverticulitis. Stomach and small bowel decompressed, grossly unremarkable. Lymphatic: No  adenopathy Reproductive: No visible focal abnormality. Other: No free fluid or free air. Bilateral inguinal hernias containing fat, left larger than right. Musculoskeletal: No acute bony abnormality. Review of the MIP images confirms the above findings. IMPRESSION: Aortic atherosclerosis. No evidence of aortic aneurysm or dissection. Small bilateral pleural effusions.  Bibasilar atelectasis. Mild stranding/edema noted adjacent to the pancreatic head and pancreatic tail raising the possibility of early acute pancreatitis. Recommend clinical correlation. Left colonic diverticulosis. Electronically Signed   By: Rolm Baptise M.D.   On: 01/28/2022 20:31   DG Chest Port 1 View  Result Date: 01/28/2022 CLINICAL DATA:  Presyncope EXAM: PORTABLE CHEST 1 VIEW COMPARISON:  None Available. FINDINGS: Mild bibasilar atelectasis or infiltrate. Small right pleural effusion is present. No pneumothorax. Cardiomegaly is stable. 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 Pulmonary vascularity is normal. No acute bone abnormality. IMPRESSION: 1. Mild bibasilar atelectasis or infiltrate. Small right pleural effusion. 2. Stable cardiomegaly. Electronically Signed   By: Fidela Salisbury M.D.   On: 01/28/2022 19:47   CUP PACEART INCLINIC DEVICE CHECK  Result Date: 01/28/2022 Wound check appointment. Steri-strips removed. Wound without redness or edema. Incision edges approximated, wound well healed. Normal device function. RA/RV sensing, and impedances consistent with implant measurements. Unable to run thresholds secondary to RVR. Burden 99.8% with current RVR episode ongoing from 1 day, 10 hours ago. Patient symptomatic complaining of inability to take a deep breath. +OAC. Dr. Caryl Comes in to assess patient and patient added to Dr. Aquilla Hacker schedule today. Device programmed at 3.5V/auto capture programmed on for extra safety margin until 3 month visit. Histogram distribution appropriate for patient and level of activity.  DG  Chest 2 View  Addendum Date: 01/17/2022   ADDENDUM REPORT: 01/17/2022 15:05 ADDENDUM: Corrected report: Original report was generated with an error due to voice recognition software, corrected as follows: Mild blunting of the costophrenic angles, likely due to trace pleural effusions. Electronically Signed   By: Yetta Glassman M.D.   On: 01/17/2022 15:05   Result Date: 01/17/2022 CLINICAL DATA:  Pacemaker placement EXAM: CHEST - 2 VIEW COMPARISON:  Chest x-ray dated February 15, 2022 FINDINGS: Unchanged cardiac and mediastinal contours. Left chest wall dual lead pacer with unchanged lead position. Mild linear bibasilar opacities, likely due to scarring or atelectasis. No focal consolidation. Mild blunting of the costophrenic angles, likely due to trace pleural effusions. No evidence of pneumothorax. IMPRESSION: 1. Bibasilar atelectasis. 2. No mild blunting of the costophrenic angles, likely due to trace pleural effusions. Electronically Signed: By: Yetta Glassman M.D. On: 01/17/2022 14:14   EP PPM/ICD IMPLANT  Result Date: 01/16/2022 Conclusion: Successful removal of a previously implanted RV pacing lead and repositioning of the right atrial pacing lead with insertion of a new right ventricular pacing  lead in a patient who developed severe pleuritic chest pain which was incessant 2 days after initial pacemaker insertion consistent with microperforation. Cristopher Peru, MD   CT Angio Chest PE W and/or Wo Contrast  Result Date: 01/16/2022 CLINICAL DATA:  Pulmonary embolism (PE) suspected, high prob. EXAM: CT ANGIOGRAPHY CHEST WITH CONTRAST TECHNIQUE: Multidetector CT imaging of the chest was performed using the standard protocol during bolus administration of intravenous contrast. Multiplanar CT image reconstructions and MIPs were obtained to evaluate the vascular anatomy. RADIATION DOSE REDUCTION: This exam was performed according to the departmental dose-optimization program which includes automated  exposure control, adjustment of the mA and/or kV according to patient size and/or use of iterative reconstruction technique. CONTRAST:  55m OMNIPAQUE IOHEXOL 350 MG/ML SOLN COMPARISON:  Chest radiograph 01/16/2022 FINDINGS: Cardiovascular: Pulmonary arterial opacification is adequate without evidence of emboli. There is thoracic aortic atherosclerosis without aneurysm. The heart is enlarged with extensive coronary atherosclerosis, and there is a small pericardial effusion. A recently placed left subclavian pacemaker is noted with gas and swelling in the regional soft tissues of the left chest wall and with leads terminating in the right atrium and right ventricle. Mediastinum/Nodes: No enlarged axillary, mediastinal, or hilar lymph nodes. Small sliding hiatal hernia. Unremarkable thyroid. Lungs/Pleura: No pleural effusion or pneumothorax. Mild atelectasis in the lung bases. 4 mm triangular subpleural nodule in the right middle lobe abutting the minor fissure most compatible with a benign intrapulmonary lymph node and for which no follow-up imaging is recommended. Mild bronchial wall thickening. Upper Abdomen: Status post cholecystectomy. 9 mm centrally low-density, peripherally calcified lesion anteriorly in the left hepatic lobe, too small to fully characterize. Musculoskeletal: No acute osseous abnormality or suspicious osseous lesion. Review of the MIP images confirms the above findings. IMPRESSION: 1. No evidence of pulmonary emboli. 2. Mild bronchial wall thickening and mild atelectasis in the lung bases. 3. Cardiomegaly with extensive coronary atherosclerosis and small pericardial effusion. 4. Small sliding hiatal hernia. 5. Aortic Atherosclerosis (ICD10-I70.0). Electronically Signed   By: ALogan BoresM.D.   On: 01/16/2022 08:18   DG Chest Portable 1 View  Result Date: 01/16/2022 CLINICAL DATA:  81year old male with history of chest pain. EXAM: PORTABLE CHEST 1 VIEW COMPARISON:  Chest x-ray 01/14/2022.  FINDINGS: Lung volumes are slightly low. Bibasilar opacities favored to reflect subsegmental atelectasis and/or chronic scarring. No confluent consolidative airspace disease. No pleural effusions. No pneumothorax. No evidence of pulmonary edema. Heart size is mildly enlarged. The patient is rotated to the right on today's exam, resulting in distortion of the mediastinal contours and reduced diagnostic sensitivity and specificity for mediastinal pathology. Atherosclerotic calcifications are noted in the thoracic aorta. Left-sided pacemaker device in place with lead tips projecting over the expected location of the right atrium and right ventricle. IMPRESSION: 1. Low lung volumes with bibasilar areas of subsegmental atelectasis and/or chronic scarring. 2. Mild cardiomegaly. 3. Aortic atherosclerosis. Electronically Signed   By: DVinnie LangtonM.D.   On: 01/16/2022 07:48   DG Chest 2 View  Result Date: 01/14/2022 CLINICAL DATA:  Cardiac device in-situ. EXAM: CHEST - 2 VIEW COMPARISON:  Chest two views 10/29/2021 FINDINGS: Cardiac silhouette is again mildly enlarged. Mild-to-moderate calcification within aortic arch. New left chest wall cardiac pacer with leads overlying the right atrium and right ventricle. Unchanged mild bibasilar linear likely scarring. No pleural effusion or pneumothorax. Mild multilevel degenerative disc changes of the thoracic spine. IMPRESSION: 1. New left chest wall cardiac pacer.  No pneumothorax. 2. No acute lung process.  Electronically Signed   By: Yvonne Kendall M.D.   On: 01/14/2022 14:18    Disposition   Pt is being discharged home today in good condition.  Follow-up Plans & Appointments     Follow-up Information     Baldwin Jamaica, PA-C Follow up on 02/10/2022.   Specialty: Cardiology Why: at [facility] information: [address] [place] [address] [phone]         Fay Records, MD Follow up on 03/09/2022.   Specialty: Cardiology Why:  at 11:20 AM Contact information: [place] [address] [phone]         Sherren Mocha, MD Follow up on 03/11/2022.   Specialty: Cardiology Why: at 3:40 pm Contact information: [address] [address] [phone]                Heart Healthy Diabetic diet low salt   Resume eliquis on Saturday.  Not before  You are now on amiodarone to prevent atrial fibrillation, you will take 2 of the 200 mg tabs twice a day until Monday 10th then begin only 1 of 200 mg tab twice per day.    We held/stopped your vasotec and lopressor (metoprolol) and Verapamil    We added a fluid pill lasix (furosemide) and diltiazem for heart rate control.  The office may need to adjust on your appointment next week.   Hold the Proscar for now but we hope to add back next week.  If you have trouble passing your urine please call the office.   Discharge Medications   Allergies as of 02/04/2022   No Known Allergies      Medication List     STOP taking these medications    enalapril 5 MG tablet Commonly known as: VASOTEC   finasteride 5 MG tablet Commonly known as: PROSCAR   metoprolol tartrate 25 MG tablet Commonly known as: LOPRESSOR   verapamil 240 MG CR tablet Commonly known as: CALAN-SR       TAKE these medications    Accu-Chek FastClix Lancets Misc Use to check blood sugar once daily. E11.9   Accu-Chek Guide Me w/Device Kit USE AS DIRECTED   Accu-Chek Guide test strip Generic drug: glucose blood USE TO CHECK BLOOD SUGAR ONCE DAILY.   acetaminophen 500 MG tablet Commonly known as: TYLENOL Take 1,000 mg by mouth daily as needed for moderate pain.   amiodarone 200 MG tablet Commonly known as: PACERONE Take 2 tablets (400 mg total) by mouth 2 (two) times daily.   apixaban 5 MG Tabs tablet Commonly known as: ELIQUIS Take 1 tablet (5 mg total) by mouth 2 (two) times daily. Start taking on: February 07, 2022 What changed: These instructions start on February 07, 2022. If you are unsure what to do until then, ask your doctor or other care provider.   CALCIUM 600+D PO Take 2 tablets by mouth daily.   cholecalciferol 25 MCG (1000 UNIT) tablet Commonly known as: VITAMIN D3 Take 1,000 Units by mouth daily.   diltiazem 180 MG 24 hr capsule Commonly known as: CARDIZEM CD Take 1 capsule (180 mg total) by mouth daily. Start taking on: February 05, 2022   furosemide 40 MG tablet Commonly known as: LASIX Take 1 tablet (40 mg total) by mouth daily. Start taking on: February 05, 2022 What changed:  medication strength how much to take how to take this when to take this additional  instructions   levothyroxine 25 MCG tablet Commonly known as: SYNTHROID Take 25 mcg by mouth daily before breakfast.   magnesium oxide 400 MG tablet Commonly known as: MAG-OX Take 400 mg by mouth daily.   metFORMIN 500 MG 24 hr tablet Commonly known as: GLUCOPHAGE-XR Take 2 tablets (1,000 mg total) by mouth 2 (two) times daily.   simvastatin 10 MG tablet Commonly known as: ZOCOR Take 10 mg by mouth daily.           Outstanding Labs/Studies   BMP  Duration of Discharge Encounter   Greater than 30 minutes including physician time.  Signed, Cecilie Kicks, NP 02/04/2022, 2:35 PM

## 2022-02-04 NOTE — Progress Notes (Signed)
Mobility Specialist: Progress Note   02/04/22 1450  Mobility  Activity Ambulated independently in hallway  Level of Assistance Independent  Assistive Device None  Distance Ambulated (ft) 470 ft  Activity Response Tolerated well  $Mobility charge 1 Mobility   Received pt in chair having no complaints and agreeable to mobility. Pt was asymptomatic throughout ambulation and returned to room w/o fault. Left in chair w/ call bell in reach and all needs met.  [facility] Hanny Elsberry Mobility Specialist Mobility Specialist [address]: [phone]

## 2022-02-04 NOTE — Discharge Instructions (Addendum)
Heart Healthy Diabetic diet low salt   Resume eliquis on Saturday.  Not before  You are now on amiodarone to prevent atrial fibrillation, you will take 2 of the 200 mg tabs twice a day until Monday 10th then begin only 1 of 200 mg tab twice per day.    We held/stopped your vasotec and lopressor (metoprolol) and Verapamil    We added a fluid pill lasix (furosemide) and diltiazem for heart rate control.  The office may need to adjust on your appointment next week.   Hold the Proscar for now but we hope to add back next week.  If you have trouble passing your urine please call the office.

## 2022-02-04 NOTE — Progress Notes (Signed)
  Transition of Care [facility]) Screening Note   Patient Details  Name: Cutberto Winfree Date of Birth: [birth year]   Transition of Care [facility]) CM/SW Contact:    Cyndi Bender, RN Phone Number: 02/04/2022, 9:11 AM    Transition of Care Department [facility]) has reviewed patient and no TOC needs have been identified at this time. We will continue to monitor patient advancement through interdisciplinary progression rounds. If new patient transition needs arise, please place a TOC consult.

## 2022-02-04 NOTE — Progress Notes (Signed)
Progress Note  Patient Name: Edward Butler Date of Encounter: 02/04/2022  [facility] Cardiologist: Dina Rich, MD   Subjective   Pt breathing is ok   Denies CP    No dizziness  Inpatient Medications    Scheduled Meds:  amiodarone  400 mg Oral BID   apixaban  5 mg Oral BID   Chlorhexidine Gluconate Cloth  6 each Topical Daily   diltiazem  60 mg Oral Q6H   feeding supplement  237 mL Oral BID BM   furosemide  40 mg Oral Daily   insulin aspart  0-15 Units Subcutaneous TID WC   insulin aspart  0-5 Units Subcutaneous QHS   levothyroxine  25 mcg Oral Q0600   melatonin  10 mg Oral QHS   senna-docusate  1 tablet Oral BID   simethicone  80 mg Oral QID   simvastatin  10 mg Oral Daily   sodium chloride flush  3 mL Intravenous Q12H   sodium chloride flush  3 mL Intravenous Q12H   Continuous Infusions:  sodium chloride     PRN Meds: sodium chloride, acetaminophen **OR** acetaminophen, bisacodyl, levalbuterol, LORazepam, mouth rinse, sodium chloride flush, zolpidem   Vital Signs    Vitals:   02/03/22 1700 02/03/22 2032 02/03/22 2322 02/04/22 0415  BP: 123/71 127/72 110/64 128/60  Pulse: 73 74 74 73  Resp:  '20 18 20  '$ Temp: 98.4 F (36.9 C) (!) 97 F (36.1 C) (!) 97.4 F (36.3 C) 97.7 F (36.5 C)  TempSrc: Oral Axillary Oral Oral  SpO2: 98% 96% 98% 95%  Weight:    68.4 kg  Height:        Intake/Output Summary (Last 24 hours) at 02/04/2022 0747 Last data filed at 02/03/2022 2324 Gross per 24 hour  Intake 420 ml  Output 200 ml  Net 220 ml      02/04/2022    4:15 AM 02/03/2022    4:45 AM 02/02/2022    5:00 AM  Last 3 Weights  Weight (lbs) 150 lb 12.7 oz 154 lb 14.4 oz 156 lb 9.6 oz  Weight (kg) 68.4 kg 70.262 kg 71.033 kg      Telemetry    Sinus rhythm   Converted yesterday  - personally reviewed  Physical Exam   GEN: NAD Neck: No JVD Cardiac: RRR,Gr I/VI systolic murmru LSB Chest  Dressing dry Respiratory: =CTA GI: Soft, NT/ND MS: No edema Neuro:   Grossly intact Psych: Normal affect   Labs    High Sensitivity Troponin:   Recent Labs  Lab 01/16/22 0659 01/16/22 0942 01/28/22 1856 01/28/22 2025  TROPONINIHS 23* 19* 14 13     Chemistry Recent Labs  Lab 01/31/22 0358 01/31/22 2041 02/01/22 0019 02/02/22 0642 02/04/22 0122  NA 139 140 139 139 135  K 3.6 4.0 3.7 3.6 3.9  CL 109 106 105 108 102  CO2 '22 24 23 '$ 21* 22  GLUCOSE 177* 108* 132* 181* 159*  BUN '11 11 10 15 14  '$ CREATININE 0.96 0.98 0.99 1.09 0.95  CALCIUM 8.8* 9.0 8.9 8.8* 8.4*  MG 1.7 1.8 1.8  --   --   PROT 5.9* 5.9* 5.8*  --   --   ALBUMIN 2.9* 2.8* 2.6*  --   --   AST 35 27 27  --   --   ALT 85* 67* 63*  --   --   ALKPHOS 60 59 57  --   --   BILITOT 1.2 1.2 0.8  --   --  GFRNONAA >60 >60 >60 >60 >60  ANIONGAP '8 10 11 10 11   '$ Hematology Recent Labs  Lab 02/01/22 0019 02/02/22 0642 02/04/22 0122  WBC 7.7 7.3 6.6  RBC 4.88 4.79 4.72  HGB 14.6 14.4 14.5  HCT 43.2 42.5 41.0  MCV 88.5 88.7 86.9  MCH 29.9 30.1 30.7  MCHC 33.8 33.9 35.4  RDW 13.4 13.5 13.2  PLT 322 287 261   Thyroid  Recent Labs  Lab 01/29/22 1235  TSH 1.947    BNP Recent Labs  Lab 01/28/22 2025  BNP 377.5*     Radiology    No results found.   Patient Profile     81 y.o. male with past medical history of paroxysmal atrial fibrillation, previous pacemaker insertion January 15, 1307 complicated by microperforation of the atrial lead status post revision June 16, aortic stenosis, chronic diastolic congestive heart failure, hypertension, hyperlipidemia, diabetes mellitus, obstructive sleep apnea admitted with CHF and hypotension.  Also noted to be in atrial fibrillation with rapid ventricular response.  Echocardiogram June 29 showed normal LV function, mild biatrial enlargement, moderate circumferential pericardial effusion, mild to moderate mitral regurgitation, severe aortic stenosis, mild aortic insufficiency.  Follow-up echocardiogram showed enlarging pericardial  effusion with features concerning for tamponade.  Patient subsequently underwent pericardiocentesis.  Follow-up echocardiogram showed trivial effusion.  Assessment & Plan    1 pericardial effusion-patient is status post pericardiocentesis for pericardial effusion with tamponade physiology.  Follow-up echocardiogram  on 7/1 showed trivial effusion.  REpeat echo today shows some consolidation in pericardial space but trivial effusion      Keep on Eliquis until Saturday Resume after   2 atrial fibrillation with rapid ventricular response- Pt converted to SR again yesterday      Keep on 400 bid for 1 week today (to next Monday) and then decrease to 200 bid  Start Eliquis on Saturday   Reviewed wth pt why concern.   3 severe aortic stenosis-patient has been seen by the structural heart team this admit   Will  follow-up as an outpatient for further management once above issues resolved   Can make appt    4 acute on chronic diastolic congestive heart failure- Volume status appears pretty good   Keep on 40 lasix daily   5 status post pacemaker-pacemaker site without evidence of infection    Plan for d/c today     For questions or updates, please contact [employer] Please consult [email or web address] for contact info under    Dorris Carnes, MD  02/04/2022, 7:47 AM

## 2022-02-05 ENCOUNTER — Other Ambulatory Visit (HOSPITAL_COMMUNITY): Payer: Self-pay

## 2022-02-08 NOTE — Progress Notes (Addendum)
Cardiology Office Note Date:  02/10/2022  Patient ID:  Edward Butler, Edward Butler 1940-09-12, MRN [id number] PCP:  Storm Frisk, MD  Cardiologist:  Dr. Mindi Curling > ? Dr.Skains/Ross Electrophysiologist: Dr. Graciela Husbands    Chief Complaint:  post hospital  History of Present Illness: Edward Butler is a 81 y.o. 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 Discharged 01/17/22  He had his wound check visit though was found in rapid AFib and SOB and converted to a visit with Dr. Graciela Husbands 01/28/22, reported nocturnal SOB/orthopnea, he was in afib 130's Metoprolol and lasix started Discussed the current episode of Afib was 26 hours discussed low threshold to start amiodarone  Subsequently back to the hospital, SOB better but metoprolol made him feel bad, reported abd pain, low BP noted and metoprolol stopped. Pericardial effusion was increased in size, but not in tamponade and not unstable Suspected clinical picture secondary to low BP with volume depletion and new BB Structural team brought on board for consideration of TAVR with plans to see him outpt once recovered   serial/surveillance  echos showed enlarging effusion with symptoms and underwent pericardiocentesis/drain placement 01/30/22 Drain removed 02/01/22 Amiodarone used to aid in rate control and eventually dilt utilized as well Diuresed well Had conversion to SR Amiodarone planned at that juncture for 400mg  BID x1 week > 200mg  BID with plans to resume Eliquis after a week In/out of AFib though, f/u echo with trivial effusion Discharged 02/04/22, planned to resume Eliquis 02/07/22 No BB, no ACE with softer BPs Home on amio and dilt Planned for early visit with EP  and subsequent cardiology and structural teams  TODAY He restarted Eliquis 02/07/22 as instructed No CP, belly discomfort or symptoms Not quite back to "full strength" yet, but improving, his DM has gotten ooc since he has been in the hospital, sees his PMD soon. No SOB, no near syncope or syncope. No palpitations No bleeding or signs of bleeding  Device information MDT dual chamber PPM implanted 01/14/22 01/16/22 : lead revision with removal of RV lead and new RV lead placed, RA lead was repositioned (Dr. Ladona Ridgel)   Past Medical History:  Diagnosis Date   Aneurysm (HCC)    Atrial fibrillation (HCC)    CAP (community acquired pneumonia) 10/20/2021   Diabetes mellitus without complication (HCC)    Hypertension    Non-recurrent acute suppurative otitis media of right ear without spontaneous rupture of tympanic membrane 07/04/2021   Stroke [facility])     Past Surgical History:  Procedure Laterality Date   LEAD REVISION/REPAIR N/A 01/16/2022   Procedure: LEAD REVISION/REPAIR;  Surgeon: Marinus Maw, MD;  Location: [facility] INVASIVE CV LAB;  Service: Cardiovascular;  Laterality: N/A;   PACEMAKER IMPLANT N/A 01/14/2022   Procedure: PACEMAKER IMPLANT;  Surgeon: Duke Salvia, MD;  Location: [facility] INVASIVE CV LAB;  Service: Cardiovascular;  Laterality: N/A;   PERICARDIOCENTESIS N/A 01/30/2022   Procedure: PERICARDIOCENTESIS;  Surgeon: Kathleene Hazel, MD;  Location: [facility] INVASIVE CV LAB;  Service: Cardiovascular;  Laterality: N/A;   skin cancer surgeries       Current Outpatient Medications  Medication Sig Dispense Refill   Accu-Chek FastClix Lancets MISC Use to check blood sugar once daily. E11.9 100 each  2   acetaminophen (TYLENOL) 500 MG tablet Take 1,000 mg by mouth daily as needed for moderate pain.     apixaban (ELIQUIS) 5 MG TABS tablet Take 1 tablet (5 mg total) by mouth 2 (two) times daily. 60 tablet    Blood Glucose Monitoring Suppl (ACCU-CHEK GUIDE ME) w/Device KIT USE AS DIRECTED 1  kit 0   Calcium Carbonate-Vitamin D (CALCIUM 600+D PO) Take 2 tablets by mouth daily.     cholecalciferol (VITAMIN D3) 25 MCG (1000 UNIT) tablet Take 1,000 Units by mouth daily.     diltiazem (CARDIZEM CD) 180 MG 24 hr capsule Take 1 capsule (180 mg total) by mouth daily. 30 capsule 2   furosemide (LASIX) 40 MG tablet Take 1 tablet (40 mg total) by mouth daily. 30 tablet 1   glucose blood (ACCU-CHEK GUIDE) test strip USE TO CHECK BLOOD SUGAR ONCE DAILY. 100 strip 1   levothyroxine (SYNTHROID) 25 MCG tablet Take 25 mcg by mouth daily before breakfast.     magnesium oxide (MAG-OX) 400 MG tablet Take 400 mg by mouth daily.     metFORMIN (GLUCOPHAGE-XR) 500 MG 24 hr tablet Take 2 tablets (1,000 mg total) by mouth 2 (two) times daily. 360 tablet 0   simvastatin (ZOCOR) 10 MG tablet Take 10 mg by mouth daily.     amiodarone (PACERONE) 200 MG tablet Take 2 tablets (400 mg total) by mouth 2 (two) times daily. X 1 week. Than two (2) tablets (400 mg) daily X 2 weeks. Than one(1) tablet by mouth (200 mg) daily. 120 tablet 1   No current facility-administered medications for this visit.    Allergies:   Metoprolol   Social History:  The patient  reports that he has never smoked. He has never used smokeless tobacco. He reports that he does not currently use alcohol. He reports that he does not currently use drugs.   Family History:  The patient's family history is not on file.  ROS:  Please see the history of present illness.    All other systems are reviewed and otherwise negative.   PHYSICAL EXAM:  VS:  BP 132/70   Pulse 78   Ht 5' 6.5" (1.689 m)   Wt 149 lb (67.6 kg)   SpO2 97%   BMI 23.69 kg/m  BMI: Body mass index is 23.69 kg/m. Well nourished, well developed, in no acute distress HEENT: normocephalic, atraumatic Neck: no JVD, carotid bruits or masses Cardiac:  RRR; no significant murmurs, no rubs, or gallops Lungs:  CTA b/l, no wheezing, rhonchi or rales Abd: soft, nontender MS: no  deformity, age appropriate atrophy Ext: no edema Skin: warm and dry, no rash Neuro:  No gross deficits appreciated Psych: euthymic mood, full affect  PPM site is stable, no tethering or discomfort, no signs of infection, no erythema, edema, fluctuation, no heat   EKG:  not done today  Device interrogation done today and reviewed by myself:  Battery and lead measurements are good + AMS episodes 37/7% burden AP 1.2% VP 2.3%   Echo 01/29/22  IMPRESSIONS   1. Left ventricular ejection fraction, by estimation, is 60 to 65%. The  left ventricle has normal function. The left ventricle has no regional  wall motion abnormalities. There is mild concentric left ventricular  hypertrophy. Left ventricular diastolic  parameters are consistent with Grade II diastolic dysfunction  (pseudonormalization). Elevated left ventricular end-diastolic pressure.   2. Right ventricular systolic function is mildly reduced. The right  ventricular size is normal.  There is moderately elevated pulmonary artery  systolic pressure. The estimated right ventricular systolic pressure is  47.3 mmHg.   3. Left atrial size was mildly dilated.   4. Right atrial size was mildly dilated.   5. Pericardial effusion measures 1.51cm posteriorly at greatest  diameter.. Moderate pericardial effusion. The pericardial effusion is  circumferential.   6. The mitral valve is degenerative. Mild to moderate mitral valve  regurgitation. No evidence of mitral stenosis. Moderate mitral annular  calcification.   7. The aortic valve is calcified. There is severe calcifcation of the  aortic valve. There is severe thickening of the aortic valve. Aortic valve  regurgitation is mild. Severe aortic valve stenosis. Aortic regurgitation  PHT measures 485 msec. Aortic  valve area, by VTI measures 0.74 cm. Aortic valve mean gradient measures  42.0 mmHg. Aortic valve Vmax measures 4.05 m/s.   8. The inferior vena cava is normal in size with  greater than 50%  respiratory variability, suggesting right atrial pressure of 3 mmHg.   9. Compared to study dated 10/29/2021, the mean AVG has increased from  36. to , DVI has decreased from 0.35 to 0.24, Vmax has increased  from 3.81m/s to 4.55m/s and AVA has decreased from 0.99cm2 to 0.74cm2 all  consistent with now severe AS.   FINDINGS   Left Ventricle: Left ventricular ejection fraction, by estimation, is 60  to 65%. The left ventricle has normal function. The left ventricle has no  regional wall motion abnormalities. The left ventricular internal cavity  size was normal in size. There is   mild concentric left ventricular hypertrophy. Left ventricular diastolic  parameters are consistent with Grade II diastolic dysfunction  (pseudonormalization). Elevated left ventricular end-diastolic pressure.   Right Ventricle: The right ventricular size is normal. No increase in  right ventricular wall thickness. Right ventricular systolic function is  mildly reduced. There is moderately elevated pulmonary artery systolic  pressure. The tricuspid regurgitant  velocity is 2.84 m/s, and with an assumed right atrial pressure of 15  mmHg, the estimated right ventricular systolic pressure is 47.3 mmHg.   Left Atrium: Left atrial size was mildly dilated.   Right Atrium: Right atrial size was mildly dilated.   Pericardium: Pericardial effusion measures 1.51cm posteriorly at greatest  diameter. A moderately sized pericardial effusion is present. The  pericardial effusion is circumferential.   Mitral Valve: The mitral valve is degenerative in appearance. There is  mild calcification of the mitral valve leaflet(s). Moderate mitral annular  calcification. Mild to moderate mitral valve regurgitation. No evidence of  mitral valve stenosis. MV peak  gradient, 8.9 mmHg. The mean mitral valve gradient is 3.0 mmHg.   Tricuspid Valve: The tricuspid valve is normal in structure. Tricuspid   valve regurgitation is mild . No evidence of tricuspid stenosis.   Aortic Valve: The aortic valve is calcified. There is severe calcifcation  of the aortic valve. There is severe thickening of the aortic valve.  Aortic valve regurgitation is mild. Aortic regurgitation PHT measures 485  msec. Severe aortic stenosis is  present. Aortic valve mean gradient measures 42.0 mmHg. Aortic valve peak  gradient measures 65.6 mmHg. Aortic valve area, by VTI measures 0.74 cm.   Pulmonic Valve: The pulmonic valve was normal in structure. Pulmonic valve  regurgitation is not visualized. No evidence of pulmonic stenosis.   Aorta: The aortic root is normal in size and structure.   Venous: The inferior vena cava is normal in size with greater than 50%  respiratory  variability, suggesting right atrial pressure of 3 mmHg.   IAS/Shunts: No atrial level shunt detected by color flow Doppler.   Additional Comments: A device lead is visualized.    Limited Echo 01/30/22   1. Limited echo but known moderate MR and severe AS.   2. Left ventricular ejection fraction, by estimation, is 55 to 60%. The  left ventricle has normal function.   3. Pacing wires in RA/RV.   4. Compared to TTE done yesterday effusion seems larger especially in  apical and subcostal views Patient in rapid afib but appears to have more  drop in mitral inflows with inspiration and RV diastolic collapse IVC  remains dilated over 2 as before Would  have concern for tamponade especially if on blood thinners for afib .  Large pericardial effusion. The pericardial effusion is circumferential.   FINDINGS   Left Ventricle: Left ventricular ejection fraction, by estimation, is 55  to 60%. The left ventricle has normal function. The left ventricular  internal cavity size was normal in size.   Right Ventricle: Pacing wires in RA/RV.   Pericardium: Compared to TTE done yesterday effusion seems larger  especially in apical and subcostal views  Patient in rapid afib but appears  to have more drop in mitral inflows with inspiration and RV diastolic  collapse IVC remains dilated over 2 as  before Would have concern for tamponade especially if on blood thinners  for afib. A large pericardial effusion is present. The pericardial  effusion is circumferential.   Additional Comments: Limited echo but known moderate MR and severe AS.  Charlton Haws MD      Pericardiocentesis 01/30/22 _____________ Details Indication: Pericardial tamponade  Procedure: The risks, benefits, complications, treatment options, and expected outcomes were discussed with the patient. The patient and/or family concurred with the proposed plan, giving informed consent. The patient was brought to the cath lab. The chest wall was prepped and draped. 1% lidocaine was used for local anesthesia. I long micropuncture needle was then passed under the sub-xiphoid process into the pericardial space. The  needle was removed and the micro sheath was passed over the wire. I then confirmed that we were in the pericardial space by injecting contrast which clearly outlined the pericardial space. I then placed the J wire into the pericardial space and dilated the tract. A pigtail drainage catheter was advanced over the wire. 510 cc bloody fluid was removed. Limited echo confirmed resolution of the pericardial effusion. The drain was left in place and placed to suction.  Estimated blood loss <50 mL.   During this procedure no sedation was administered.        Echo 02/04/22 IMPRESSIONS  1. Trivial circumferential pericardial fluid with dense thick fibrous  tissue in the pericardial space with noted strands.   2. Left ventricular ejection fraction, by estimation, is 60 to 65%. The  left ventricle has normal function. There is moderate concentric left  ventricular hypertrophy. Left ventricular diastolic function could not be  evaluated.   3. Tricuspid regurgitation signal is inadequate  for assessing PA  pressure.   4. The inferior vena cava is normal in size with greater than 50%  respiratory variability, suggesting right atrial pressure of 3 mmHg.   Comparison(s): A prior study was performed on 01/31/2022. No significant  change from prior study.   FINDINGS   Left Ventricle: Left ventricular ejection fraction, by estimation, is 60  to 65%. The left ventricle has normal function. There is moderate  concentric left  ventricular hypertrophy. Left ventricular diastolic  function could not be evaluated.   Right Ventricle: Tricuspid regurgitation signal is inadequate for  assessing PA pressure.   Pericardium: Trivial circumferential pericardial fluid with dense thick  fibrous tissue in the pericardial space with noted strands.   Venous: The inferior vena cava is normal in size with greater than 50%  respiratory variability, suggesting right atrial pressure of 3 mmHg.   LEFT VENTRICLE  PLAX 2D  LVIDd:         4.70 cm  LVIDs:         3.80 cm  LV PW:         1.40 cm  LV IVS:        1.50 cm      IVC  IVC diam: 1.89 cm    Recent Labs: 01/28/2022: B Natriuretic Peptide 377.5 01/29/2022: TSH 1.947 02/01/2022: ALT 63; Magnesium 1.8 02/04/2022: BUN 14; Creatinine, Ser 0.95; Hemoglobin 14.5; Platelets 261; Potassium 3.9; Sodium 135  No results found for requested labs within last 365 days.   Estimated Creatinine Clearance: 55.1 mL/min (by C-G formula based on SCr of 0.95 mg/dL).   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 Pericardial effusion/tamponade No recurrent symptoms In d/w DOD, plan echo at 2 week mark Plan LFTs and TSH at the time of her echo    Disposition: F/u with Drs. Riccardo Dubin, and [place] as scheduled  Current medicines are reviewed at length with the patient today.  The patient did not have any concerns regarding medicines.  Norma Fredrickson, PA-C 02/10/2022 12:53 PM     [facility] [address] [place] [address] [phone] (office)  [phone] (fax)

## 2022-02-10 ENCOUNTER — Ambulatory Visit (INDEPENDENT_AMBULATORY_CARE_PROVIDER_SITE_OTHER): Payer: Medicare HMO | Admitting: Physician Assistant

## 2022-02-10 ENCOUNTER — Other Ambulatory Visit: Payer: Self-pay | Admitting: Physician Assistant

## 2022-02-10 ENCOUNTER — Encounter: Payer: Self-pay | Admitting: Physician Assistant

## 2022-02-10 VITALS — BP 132/70 | HR 78 | Ht 66.5 in | Wt 149.0 lb

## 2022-02-10 DIAGNOSIS — I350 Nonrheumatic aortic (valve) stenosis: Secondary | ICD-10-CM

## 2022-02-10 DIAGNOSIS — I314 Cardiac tamponade: Secondary | ICD-10-CM | POA: Diagnosis not present

## 2022-02-10 DIAGNOSIS — Z950 Presence of cardiac pacemaker: Secondary | ICD-10-CM

## 2022-02-10 DIAGNOSIS — I480 Paroxysmal atrial fibrillation: Secondary | ICD-10-CM

## 2022-02-10 DIAGNOSIS — I495 Sick sinus syndrome: Secondary | ICD-10-CM | POA: Diagnosis not present

## 2022-02-10 LAB — CUP PACEART INCLINIC DEVICE CHECK
Battery Remaining Longevity: 181 mo
Battery Voltage: 3.23 V
Brady Statistic AP VP Percent: 0.01 %
Brady Statistic AP VS Percent: 1.46 %
Brady Statistic AS VP Percent: 0.11 %
Brady Statistic AS VS Percent: 98.5 %
Brady Statistic RA Percent Paced: 1.18 %
Brady Statistic RV Percent Paced: 2.32 %
Date Time Interrogation Session: 20230711132847
Implantable Lead Implant Date: 20230614
Implantable Lead Implant Date: 20230616
Implantable Lead Location: 753859
Implantable Lead Location: 753860
Implantable Lead Model: 5076
Implantable Lead Model: 5076
Implantable Pulse Generator Implant Date: 20230614
Lead Channel Impedance Value: 323 Ohm
Lead Channel Impedance Value: 380 Ohm
Lead Channel Impedance Value: 532 Ohm
Lead Channel Impedance Value: 532 Ohm
Lead Channel Pacing Threshold Amplitude: 0.75 V
Lead Channel Pacing Threshold Amplitude: 0.75 V
Lead Channel Pacing Threshold Pulse Width: 0.4 ms
Lead Channel Pacing Threshold Pulse Width: 0.4 ms
Lead Channel Sensing Intrinsic Amplitude: 22.375 mV
Lead Channel Sensing Intrinsic Amplitude: 27.25 mV
Lead Channel Sensing Intrinsic Amplitude: 3.125 mV
Lead Channel Sensing Intrinsic Amplitude: 3.875 mV
Lead Channel Setting Pacing Amplitude: 3.5 V
Lead Channel Setting Pacing Amplitude: 3.5 V
Lead Channel Setting Pacing Pulse Width: 0.4 ms
Lead Channel Setting Sensing Sensitivity: 1.2 mV

## 2022-02-10 MED ORDER — AMIODARONE HCL 200 MG PO TABS: 400.0000 mg | ORAL_TABLET | Freq: Two times a day (BID) | ORAL | 1 refills | Status: AC

## 2022-02-10 NOTE — Patient Instructions (Signed)
Medication Instructions:   TAKE two (2) tablets by mouth (400 mg) twice daily X 1 week, than take two (2) tablets by mouth ( 400 mg) daily X 2 weeks, than take one (1) tablet by mouth ( 200 mg) daily.   *If you need a refill on your cardiac medications before your next appointment, please call your pharmacy*   Lab Work:  Your physician recommends that you return for lab work on Thursday, July 27 any time between 7:30-4:30 on the day of your echo.    If you have labs (blood work) drawn today and your tests are completely normal, you will receive your results only by: [facility] (if you have MyChart) OR A paper copy in the mail If you have any lab test that is abnormal or we need to change your treatment, we will call you to review the results.   Testing/Procedures:  Your physician has requested that you have an echocardiogram. Echocardiography is a painless test that uses sound waves to create images of your heart. It provides your doctor with information about the size and shape of your heart and how well your heart's chambers and valves are working. This procedure takes approximately one hour. There are no restrictions for this procedure. LABS SAME DAY AS ECHO!!! July 27 @ 10:35.     Follow-Up: At [facility], you and your health needs are our priority.  As part of our continuing mission to provide you with exceptional heart care, we have created designated Provider Care Teams.  These Care Teams include your primary Cardiologist (physician) and Advanced Practice Providers (APPs -  Physician Assistants and Nurse Practitioners) who all work together to provide you with the care you need, when you need it.  We recommend signing up for the patient portal called "MyChart".  Sign up information is provided on this After Visit Summary.  MyChart is used to connect with patients for Virtual Visits (Telemedicine).  Patients are able to view lab/test results, encounter notes, upcoming  appointments, etc.  Non-urgent messages can be sent to your provider as well.   To learn more about what you can do with MyChart, go to [email or web address].    Your next appointment:   1 month(s)  The format for your next appointment:   In Person  Provider:   Dr. Harrington Challenger Dr. Burt Knack Dr. Caryl Comes  If primary card or EP is not listed click here to update    :1}    Important Information About Sugar

## 2022-02-17 ENCOUNTER — Encounter: Payer: Self-pay | Admitting: Internal Medicine

## 2022-02-17 NOTE — Telephone Encounter (Signed)
Attempted phone call to pt.  Left voicemail message to check MyChart message.

## 2022-02-19 ENCOUNTER — Encounter: Payer: Self-pay | Admitting: Critical Care Medicine

## 2022-02-19 ENCOUNTER — Ambulatory Visit: Payer: Medicare HMO | Attending: Critical Care Medicine | Admitting: Critical Care Medicine

## 2022-02-19 VITALS — BP 117/73 | HR 91 | Temp 98.1°F | Resp 16 | Wt 150.0 lb

## 2022-02-19 DIAGNOSIS — E119 Type 2 diabetes mellitus without complications: Secondary | ICD-10-CM

## 2022-02-19 DIAGNOSIS — E785 Hyperlipidemia, unspecified: Secondary | ICD-10-CM

## 2022-02-19 DIAGNOSIS — E872 Acidosis, unspecified: Secondary | ICD-10-CM

## 2022-02-19 DIAGNOSIS — I10 Essential (primary) hypertension: Secondary | ICD-10-CM | POA: Diagnosis not present

## 2022-02-19 DIAGNOSIS — G4733 Obstructive sleep apnea (adult) (pediatric): Secondary | ICD-10-CM

## 2022-02-19 DIAGNOSIS — I495 Sick sinus syndrome: Secondary | ICD-10-CM | POA: Diagnosis not present

## 2022-02-19 DIAGNOSIS — N179 Acute kidney failure, unspecified: Secondary | ICD-10-CM

## 2022-02-19 DIAGNOSIS — R7401 Elevation of levels of liver transaminase levels: Secondary | ICD-10-CM | POA: Diagnosis not present

## 2022-02-19 DIAGNOSIS — I314 Cardiac tamponade: Secondary | ICD-10-CM | POA: Diagnosis not present

## 2022-02-19 DIAGNOSIS — Z7901 Long term (current) use of anticoagulants: Secondary | ICD-10-CM

## 2022-02-19 DIAGNOSIS — Z9989 Dependence on other enabling machines and devices: Secondary | ICD-10-CM

## 2022-02-19 DIAGNOSIS — L03818 Cellulitis of other sites: Secondary | ICD-10-CM

## 2022-02-19 DIAGNOSIS — I350 Nonrheumatic aortic (valve) stenosis: Secondary | ICD-10-CM | POA: Diagnosis not present

## 2022-02-19 DIAGNOSIS — L0390 Cellulitis, unspecified: Secondary | ICD-10-CM | POA: Insufficient documentation

## 2022-02-19 DIAGNOSIS — E039 Hypothyroidism, unspecified: Secondary | ICD-10-CM

## 2022-02-19 DIAGNOSIS — N400 Enlarged prostate without lower urinary tract symptoms: Secondary | ICD-10-CM

## 2022-02-19 DIAGNOSIS — R001 Bradycardia, unspecified: Secondary | ICD-10-CM

## 2022-02-19 DIAGNOSIS — R571 Hypovolemic shock: Secondary | ICD-10-CM

## 2022-02-19 LAB — GLUCOSE, POCT (MANUAL RESULT ENTRY): POC Glucose: 273 mg/dl — AB (ref 70–99)

## 2022-02-19 MED ORDER — DOXYCYCLINE HYCLATE 100 MG PO TABS: 100.0000 mg | ORAL_TABLET | Freq: Two times a day (BID) | ORAL | 0 refills | Status: AC

## 2022-02-19 NOTE — Assessment & Plan Note (Signed)
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

## 2022-02-19 NOTE — Assessment & Plan Note (Signed)
Reassess metabolic panel 

## 2022-02-19 NOTE — Assessment & Plan Note (Signed)
Patient compliant on CPAP

## 2022-02-19 NOTE — Assessment & Plan Note (Signed)
Thyroid function stable continue Synthroid

## 2022-02-19 NOTE — Assessment & Plan Note (Addendum)
Sugars running higher A1c was 5.9  Reassess metabolic panel and X4C  Patient given lifestyle medicine handout  Continue metformin as prescribed  The following Lifestyle Medicine recommendations according to [facility]) were discussed and offered to patient who agrees to start the journey:  A. Whole Foods, Plant-based plate comprising of fruits and vegetables, plant-based proteins, whole-grain carbohydrates was discussed in detail with the patient.   A list for source of those nutrients were also provided to the patient.  Patient will use only water or unsweetened tea for hydration. C.  A full color page of  Calorie density of various food groups per pound showing examples of each food groups was provided to the patient.   Patient will need to reduce his walking until he is improved from a cardiovascular standpoint

## 2022-02-19 NOTE — Assessment & Plan Note (Signed)
Continue statin. 

## 2022-02-19 NOTE — Assessment & Plan Note (Signed)
With recent pericardial bleed monitor CBC on Eliquis

## 2022-02-19 NOTE — Patient Instructions (Addendum)
Start doxycycline 1 twice daily  Follow a healthy diet see handout we gave you today  Stay off finasteride Proscar for now  Stay on the furosemide however we may ask you to hold that based on how your labs look when I get these back tomorrow  Wait on your COVID vaccination booster to the new booster comes out late August early September it is a better booster shot  Keep your appointment with cardiology next week  Complete screening labs obtained today including hemoglobin H8O metabolic panel blood count, urine for microalbumin will be obtained as well  No other medication changes you have plenty of refills on all current medications  Return to Dr. Joya Gaskins 1 month

## 2022-02-19 NOTE — Assessment & Plan Note (Signed)
Resolved

## 2022-02-19 NOTE — Assessment & Plan Note (Signed)
Heart rate improved

## 2022-02-19 NOTE — Assessment & Plan Note (Signed)
Cellulitis over anterior chest over pacemaker site see photograph this was shown to Dr. Caryl Comes today by secure chat  Cardiology will follow-up on the wound next week and will send doxycycline 100 mg twice daily for 7 days

## 2022-02-19 NOTE — Progress Notes (Addendum)
Established Patient Office Visit  Subjective:  Patient ID: Edward Butler, male    DOB: 1941/04/01  Age: 81 y.o. MRN: [id number]  CC:  No chief complaint on file.   HPI  07/2021 Edward Butler presents for primary care  follow-up visit.  Note this patient had an episode about a week ago of falling while assisting his daughter at her home.  He landed on concrete and had abrasions on the nose forehead right lower extremity.  He treated these with first-aid at home did not seek attention.  Note he is on Eliquis.  He is denied any headaches nausea or vomiting since that time.  He was coming down with upper respiratory tract infection at that time and this progressed to the point where he was seen 2 days ago at urgent care and given a prescription for amoxicillin.  They felt he had right ear infection and early sinus infection.  On arrival blood pressure 119/82 hemoglobin A1c is 6.1 although his blood sugar on arrival was 249.  He brings a carefully produce record of all of his blood sugars and including what meals he ate and what type of food he was eating.  It is important note that for the last week his postprandial blood sugars have been in the 200+ range whereas prior to this they had only been in the 1 60-1 80 range.  Fasting sugars had been in the 08/23/1928 range but now more recent in the 1 40-1 60 range.   The patient is already had his flu vaccine for the season he has no other complaints  11/2021 Patient returns in follow-up after having been hospitalized twice in March below are documentations from discharge summaries. Note patient has had completion of all of his pneumonia vaccines there was a question whether he needed a Pneumovax but he received this already in November 2021.  Patient had atrial fibrillation and pneumonia of the pneumonia cleared out however when the atrial fibrillation was treated he became bradycardic had to be readmitted.  Patient is now off all heart rate controller  therapy and he has an upcoming appointment with cardiology.  Patient needs a foot exam at this visit.  Patient is requesting a DMV handicap placard.  Patient complains of toenail fungus.  Patient questioning whether needs a shingles vaccine we determined he had 2 vaccines for zoster in 2019 and 2020.  Patient only has a slight cough at this time.  He does use CPAP at night.  He questions whether he can come off the CPAP machine.   Below are the discharge summaries. Admit date:     10/20/2021  Discharge date: 10/21/21  Discharge Physician: Patrecia Pour    PCP: Elsie Stain, MD    Recommendations at discharge:  Follow up with [facility] cardiology, Dr. Salvadore Oxford for PAF. Treated here for AFib w/RVR which converted to NSR with addition of metoprolol 33m po BID. Follow up with PCP, Dr. WJoya Gaskinsas scheduled 4/20 with plans to repeat CXR at that time after trial of antibiotics for presumptive pneumonia. Cautioned patient on importance of this follow up to rule out persistent opacity which would necessitate further work up for possible cancer.   Discharge Diagnoses: Principal Problem:   Paroxysmal atrial fibrillation with RVR ([place] Active Problems:   CAP (community acquired pneumonia)   Benign essential HTN   Type 2 diabetes mellitus without complication, without long-term current use of insulin (HCC)   Hypothyroidism   Dyslipidemia   Aortic stenosis,  moderate   OSA on CPAP   BPH (benign prostatic hyperplasia)   Hospital Course: Edward Butler is an 81 y.o. male with a history of PAF on eliquis, AS, CVA, T2DM, HLD, OSA on CPAP who presented to the ED 3/20 with cough and fever. He was febrile to 101.38F, in AFib with RVR and not hypoxic. CXR demonstrated left and possible right perihilar density suspicious for pneumonia in setting of fever, though inconclusive. Antibiotics, IV fluid were administered. Cardiology consulted and patient initiated on diltiazem infusion with eventual  conversion to NSR around 4:20am the following morning. He remained in sinus rhythm without fever and has no hypoxia or dyspnea on exertion. He will be discharged in stable condition with plans for PCP and cardiology follow up and completion of oral antibiotics and addition of metoprolol.   Assessment and Plan: * Paroxysmal atrial fibrillation with RVR (HCC) RVR is asymptomatic, precipitated by pneumonia. Converted to NSR on diltiazem infusion. Restarted home diltiazem per cardiology who also added metoprolol tartrate 25mg  po BID.  - Continue eliquis, hasn't missed doses   CAP (community acquired pneumonia) Presumptive diagnosis with CXR opacity, acute productive cough and fever. Viral work up negative. Urine antigen studies negative thus far.  - Sputum culture pending, blood cultures NGTD. Will monitor after discharge.  - Complete typical antibiotic course and repeat CXR at follow up to ensure resolution. Pt also has no subacute/chronic systemic symptoms of malignancy. Discussed he may need CT chest if still opacity at follow up.   Type 2 diabetes mellitus without complication, without long-term current use of insulin (HCC) HbA1c 6.1% on 07/2021. Continue metformin. Also on ACEi and statin at home.   Benign essential HTN BP normotensive, continue enalapril, new metoprolol, and home diltiazem CR   Hypothyroidism TSH wnl. Continue home synthroid     Dyslipidemia Continue zocor    Aortic stenosis, moderate Moderate AS based off last echo from OV note with his cardiologist.    OSA on CPAP On cpap    BPH (benign prostatic hyperplasia) Continue proscar    Consultants: Cardiology, Dr. Marlou Porch. Procedures performed: None  Admit date: 10/27/2021 Discharge date: 10/29/2021   PCP:  Elsie Stain, MD              [place] Cardiologist:  Dina Rich, MD   >>> Dr. Marlou Porch (patient decided to follow-up with [facility])     Discharge Diagnoses    Principal  Problem:   Symptomatic bradycardia Paroxysmal atrial fibrillation Moderate to severe aortic stenosis Hypertension with history of hypertension Hyperlipidemia Elevated troponin   Diagnostic Studies/Procedures    Echocardiogram 10/29/21 1. The aortic valve is abnormal. There is severe calcifcation of the  aortic valve. Aortic valve regurgitation is mild. Moderate to severe  aortic valve stenosis. Aortic valve area, by VTI measures 0.99 cm. Aortic  valve mean gradient measures 36.5 mmHg.  Aortic valve Vmax measures 3.89 m/s.   2. Left ventricular ejection fraction, by estimation, is 65 to 70%. The  left ventricle has normal function. The left ventricle has no regional  wall motion abnormalities. There is moderate asymmetric left ventricular  hypertrophy of the basal-septal  segment. Left ventricular diastolic parameters are consistent with Grade I  diastolic dysfunction (impaired relaxation).   3. Right ventricular systolic function is normal. The right ventricular  size is normal. There is normal pulmonary artery systolic pressure. The  estimated right ventricular systolic pressure is 72.0 mmHg.   4. Left atrial size was mild to moderately dilated.  5. The mitral valve is degenerative. Mild mitral valve regurgitation. No  evidence of mitral stenosis.   6. The inferior vena cava is normal in size with greater than 50%  respiratory variability, suggesting right atrial pressure of 3 mmHg.    History of Present Illness     Edward Butler is a 81 y.o. male with hx of aortic stenosis, HTN, PAF, DM, HLD, hypothyroidism, obstructive sleep apnea on CPAP and BPH admitted with symptomatic bradycardia.   He followed regularly by Dr[facility]Bohle at [facility] with paroxysmal atrial fibrillation on Eliquis, hypertension, moderate aortic stenosis.   Never smoked. No early family history of coronary artery disease.   Admitted 10/20/21-10/21/21 for community-acquired pneumonia and atrial  fibrillation with rapid ventricular rate.  Felt elevated heart rate precipitated by pneumonia.  Converted to sinus rhythm.  Added metoprolol tartrate 25 mg twice daily on top of regular verapamil 240 mg daily.  Discharged on p.o. antibiotic.   After discharge he was recovering well with some episode of lightheadedness.  Had worsening episode 3/27.  EMS was called and found to be in atrial fibrillation with slow ventricular rate in 30s.  He was given fluids in route.  Upon arrival to ER, he was given atropine and felt much better afterwards.  Heart rate improved to 50s with blood pressure of 101/58.  Lactic acid 3.2.  Admitted under cardiology service for further evaluation.   Hospital Course     Consultants: None   Symptomatic bradycardia  - Recent afib RVR in setting of pneumonia. Discharged on metoprolol tartrate 44m BID (new) with home Verapamil 2466mCD. Admitted with symptomatic bradycardia in 30-40s. HR improved after atropine.  Heart rate remained stable in 60s without recurrent bradycardia or dizziness. - TSH normal -Echocardiogram normal LV function -Discontinued metoprolol and verapamil.  Dr. RoHarrington Challengerid not felt need of short acting rate control agent.  Placed on live monitor for further evaluation and discharge.   2. PAF -Maintaining sinus rhythm throughout admission.  Discontinued rate control agent as above. - Continue Eliquis    3. Elevated troponin  - 285>>258 - Lactic acid improved after hydration - Likely demand ischemia due to symptomatic bradycardia and hypotension    4. Aortic stenosis - Moderate to severe by last echo in 03/2021.  -Echo this admission showed LV function of 65 to 70% and grade 1 diastolic dysfunction.  No regional wall motion abnormality.  Moderate to severe aortic stenosis with mean gradient of 36.5 mmHg. Aortic valve area, by VTI measures 0.99 cm.      5. Hypotension with hx of HTN - BP improved with holding BB and CCB as above.  Hydration was given.   Recommended to resume ACE when systolic blood pressure greater then 120.   6. HLD - Continue statin     7.  Recent pneumonia -Repeat chest x-ray prior to discharge showed no acute finding.  Resolution of pneumonia.   Patient wants to follow-up with our service with Dr. SkMarlou Porchor easy transportation.   The patient been seen by Dr. RoHarrington Challengeroday and deemed ready for discharge home. All follow-up appointments have been scheduled. Discharge medications are listed below.     Did the patient have an acute coronary syndrome (MI, NSTEMI, STEMI, etc) this admission?:  No                               Did the patient have a percutaneous coronary intervention (  stent / angioplasty)?:  No.     Note patient is transitioning to [facility] heart care for his cardiology.   7/20 Multiple readmits after PPM placement and complications with lead placements and medication side effects  Since this patient was last seen he had a pacemaker lead placement revision documentation of that admission as below on 16 June.  The patient has had difficulty with atrial fibrillation paroxysmal rapid ventricular response multiple medications have been tried with multiple side effects.  He could not tolerate verapamil or metoprolol.  Patient also microperforation of the posterior atrium on the left side seen on echocardiogram and another admission occurred in June 28 with hypotension.  The pericardial effusion was drained on June 30.  Patient has had at least 1 return visit July 11 with cardiology.  Patient is now on amiodarone along with diltiazem with improvement in heart rate.  Patient's shortness of breath has improved.  He has been eating more carbohydrates and this is resulted in elevations in his blood sugar.  His A1c was 5.9 during the hospitalization.  He is only on the metformin at this time.  Note patient is also on furosemide daily.  He has had his Proscar held while he is on his other heart medications.  Currently he is  urinating well and is not suffering problems from his BPH.  He brings a log of his blood pressures and they have ranged from the 150 over 80s now down to 117/73 at this visit.  He has maintained the furosemide does maintain the diltiazem.  Patient continues his amiodarone load.  He has follow-up with cardiology in a week.  There has been a concern of redness and irritation at the pacemaker site.  Photograph is as noted below.  It is slightly tender but not purulent at this time.  Patient also is concerned about the aortic stenosis.  This has been documented on his echocardiogram the question becomes what he eventually need a valve replacement.  He knows that cardiology is following this up in the structural heart failure team is following this.  Patient has questions about when he should get his next COVID-vaccine booster as well.  Hosp Admit 01/16/22 Discharge Diagnoses    Principal Problem:   Pacemaker complications Active Problems:   Essential hypertension   Chronic anticoagulation   Paroxysmal atrial fibrillation with RVR (HCC)   Tachycardia-bradycardia syndrome (HCC)       Diagnostic Studies/Procedures    PPM Lead Revision Conclusion: Successful removal of a previously implanted RV pacing lead and repositioning of the right atrial pacing lead with insertion of a new right ventricular pacing lead in a patient who developed severe pleuritic chest pain which was incessant 2 days after initial pacemaker insertion consistent with microperforation.   Edward Peru, MD _____________   History of Present Illness     Edward Butler is a 81 y.o. 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   He recently underwent outpatient PPM implantation 01/14/2022 by Dr.  Caryl Comes. He was called 6/15 by device clinic and was doing well without complaints or pain, planned to start [place] back 01/17/22 as previously ordered. However later the evening of 01/15/22, he began to have pleuritic type CP that persisted into the morning so came to the ED on 01/16/22. In the ED,  CXR showed low lung volumes, and subsegmental atelectasis; mild cardiomegaly, aortic atherosclerosis. CT angio with no PE, small pericardial effusion. Labs showed K 4.4, Cr 0.92, WBG 8.7, Hgb 14.8, HS trop 23, Lactic Acid 1.7. Temp 99.2 on arrival. EKG showed AF RVR. PPM site looked stable. Steri-strips had been removed from ED provider. He was treated with IV diltiazem. As he was in atrial fib, could not assess his RA pacing threshold. The RA impedence was down 100 ohms of unknown significance. He was admitted to the EP service for probable PM lead microperforation.      Hospital Course     He was taken to the EP lab and underwent successful removal of a previously implanted RV pacing lead and repositioning of the right atrial pacing lead with insertion of a new right ventricular pacing lead. His tachycardia resolved. His home medication, to include verapamil, was resumed. This morning his pleuritic pain was better and heart rate had improved. He did have soft blood pressure this morning after verapamil. Dr. Lovena Le recommended IV fluids and reassessment, with discharge if this stabilized. His BP has improved. CXR showed bibasilar atelectasis and mild blunting of the costophrenic angles likely due to trace pleural effusions. No pneumothorax or evidence of complications. Respiratory rate has been intermittently above normal but patient has tendency to take shallow breaths and this has been observed per VS review in other admissions as well. Most recent RR 24. He feels great this afternoon and is eager to go home, with normal O2 sat and clear lungs. Dr. Lovena Le reviewed the above and does not feel any additional testing is  needed. He has seen and examined the patient today and feels he is stable for discharge. He feels the patient can restart his Eliquis (apixaban) on 01/21/22. He reports no contrast was used so did not need to hold metformin. He otherwise did not advise any other new medication changes. EP team has outlined post-revision instructions on AVS and arranged f/u with wound check and usual EP f/u. The patient also has a follow-up visit scheduled with Dr. Harrington Challenger in August.    [place] 01/28/22: Admit date: 01/28/2022 Discharge date: 02/04/2022   PCP:  Elsie Stain, MD              [place] Prov[facility] Cardiologist:  Dina Rich, MD  Electrophysiologist:  Virl Axe, MD         Discharge Diagnoses    Principal Problem:   Hypovolemic shock [facility]) Active Problems:   Severe aortic stenosis   Essential hypertension   Chronic anticoagulation   Dyslipidemia   Paroxysmal atrial fibrillation with RVR ([place])   Type 2 diabetes mellitus without complication, without long-term current use of insulin (HCC)   OSA on CPAP   BPH (benign prostatic hyperplasia)   Hypothyroidism   Complication associated with cardiac pacemaker lead   Tachycardia-bradycardia syndrome ([place])   AKI (acute kidney injury) ([place])   Numbness of left lower extremity   Lactic acidosis   Transaminitis   Pericardial effusion with cardiac tamponade   History of cardiac pacemaker   Chronic diastolic CHF (congestive heart failure) (HCC)   Cardiac tamponade   Pericardial tamponade   Malnutrition of moderate degree        History of Present Illness     Edward Butler is a 81 y.o. male with PMH of DM 2, HTN, CVA, atrial fibrillation, pacemaker implant with lead revision/repair on 01/16/2022.  And presented to ER after visit to EP clinic with  complication of lead Revision/repair with microperforation of the presumably atrial lead with repositioning of both leads.  In the EP clinic he complained of orthopnea and nocturnal dyspnea  and found to be in a fib with RVR.  No lower ext edema.  From pacer interrogation was in a fib for about 26 hours on that visit.  BB added and after taking at home felt weak and clammy.  Called EMS with hypotension - after 200 cc NS BP up to 976 systolic.    In ER BP to 73A systolic and pressors started.  IV fluids given as well.  Initial lactate was 8 but improved to 5.  HR which had been elevated was in 60s in ER in V paced rhythm on EKG.     CT abd with possible pancreatitis, no PE.  CT brain showed Focal area of increased attenuation in the right parietal region. Also small to moderate pericardial effusion that was stable since 01/16/22.Marland Kitchen  pt admitted with plans for tele, hold lasix and BB, blood cultures with recent procedure.        Hospital Course     Consultants: structural heart team. Dr. Burt Knack    01/29/22 his AS was now severe and with mild to mod MR.  His BP was improved his echo with mod effusion with some RV collapse but no tamponade.       01/30/22 was orthopneic and found to be in a fib with RVR.  IV amiodarone started.  His PPM had normal device function.   Repeat echo done and now with tamponade criteria.  Eliquis which he was on for PAF was held.   Dr. Burt Knack saw for his severe AS as well.   With his new echo changes and pt still dyspneic concern for hemorrhagic effusion.  If was felt pericardiocentesis was needed due to risk of of further hemodynamic compromise especially in the setting of his comorbidities and known severe aortic stenosis   Pt underwent pericardiocentesis removing over 510 cc blood.  Drain left in place and pt to ICU.  Apixaban on hold and plan to resume as outpt on 02/07/22 Saturday.  he developed some mild CHF and IV lasix given as needed over next 3 days.     Follow up echo with trivial effusion, no significant drainage from drain.  Pericardial drain removed 02/01/22.     Pt converted to SR on 02/02/22.  He was transitioned to po amiodarone. Amiodarone 400 mg BID  until Monday then 200 mg BID.   Today we was seen and eluated by Dr. Harrington Challenger and his echo was with trivial pericardial effusion.   Will resume eliquis on Sat the 8th.  For his chronic anticoagulation for PAF    His Severe AS has been evaluated by structural heart team and will follow him as outpt.     His acute on chronic diastolic HF was stable today and he will continue lasix 40 mg daily.  He is neg 1709 since admit and wt at discharge 68.4 Kg down from pk of 74.9 Kg.    His pacer site without infection.     He will follow up with EP, PA next week and then Dr. Harrington Challenger and Dr. Burt Knack in August.     His diabetes -2 was covered by SSI here and is stable mostly  we will resume his metformin.  His A1c was 5.9 stable, continue heart and carb modified diet.    His hypothyroid covered by synthroid.and TSH 1.947.  For his OSA he wore CPAP most nights.   Originally transaminitis with normalized LFTs at discharge.   Holding the vasotec and BB, BP is soft here at times.  He is on zocor but low dose  At discharge changed dilt 60 every 6 hours to 180 mg CD.  And I held his Proscar until seen in the office. He will call if any issues passing his urine.       Past Medical History:  Diagnosis Date   Aneurysm ([place])    Atrial fibrillation ([place])    CAP (community acquired pneumonia) 10/20/2021   Cardiac tamponade    Diabetes mellitus without complication ([place])    Hypertension    Non-recurrent acute suppurative otitis media of right ear without spontaneous rupture of tympanic membrane 07/04/2021   Stroke [facility])     Past Surgical History:  Procedure Laterality Date   LEAD REVISION/REPAIR N/A 01/16/2022   Procedure: LEAD REVISION/REPAIR;  Surgeon: Evans Lance, MD;  Location: [place] [facility];  Service: Cardiovascular;  Laterality: N/A;   PACEMAKER IMPLANT N/A 01/14/2022   Procedure: PACEMAKER IMPLANT;  Surgeon: Deboraha Sprang, MD;  Location: [place] [facility];  Service: Cardiovascular;   Laterality: N/A;   PERICARDIOCENTESIS N/A 01/30/2022   Procedure: PERICARDIOCENTESIS;  Surgeon: Burnell Blanks, MD;  Location: [place] [facility];  Service: Cardiovascular;  Laterality: N/A;   skin cancer surgeries       No family history on file.  Social History   Socioeconomic History   Marital status: Married    Spouse name: Not on file   Number of children: Not on file   Years of education: Not on file   Highest education level: Not on file  Occupational History   Not on file  Tobacco Use   Smoking status: Never   Smokeless tobacco: Never  Vaping Use   Vaping Use: Never used  Substance and Sexual Activity   Alcohol use: Not Currently   Drug use: Not Currently   Sexual activity: Not Currently  Other Topics Concern   Not on file  Social History Narrative   Not on file   Social Determinants of Health   Financial Resource Strain: Not on file  Food Insecurity: Not on file  Transportation Needs: Not on file  Physical Activity: Not on file  Stress: Not on file  Social Connections: Not on file  Intimate Partner Violence: Not on file    Outpatient Medications Prior to Visit  Medication Sig Dispense Refill   Accu-Chek FastClix Lancets MISC Use to check blood sugar once daily. E11.9 100 each 2   acetaminophen (TYLENOL) 500 MG tablet Take 1,000 mg by mouth daily as needed for moderate pain.     amiodarone (PACERONE) 200 MG tablet Take 2 tablets (400 mg total) by mouth 2 (two) times daily. X 1 week. Than two (2) tablets (400 mg) daily X 2 weeks. Than one(1) tablet by mouth (200 mg) daily. 120 tablet 1   apixaban (ELIQUIS) 5 MG TABS tablet Take 1 tablet (5 mg total) by mouth 2 (two) times daily. 60 tablet    Blood Glucose Monitoring Suppl (ACCU-CHEK GUIDE ME) w/Device KIT USE AS DIRECTED 1 kit 0   Calcium Carbonate-Vitamin D (CALCIUM 600+D PO) Take 2 tablets by mouth daily.     cholecalciferol (VITAMIN D3) 25 MCG (1000 UNIT) tablet Take 1,000 Units by mouth daily.      diltiazem (CARDIZEM CD) 180 MG 24 hr capsule Take 1 capsule (180 mg  total) by mouth daily. 30 capsule 2   furosemide (LASIX) 40 MG tablet Take 1 tablet (40 mg total) by mouth daily. 30 tablet 1   glucose blood (ACCU-CHEK GUIDE) test strip USE TO CHECK BLOOD SUGAR ONCE DAILY. 100 strip 1   levothyroxine (SYNTHROID) 25 MCG tablet Take 25 mcg by mouth daily before breakfast.     magnesium oxide (MAG-OX) 400 MG tablet Take 400 mg by mouth daily.     metFORMIN (GLUCOPHAGE-XR) 500 MG 24 hr tablet Take 2 tablets (1,000 mg total) by mouth 2 (two) times daily. 360 tablet 0   simvastatin (ZOCOR) 10 MG tablet Take 10 mg by mouth daily.     No facility-administered medications prior to visit.    Allergies  Allergen Reactions   Metoprolol Other (See Comments)    Pt took two different times and both made him feel bad, the second time hypotensive. He prefers not to take    ROS Review of Systems  Constitutional:  Negative for chills, diaphoresis and fever.  HENT:  Negative for congestion, ear pain, hearing loss, nosebleeds, rhinorrhea, sinus pressure, sinus pain, sneezing, sore throat, tinnitus and trouble swallowing.   Eyes:  Negative for photophobia and redness.  Respiratory:  Negative for cough, shortness of breath, wheezing and stridor.        Mucus yellow  Cardiovascular:  Negative for chest pain, palpitations and leg swelling.  Gastrointestinal:  Negative for abdominal pain, blood in stool, constipation, diarrhea, nausea and vomiting.  Endocrine: Negative for polydipsia and polyuria.  Genitourinary: Negative.  Negative for dysuria, flank pain, frequency, hematuria and urgency.  Musculoskeletal:  Negative for back pain, myalgias and neck pain.  Skin:  Positive for wound. Negative for rash.  Allergic/Immunologic: Negative for environmental allergies.  Neurological:  Negative for dizziness, tremors, seizures, syncope, facial asymmetry, speech difficulty, weakness, light-headedness, numbness and  headaches.  Hematological:  Does not bruise/bleed easily.  Psychiatric/Behavioral: Negative.  Negative for suicidal ideas. The patient is not nervous/anxious.       Objective:    Physical Exam Vitals reviewed.  Constitutional:      Appearance: Normal appearance. He is well-developed. He is not diaphoretic.  HENT:     Head: Normocephalic and atraumatic.     Right Ear: Tympanic membrane, ear canal and external ear normal. There is no impacted cerumen.     Left Ear: Tympanic membrane, ear canal and external ear normal. There is no impacted cerumen.     Nose: No nasal deformity, septal deviation, mucosal edema, congestion or rhinorrhea.     Right Sinus: No maxillary sinus tenderness or frontal sinus tenderness.     Left Sinus: No maxillary sinus tenderness or frontal sinus tenderness.     Mouth/Throat:     Mouth: Mucous membranes are moist.     Pharynx: Oropharynx is clear. No oropharyngeal exudate.  Eyes:     General: No scleral icterus.    Conjunctiva/sclera: Conjunctivae normal.     Pupils: Pupils are equal, round, and reactive to light.  Neck:     Thyroid: No thyromegaly.     Vascular: No carotid bruit or JVD.     Trachea: Trachea normal. No tracheal tenderness or tracheal deviation.  Cardiovascular:     Rate and Rhythm: Normal rate and regular rhythm.     Chest Wall: PMI is not displaced.     Pulses: Normal pulses. No decreased pulses.     Heart sounds: S1 normal and S2 normal. Heart sounds not distant. Murmur heard.  No systolic murmur is present.     No diastolic murmur is present.     No friction rub. No gallop. No S3 or S4 sounds.     Comments: Irreg irreg rate 90 Murmur of AS Pulmonary:     Effort: Pulmonary effort is normal. No tachypnea, accessory muscle usage or respiratory distress.     Breath sounds: Normal breath sounds. No stridor. No decreased breath sounds, wheezing, rhonchi or rales.  Chest:     Chest wall: No tenderness.  Abdominal:     General: Bowel  sounds are normal. There is no distension.     Palpations: Abdomen is soft. Abdomen is not rigid.     Tenderness: There is no abdominal tenderness. There is no guarding or rebound.  Musculoskeletal:        General: Normal range of motion.     Cervical back: Normal range of motion and neck supple. No edema, erythema or rigidity. No muscular tenderness. Normal range of motion.     Comments: Foot exam shows significant toenail fungus bilaterally in both great toes otherwise normal  Lymphadenopathy:     Head:     Right side of head: No submental or submandibular adenopathy.     Left side of head: No submental or submandibular adenopathy.     Cervical: No cervical adenopathy.  Skin:    General: Skin is warm and dry.     Coloration: Skin is not pale.     Findings: Erythema present. No rash.     Nails: There is no clubbing.     Comments: See photo of PPM insertion wound site,  no purulence, erythema only     Neurological:     Mental Status: He is alert and oriented to person, place, and time.     Sensory: No sensory deficit.  Psychiatric:        Mood and Affect: Mood normal.        Speech: Speech normal.        Behavior: Behavior normal.        Thought Content: Thought content normal.        Judgment: Judgment normal.      Media Information   Document Information  Photos  Current PPM site  02/19/2022 10:30  Attached To:  Office Visit on 02/19/22 with Elsie Stain, MD   Source Information  Elsie Stain, MD  [facility]   BP 117/73 (BP Location: Left Arm, Patient Position: Sitting, Cuff Size: Normal)   Pulse 91   Temp 98.1 F (36.7 C) (Oral)   Resp 16   Wt 150 lb (68 kg)   SpO2 98%   BMI 23.85 kg/m  Wt Readings from Last 3 Encounters:  02/19/22 150 lb (68 kg)  02/10/22 149 lb (67.6 kg)  02/04/22 150 lb 12.7 oz (68.4 kg)     Health Maintenance Due  Topic Date Due   URINE MICROALBUMIN  Never done   COVID-19 Vaccine (3 - Moderna series)  10/08/2021   OPHTHALMOLOGY EXAM  02/19/2022    There are no preventive care reminders to display for this patient.  Lab Results  Component Value Date   TSH 1.947 01/29/2022   Lab Results  Component Value Date   WBC 6.6 02/04/2022   HGB 14.5 02/04/2022   HCT 41.0 02/04/2022   MCV 86.9 02/04/2022   PLT 261 02/04/2022   Lab Results  Component Value Date   NA 135 02/04/2022   K 3.9 02/04/2022  CO2 22 02/04/2022   GLUCOSE 159 (H) 02/04/2022   BUN 14 02/04/2022   CREATININE 0.95 02/04/2022   BILITOT 0.8 02/01/2022   ALKPHOS 57 02/01/2022   AST 27 02/01/2022   ALT 63 (H) 02/01/2022   PROT 5.8 (L) 02/01/2022   ALBUMIN 2.6 (L) 02/01/2022   CALCIUM 8.4 (L) 02/04/2022   ANIONGAP 11 02/04/2022   EGFR 80 01/07/2022   Lab Results  Component Value Date   CHOL 141 12/05/2020   Lab Results  Component Value Date   HDL 43 12/05/2020   Lab Results  Component Value Date   LDLCALC 69 12/05/2020   Lab Results  Component Value Date   TRIG 171 (H) 12/05/2020   Lab Results  Component Value Date   CHOLHDL 3.3 12/05/2020   Lab Results  Component Value Date   HGBA1C 5.9 (H) 02/01/2022    Diagnostic Studies/Procedures    Echo 01/29/22  IMPRESSIONS     1. Left ventricular ejection fraction, by estimation, is 60 to 65%. The  left ventricle has normal function. The left ventricle has no regional  wall motion abnormalities. There is mild concentric left ventricular  hypertrophy. Left ventricular diastolic  parameters are consistent with Grade II diastolic dysfunction  (pseudonormalization). Elevated left ventricular end-diastolic pressure.   2. Right ventricular systolic function is mildly reduced. The right  ventricular size is normal. There is moderately elevated pulmonary artery  systolic pressure. The estimated right ventricular systolic pressure is  20.8 mmHg.   3. Left atrial size was mildly dilated.   4. Right atrial size was mildly dilated.   5. Pericardial  effusion measures 1.51cm posteriorly at greatest  diameter.. Moderate pericardial effusion. The pericardial effusion is  circumferential.   6. The mitral valve is degenerative. Mild to moderate mitral valve  regurgitation. No evidence of mitral stenosis. Moderate mitral annular  calcification.   7. The aortic valve is calcified. There is severe calcifcation of the  aortic valve. There is severe thickening of the aortic valve. Aortic valve  regurgitation is mild. Severe aortic valve stenosis. Aortic regurgitation  PHT measures 485 msec. Aortic  valve area, by VTI measures 0.74 cm. Aortic valve mean gradient measures  42.0 mmHg. Aortic valve Vmax measures 4.05 m/s.   8. The inferior vena cava is normal in size with greater than 50%  respiratory variability, suggesting right atrial pressure of 3 mmHg.   9. Compared to study dated 10/29/2021, the mean AVG has increased from  36.36mg to 433mg, DVI has decreased from 0.35 to 0.24, Vmax has increased  from 3.8859mto 4.27m44mnd AVA has decreased from 0.99cm2 to 0.74cm2 all  consistent with now severe AS.   FINDINGS   Left Ventricle: Left ventricular ejection fraction, by estimation, is 60  to 65%. The left ventricle has normal function. The left ventricle has no  regional wall motion abnormalities. The left ventricular internal cavity  size was normal in size. There is   mild concentric left ventricular hypertrophy. Left ventricular diastolic  parameters are consistent with Grade II diastolic dysfunction  (pseudonormalization). Elevated left ventricular end-diastolic pressure.   Right Ventricle: The right ventricular size is normal. No increase in  right ventricular wall thickness. Right ventricular systolic function is  mildly reduced. There is moderately elevated pulmonary artery systolic  pressure. The tricuspid regurgitant  velocity is 2.84 m/s, and with an assumed right atrial pressure of 15  mmHg, the estimated right ventricular  systolic pressure is 47.302.2g.   Left Atrium: Left  atrial size was mildly dilated.   Right Atrium: Right atrial size was mildly dilated.   Pericardium: Pericardial effusion measures 1.51cm posteriorly at greatest  diameter. A moderately sized pericardial effusion is present. The  pericardial effusion is circumferential.   Mitral Valve: The mitral valve is degenerative in appearance. There is  mild calcification of the mitral valve leaflet(s). Moderate mitral annular  calcification. Mild to moderate mitral valve regurgitation. No evidence of  mitral valve stenosis. MV peak  gradient, 8.9 mmHg. The mean mitral valve gradient is 3.0 mmHg.   Tricuspid Valve: The tricuspid valve is normal in structure. Tricuspid  valve regurgitation is mild . No evidence of tricuspid stenosis.   Aortic Valve: The aortic valve is calcified. There is severe calcifcation  of the aortic valve. There is severe thickening of the aortic valve.  Aortic valve regurgitation is mild. Aortic regurgitation PHT measures 485  msec. Severe aortic stenosis is  present. Aortic valve mean gradient measures 42.0 mmHg. Aortic valve peak  gradient measures 65.6 mmHg. Aortic valve area, by VTI measures 0.74 cm.   Pulmonic Valve: The pulmonic valve was normal in structure. Pulmonic valve  regurgitation is not visualized. No evidence of pulmonic stenosis.   Aorta: The aortic root is normal in size and structure.   Venous: The inferior vena cava is normal in size with greater than 50%  respiratory variability, suggesting right atrial pressure of 3 mmHg.   IAS/Shunts: No atrial level shunt detected by color flow Doppler.   Additional Comments: A device lead is visualized.    Limited Echo 01/30/22    1. Limited echo but known moderate MR and severe AS.   2. Left ventricular ejection fraction, by estimation, is 55 to 60%. The  left ventricle has normal function.   3. Pacing wires in RA/RV.   4. Compared to TTE done  yesterday effusion seems larger especially in  apical and subcostal views Patient in rapid afib but appears to have more  drop in mitral inflows with inspiration and RV diastolic collapse IVC  remains dilated over 2 as before Would  have concern for tamponade especially if on blood thinners for afib .  Large pericardial effusion. The pericardial effusion is circumferential.   FINDINGS   Left Ventricle: Left ventricular ejection fraction, by estimation, is 55  to 60%. The left ventricle has normal function. The left ventricular  internal cavity size was normal in size.   Right Ventricle: Pacing wires in RA/RV.   Pericardium: Compared to TTE done yesterday effusion seems larger  especially in apical and subcostal views Patient in rapid afib but appears  to have more drop in mitral inflows with inspiration and RV diastolic  collapse IVC remains dilated over 2 as  before Would have concern for tamponade especially if on blood thinners  for afib. A large pericardial effusion is present. The pericardial  effusion is circumferential.   Additional Comments: Limited echo but known moderate MR and severe AS.  Edward Rouge MD      Pericardiocentesis 01/30/22 _____________ Details Indication: Pericardial tamponade  Procedure: The risks, benefits, complications, treatment options, and expected outcomes were discussed with the patient. The patient and/or family concurred with the proposed plan, giving informed consent. The patient was brought to the cath lab. The chest wall was prepped and draped. 1% lidocaine was used for local anesthesia. I long micropuncture needle was then passed under the sub-xiphoid process into the pericardial space. The  needle was removed and the micro sheath was  passed over the wire. I then confirmed that we were in the pericardial space by injecting contrast which clearly outlined the pericardial space. I then placed the J wire into the pericardial space and dilated the  tract. A pigtail drainage catheter was advanced over the wire. 510 cc bloody fluid was removed. Limited echo confirmed resolution of the pericardial effusion. The drain was left in place and placed to suction.  Estimated blood loss <50 mL.   During this procedure no sedation was administered.        Echo 02/04/22 IMPRESSIONS     1. Trivial circumferential pericardial fluid with dense thick fibrous  tissue in the pericardial space with noted strands.   2. Left ventricular ejection fraction, by estimation, is 60 to 65%. The  left ventricle has normal function. There is moderate concentric left  ventricular hypertrophy. Left ventricular diastolic function could not be  evaluated.   3. Tricuspid regurgitation signal is inadequate for assessing PA  pressure.   4. The inferior vena cava is normal in size with greater than 50%  respiratory variability, suggesting right atrial pressure of 3 mmHg.   Comparison(s): A prior study was performed on 01/31/2022. No significant  change from prior study.   FINDINGS   Left Ventricle: Left ventricular ejection fraction, by estimation, is 60  to 65%. The left ventricle has normal function. There is moderate  concentric left ventricular hypertrophy. Left ventricular diastolic  function could not be evaluated.   Right Ventricle: Tricuspid regurgitation signal is inadequate for  assessing PA pressure.   Pericardium: Trivial circumferential pericardial fluid with dense thick  fibrous tissue in the pericardial space with noted strands.   Venous: The inferior vena cava is normal in size with greater than 50%  respiratory variability, suggesting right atrial pressure of 3 mmHg.   LEFT VENTRICLE  PLAX 2D  LVIDd:         4.70 cm  LVIDs:         3.80 cm  LV PW:         1.40 cm  LV IVS:        1.50 cm      IVC  IVC diam: 1.89 cm    Assessment & Plan:   Problem List Items Addressed This Visit       Cardiovascular and Mediastinum   Severe aortic  stenosis    Patient with questions as to whether he will have to have an aortic valve replacement he will follow-up with cardiology on this matter      Essential hypertension    Blood pressure actually slightly low but adequate at this time  Continue diltiazem and amiodarone  Proscar has been held without difficulty with BPH      Tachycardia-bradycardia syndrome ([place])    Management per cardiology currently tolerating diltiazem and amiodarone well  Pacemaker appears to be working better patient is working on trying to get a transmission to the cardiology office from the unit itself      Pericardial tamponade    Due to microperforation from atrial lead status post pericardiocentesis from cardiology at the last admission  Patient has a follow-up echocardiogram pending      RESOLVED: Cardiac tamponade    Resolved with drainage of pericardium        Respiratory   OSA on CPAP    Patient compliant on CPAP        Endocrine   Type 2 diabetes mellitus without complication, without long-term current use of insulin (  [place]) - Primary    Sugars running higher A1c was 5.9  Reassess metabolic panel and Y1V  Patient given lifestyle medicine handout  Continue metformin as prescribed  The following Lifestyle Medicine recommendations according to [facility]) were discussed and offered to patient who agrees to start the journey:  A. Whole Foods, Plant-based plate comprising of fruits and vegetables, plant-based proteins, whole-grain carbohydrates was discussed in detail with the patient.   A list for source of those nutrients were also provided to the patient.  Patient will use only water or unsweetened tea for hydration. C.  A full color page of  Calorie density of various food groups per pound showing examples of each food groups was provided to the patient.   Patient will need to reduce his walking until he is improved from a cardiovascular standpoint       Relevant Orders   Urine microalbumin-creatinine with uACR   Glucose (CBG) (Completed)   Hemoglobin A1c   Comprehensive metabolic panel   Hypothyroidism    Thyroid function stable continue Synthroid        Genitourinary   BPH (benign prostatic hyperplasia)    Hold Proscar for now      AKI (acute kidney injury) ([place])    Reassess metabolic panel may need to hold Lasix      Relevant Orders   CBC with Differential/Platelet     Other   Chronic anticoagulation    With recent pericardial bleed monitor CBC on Eliquis      Dyslipidemia    Continue statin      Symptomatic bradycardia    Heart rate improved      Transaminitis    Reassess metabolic panel      Cellulitis    Cellulitis over anterior chest over pacemaker site see photograph this was shown to Dr. Caryl Comes today by secure chat  Cardiology will follow-up on the wound next week and will send doxycycline 100 mg twice daily for 7 days      RESOLVED: Lactic acidosis    Resolved      RESOLVED: Hypovolemic shock ([place])    Resolved      Other Visit Diagnoses     Hypomagnesemia       Relevant Orders   Magnesium      Meds ordered this encounter  Medications   doxycycline (VIBRA-TABS) 100 MG tablet    Sig: Take 1 tablet (100 mg total) by mouth 2 (two) times daily for 7 days.    Dispense:  14 tablet    Refill:  0  60 minutes spent reviewing lifestyle medicine handout assessing multiple problems high risk of hospitalization assessing erythema over pacemaker site Follow-up: Return in about 1 month (around 03/22/2022).    Asencion Noble, MD

## 2022-02-19 NOTE — Assessment & Plan Note (Signed)
Patient with questions as to whether he will have to have an aortic valve replacement he will follow-up with cardiology on this matter

## 2022-02-19 NOTE — Progress Notes (Signed)
Concerns with high HR Concerns with doing a lot of walking. Unsure if he should or not.  Hyperglycemia  Does he need to take Covid 19 vaccine or wait until Fall season  Opinion on heart valve surgery  Irritation where pacemaker was placed.

## 2022-02-19 NOTE — Telephone Encounter (Signed)
Transmission received   Presenting Rhythm SVT with 2:1 conduction, looks like his ventricular rate averages around 87-105bpm.

## 2022-02-19 NOTE — Assessment & Plan Note (Signed)
Management per cardiology currently tolerating diltiazem and amiodarone well  Pacemaker appears to be working better patient is working on trying to get a transmission to the cardiology office from the unit itself

## 2022-02-19 NOTE — Assessment & Plan Note (Signed)
Due to microperforation from atrial lead status post pericardiocentesis from cardiology at the last admission  Patient has a follow-up echocardiogram pending

## 2022-02-19 NOTE — Assessment & Plan Note (Signed)
Hold Proscar for now

## 2022-02-19 NOTE — Assessment & Plan Note (Signed)
Resolved with drainage of pericardium

## 2022-02-19 NOTE — Assessment & Plan Note (Signed)
Reassess metabolic panel may need to hold Lasix

## 2022-02-19 NOTE — Telephone Encounter (Signed)
We will see the patient next Tuesday: Spoke to him

## 2022-02-20 LAB — CBC WITH DIFFERENTIAL/PLATELET
Basophils Absolute: 0 10*3/uL (ref 0.0–0.2)
Basos: 0 %
EOS (ABSOLUTE): 0.1 10*3/uL (ref 0.0–0.4)
Eos: 1 %
Hematocrit: 48.8 % (ref 37.5–51.0)
Hemoglobin: 16.4 g/dL (ref 13.0–17.7)
Immature Grans (Abs): 0 10*3/uL (ref 0.0–0.1)
Immature Granulocytes: 0 %
Lymphocytes Absolute: 1.5 10*3/uL (ref 0.7–3.1)
Lymphs: 18 %
MCH: 30.3 pg (ref 26.6–33.0)
MCHC: 33.6 g/dL (ref 31.5–35.7)
MCV: 90 fL (ref 79–97)
Monocytes Absolute: 0.5 10*3/uL (ref 0.1–0.9)
Monocytes: 6 %
Neutrophils Absolute: 5.8 10*3/uL (ref 1.4–7.0)
Neutrophils: 75 %
Platelets: 229 10*3/uL (ref 150–450)
RBC: 5.42 x10E6/uL (ref 4.14–5.80)
RDW: 13.6 % (ref 11.6–15.4)
WBC: 7.9 10*3/uL (ref 3.4–10.8)

## 2022-02-20 LAB — COMPREHENSIVE METABOLIC PANEL
ALT: 24 IU/L (ref 0–44)
AST: 23 IU/L (ref 0–40)
Albumin/Globulin Ratio: 1.7 (ref 1.2–2.2)
Albumin: 4.7 g/dL (ref 3.7–4.7)
Alkaline Phosphatase: 73 IU/L (ref 44–121)
BUN/Creatinine Ratio: 20 (ref 10–24)
BUN: 24 mg/dL (ref 8–27)
Bilirubin Total: 0.6 mg/dL (ref 0.0–1.2)
CO2: 21 mmol/L (ref 20–29)
Calcium: 10.4 mg/dL — ABNORMAL HIGH (ref 8.6–10.2)
Chloride: 97 mmol/L (ref 96–106)
Creatinine, Ser: 1.2 mg/dL (ref 0.76–1.27)
Globulin, Total: 2.7 g/dL (ref 1.5–4.5)
Glucose: 230 mg/dL — ABNORMAL HIGH (ref 70–99)
Potassium: 4.1 mmol/L (ref 3.5–5.2)
Sodium: 138 mmol/L (ref 134–144)
Total Protein: 7.4 g/dL (ref 6.0–8.5)
eGFR: 60 mL/min/{1.73_m2} (ref >59)

## 2022-02-20 LAB — HEMOGLOBIN A1C
Est. average glucose Bld gHb Est-mCnc: 140 mg/dL
Hgb A1c MFr Bld: 6.5 % — ABNORMAL HIGH (ref 4.8–5.6)

## 2022-02-20 LAB — MICROALBUMIN / CREATININE URINE RATIO
Creatinine, Urine: 69 mg/dL
Microalb/Creat Ratio: 18 mg/g creat (ref 0–29)
Microalbumin, Urine: 12.6 ug/mL

## 2022-02-20 LAB — MAGNESIUM: Magnesium: 1.8 mg/dL (ref 1.6–2.3)

## 2022-02-21 ENCOUNTER — Emergency Department (HOSPITAL_COMMUNITY): Payer: Medicare HMO | Admitting: Anesthesiology

## 2022-02-21 ENCOUNTER — Emergency Department (EMERGENCY_DEPARTMENT_HOSPITAL): Payer: Medicare HMO | Admitting: Anesthesiology

## 2022-02-21 ENCOUNTER — Emergency Department (HOSPITAL_COMMUNITY): Payer: Medicare HMO

## 2022-02-21 ENCOUNTER — Encounter (HOSPITAL_COMMUNITY): Payer: Self-pay

## 2022-02-21 ENCOUNTER — Inpatient Hospital Stay (HOSPITAL_COMMUNITY): Payer: Medicare HMO

## 2022-02-21 ENCOUNTER — Inpatient Hospital Stay (HOSPITAL_COMMUNITY): Admission: EM | Disposition: E | Payer: Self-pay | Source: Home / Self Care | Attending: Neurological Surgery

## 2022-02-21 ENCOUNTER — Inpatient Hospital Stay (HOSPITAL_COMMUNITY)
Admission: EM | Admit: 2022-02-21 | Discharge: 2022-03-03 | DRG: 025 | Disposition: E | Payer: Medicare HMO | Attending: Neurological Surgery | Admitting: Neurological Surgery

## 2022-02-21 DIAGNOSIS — G4730 Sleep apnea, unspecified: Secondary | ICD-10-CM | POA: Diagnosis not present

## 2022-02-21 DIAGNOSIS — I10 Essential (primary) hypertension: Secondary | ICD-10-CM | POA: Diagnosis not present

## 2022-02-21 DIAGNOSIS — R55 Syncope and collapse: Secondary | ICD-10-CM | POA: Diagnosis not present

## 2022-02-21 DIAGNOSIS — I509 Heart failure, unspecified: Secondary | ICD-10-CM | POA: Diagnosis not present

## 2022-02-21 DIAGNOSIS — Z8673 Personal history of transient ischemic attack (TIA), and cerebral infarction without residual deficits: Secondary | ICD-10-CM | POA: Diagnosis not present

## 2022-02-21 DIAGNOSIS — S066XAA Traumatic subarachnoid hemorrhage with loss of consciousness status unknown, initial encounter: Secondary | ICD-10-CM | POA: Diagnosis present

## 2022-02-21 DIAGNOSIS — I700 Atherosclerosis of aorta: Secondary | ICD-10-CM | POA: Diagnosis not present

## 2022-02-21 DIAGNOSIS — G9389 Other specified disorders of brain: Secondary | ICD-10-CM | POA: Diagnosis not present

## 2022-02-21 DIAGNOSIS — S0291XA Unspecified fracture of skull, initial encounter for closed fracture: Secondary | ICD-10-CM

## 2022-02-21 DIAGNOSIS — Y92009 Unspecified place in unspecified non-institutional (private) residence as the place of occurrence of the external cause: Secondary | ICD-10-CM | POA: Diagnosis not present

## 2022-02-21 DIAGNOSIS — R402142 Coma scale, eyes open, spontaneous, at arrival to emergency department: Secondary | ICD-10-CM | POA: Diagnosis present

## 2022-02-21 DIAGNOSIS — S065X9A Traumatic subdural hemorrhage with loss of consciousness of unspecified duration, initial encounter: Principal | ICD-10-CM | POA: Diagnosis present

## 2022-02-21 DIAGNOSIS — I619 Nontraumatic intracerebral hemorrhage, unspecified: Secondary | ICD-10-CM | POA: Diagnosis not present

## 2022-02-21 DIAGNOSIS — E039 Hypothyroidism, unspecified: Secondary | ICD-10-CM | POA: Diagnosis present

## 2022-02-21 DIAGNOSIS — Z4682 Encounter for fitting and adjustment of non-vascular catheter: Secondary | ICD-10-CM | POA: Diagnosis not present

## 2022-02-21 DIAGNOSIS — I110 Hypertensive heart disease with heart failure: Secondary | ICD-10-CM | POA: Diagnosis not present

## 2022-02-21 DIAGNOSIS — W19XXXA Unspecified fall, initial encounter: Secondary | ICD-10-CM | POA: Diagnosis not present

## 2022-02-21 DIAGNOSIS — I480 Paroxysmal atrial fibrillation: Secondary | ICD-10-CM | POA: Diagnosis present

## 2022-02-21 DIAGNOSIS — R402252 Coma scale, best verbal response, oriented, at arrival to emergency department: Secondary | ICD-10-CM | POA: Diagnosis present

## 2022-02-21 DIAGNOSIS — G935 Compression of brain: Secondary | ICD-10-CM | POA: Diagnosis present

## 2022-02-21 DIAGNOSIS — S0281XA Fracture of other specified skull and facial bones, right side, initial encounter for closed fracture: Secondary | ICD-10-CM | POA: Diagnosis not present

## 2022-02-21 DIAGNOSIS — I6200 Nontraumatic subdural hemorrhage, unspecified: Secondary | ICD-10-CM | POA: Diagnosis not present

## 2022-02-21 DIAGNOSIS — Z7901 Long term (current) use of anticoagulants: Secondary | ICD-10-CM

## 2022-02-21 DIAGNOSIS — Z66 Do not resuscitate: Secondary | ICD-10-CM | POA: Diagnosis not present

## 2022-02-21 DIAGNOSIS — R402362 Coma scale, best motor response, obeys commands, at arrival to emergency department: Secondary | ICD-10-CM | POA: Diagnosis present

## 2022-02-21 DIAGNOSIS — I4510 Unspecified right bundle-branch block: Secondary | ICD-10-CM | POA: Diagnosis present

## 2022-02-21 DIAGNOSIS — I350 Nonrheumatic aortic (valve) stenosis: Secondary | ICD-10-CM | POA: Diagnosis not present

## 2022-02-21 DIAGNOSIS — S060XAA Concussion with loss of consciousness status unknown, initial encounter: Secondary | ICD-10-CM | POA: Diagnosis not present

## 2022-02-21 DIAGNOSIS — Z950 Presence of cardiac pacemaker: Secondary | ICD-10-CM | POA: Diagnosis not present

## 2022-02-21 DIAGNOSIS — E119 Type 2 diabetes mellitus without complications: Secondary | ICD-10-CM

## 2022-02-21 DIAGNOSIS — Z79890 Hormone replacement therapy (postmenopausal): Secondary | ICD-10-CM

## 2022-02-21 DIAGNOSIS — S065X0A Traumatic subdural hemorrhage without loss of consciousness, initial encounter: Secondary | ICD-10-CM | POA: Diagnosis not present

## 2022-02-21 DIAGNOSIS — I495 Sick sinus syndrome: Secondary | ICD-10-CM | POA: Diagnosis present

## 2022-02-21 DIAGNOSIS — S065XAA Traumatic subdural hemorrhage with loss of consciousness status unknown, initial encounter: Secondary | ICD-10-CM | POA: Diagnosis present

## 2022-02-21 DIAGNOSIS — S0219XA Other fracture of base of skull, initial encounter for closed fracture: Secondary | ICD-10-CM | POA: Diagnosis present

## 2022-02-21 DIAGNOSIS — G9349 Other encephalopathy: Secondary | ICD-10-CM | POA: Diagnosis present

## 2022-02-21 DIAGNOSIS — J9690 Respiratory failure, unspecified, unspecified whether with hypoxia or hypercapnia: Secondary | ICD-10-CM | POA: Diagnosis not present

## 2022-02-21 DIAGNOSIS — Z7984 Long term (current) use of oral hypoglycemic drugs: Secondary | ICD-10-CM

## 2022-02-21 DIAGNOSIS — S02119A Unspecified fracture of occiput, initial encounter for closed fracture: Secondary | ICD-10-CM | POA: Diagnosis present

## 2022-02-21 DIAGNOSIS — W010XXA Fall on same level from slipping, tripping and stumbling without subsequent striking against object, initial encounter: Principal | ICD-10-CM

## 2022-02-21 DIAGNOSIS — J9601 Acute respiratory failure with hypoxia: Secondary | ICD-10-CM | POA: Diagnosis not present

## 2022-02-21 DIAGNOSIS — Z888 Allergy status to other drugs, medicaments and biological substances status: Secondary | ICD-10-CM

## 2022-02-21 DIAGNOSIS — Z85828 Personal history of other malignant neoplasm of skin: Secondary | ICD-10-CM

## 2022-02-21 DIAGNOSIS — E785 Hyperlipidemia, unspecified: Secondary | ICD-10-CM | POA: Diagnosis present

## 2022-02-21 DIAGNOSIS — S061X9A Traumatic cerebral edema with loss of consciousness of unspecified duration, initial encounter: Secondary | ICD-10-CM | POA: Diagnosis not present

## 2022-02-21 DIAGNOSIS — R42 Dizziness and giddiness: Secondary | ICD-10-CM | POA: Diagnosis not present

## 2022-02-21 DIAGNOSIS — I609 Nontraumatic subarachnoid hemorrhage, unspecified: Secondary | ICD-10-CM | POA: Diagnosis not present

## 2022-02-21 DIAGNOSIS — Z79899 Other long term (current) drug therapy: Secondary | ICD-10-CM

## 2022-02-21 DIAGNOSIS — M47812 Spondylosis without myelopathy or radiculopathy, cervical region: Secondary | ICD-10-CM | POA: Diagnosis not present

## 2022-02-21 DIAGNOSIS — Z515 Encounter for palliative care: Secondary | ICD-10-CM | POA: Diagnosis not present

## 2022-02-21 DIAGNOSIS — S066X0A Traumatic subarachnoid hemorrhage without loss of consciousness, initial encounter: Secondary | ICD-10-CM | POA: Diagnosis not present

## 2022-02-21 DIAGNOSIS — S199XXA Unspecified injury of neck, initial encounter: Secondary | ICD-10-CM | POA: Diagnosis not present

## 2022-02-21 DIAGNOSIS — H5704 Mydriasis: Secondary | ICD-10-CM | POA: Diagnosis present

## 2022-02-21 DIAGNOSIS — S06360A Traumatic hemorrhage of cerebrum, unspecified, without loss of consciousness, initial encounter: Secondary | ICD-10-CM | POA: Diagnosis not present

## 2022-02-21 HISTORY — PX: CRANIOTOMY: SHX93

## 2022-02-21 LAB — POCT I-STAT 7, (LYTES, BLD GAS, ICA,H+H)
Acid-base deficit: 5 mmol/L — ABNORMAL HIGH (ref 0.0–2.0)
Bicarbonate: 19.5 mmol/L — ABNORMAL LOW (ref 20.0–28.0)
Calcium, Ion: 1.09 mmol/L — ABNORMAL LOW (ref 1.15–1.40)
HCT: 33 % — ABNORMAL LOW (ref 39.0–52.0)
Hemoglobin: 11.2 g/dL — ABNORMAL LOW (ref 13.0–17.0)
O2 Saturation: 100 %
Patient temperature: 98.3
Potassium: 3.6 mmol/L (ref 3.5–5.1)
Sodium: 140 mmol/L (ref 135–145)
TCO2: 21 mmol/L — ABNORMAL LOW (ref 22–32)
pCO2 arterial: 34 mmHg (ref 32–48)
pH, Arterial: 7.366 (ref 7.35–7.45)
pO2, Arterial: 443 mmHg — ABNORMAL HIGH (ref 83–108)

## 2022-02-21 LAB — CBC
HCT: 44.4 % (ref 39.0–52.0)
Hemoglobin: 15.1 g/dL (ref 13.0–17.0)
MCH: 30.2 pg (ref 26.0–34.0)
MCHC: 34 g/dL (ref 30.0–36.0)
MCV: 88.8 fL (ref 80.0–100.0)
Platelets: 189 10*3/uL (ref 150–400)
RBC: 5 MIL/uL (ref 4.22–5.81)
RDW: 14.3 % (ref 11.5–15.5)
WBC: 9.7 10*3/uL (ref 4.0–10.5)
nRBC: 0 % (ref 0.0–0.2)

## 2022-02-21 LAB — APTT: aPTT: 31 seconds (ref 24–36)

## 2022-02-21 LAB — URINALYSIS, ROUTINE W REFLEX MICROSCOPIC
Bilirubin Urine: NEGATIVE
Glucose, UA: 50 mg/dL — AB
Hgb urine dipstick: NEGATIVE
Ketones, ur: NEGATIVE mg/dL
Leukocytes,Ua: NEGATIVE
Nitrite: NEGATIVE
Protein, ur: NEGATIVE mg/dL
Specific Gravity, Urine: 1.008 (ref 1.005–1.030)
pH: 5 (ref 5.0–8.0)

## 2022-02-21 LAB — COMPREHENSIVE METABOLIC PANEL
ALT: 23 U/L (ref 0–44)
AST: 32 U/L (ref 15–41)
Albumin: 3.9 g/dL (ref 3.5–5.0)
Alkaline Phosphatase: 53 U/L (ref 38–126)
Anion gap: 13 (ref 5–15)
BUN: 20 mg/dL (ref 8–23)
CO2: 20 mmol/L — ABNORMAL LOW (ref 22–32)
Calcium: 9.6 mg/dL (ref 8.9–10.3)
Chloride: 104 mmol/L (ref 98–111)
Creatinine, Ser: 1.07 mg/dL (ref 0.61–1.24)
GFR, Estimated: >60 mL/min (ref >60)
Glucose, Bld: 262 mg/dL — ABNORMAL HIGH (ref 70–99)
Potassium: 3.6 mmol/L (ref 3.5–5.1)
Sodium: 137 mmol/L (ref 135–145)
Total Bilirubin: 1.1 mg/dL (ref 0.3–1.2)
Total Protein: 6.8 g/dL (ref 6.5–8.1)

## 2022-02-21 LAB — I-STAT ARTERIAL BLOOD GAS, ED
Acid-base deficit: 5 mmol/L — ABNORMAL HIGH (ref 0.0–2.0)
Bicarbonate: 19.5 mmol/L — ABNORMAL LOW (ref 20.0–28.0)
Calcium, Ion: 1.15 mmol/L (ref 1.15–1.40)
HCT: 34 % — ABNORMAL LOW (ref 39.0–52.0)
Hemoglobin: 11.6 g/dL — ABNORMAL LOW (ref 13.0–17.0)
O2 Saturation: 100 %
Patient temperature: 97.4
Potassium: 3.2 mmol/L — ABNORMAL LOW (ref 3.5–5.1)
Sodium: 135 mmol/L (ref 135–145)
TCO2: 21 mmol/L — ABNORMAL LOW (ref 22–32)
pCO2 arterial: 34 mmHg (ref 32–48)
pH, Arterial: 7.363 (ref 7.35–7.45)
pO2, Arterial: 495 mmHg — ABNORMAL HIGH (ref 83–108)

## 2022-02-21 LAB — PROTIME-INR
INR: 1.4 — ABNORMAL HIGH (ref 0.8–1.2)
Prothrombin Time: 17.4 seconds — ABNORMAL HIGH (ref 11.4–15.2)

## 2022-02-21 LAB — MRSA NEXT GEN BY PCR, NASAL: MRSA by PCR Next Gen: NOT DETECTED

## 2022-02-21 LAB — PREPARE RBC (CROSSMATCH)

## 2022-02-21 LAB — ABO/RH: ABO/RH(D): A POS

## 2022-02-21 LAB — GLUCOSE, CAPILLARY: Glucose-Capillary: 199 mg/dL — ABNORMAL HIGH (ref 70–99)

## 2022-02-21 SURGERY — CRANIOTOMY HEMATOMA EVACUATION SUBDURAL
Anesthesia: General | Site: Head | Laterality: Left

## 2022-02-21 MED ORDER — SIMVASTATIN 20 MG PO TABS
10.0000 mg | ORAL_TABLET | Freq: Every day | ORAL | Status: DC
Start: 2022-02-22 — End: 2022-02-24
  Administered 2022-02-22 – 2022-02-23 (×2): 10 mg
  Filled 2022-02-21 (×2): qty 1

## 2022-02-21 MED ORDER — POLYETHYLENE GLYCOL 3350 17 G PO PACK
17.0000 g | PACK | Freq: Every day | ORAL | Status: DC
  Administered 2022-02-21 – 2022-02-23 (×3): 17 g
  Filled 2022-02-21 (×3): qty 1

## 2022-02-21 MED ORDER — DOCUSATE SODIUM 100 MG PO CAPS: 100.0000 mg | ORAL_CAPSULE | Freq: Two times a day (BID) | ORAL | Status: DC

## 2022-02-21 MED ORDER — ONDANSETRON HCL 4 MG PO TABS
4.0000 mg | ORAL_TABLET | ORAL | Status: DC | PRN
Start: 2022-02-21 — End: 2022-02-23

## 2022-02-21 MED ORDER — SODIUM CHLORIDE 0.9 % IV SOLN: INTRAVENOUS | Status: DC | PRN

## 2022-02-21 MED ORDER — CLEVIDIPINE BUTYRATE 0.5 MG/ML IV EMUL
0.0000 mg/h | INTRAVENOUS | Status: DC
  Administered 2022-02-21: 2 mg/h via INTRAVENOUS

## 2022-02-21 MED ORDER — PROPOFOL 1000 MG/100ML IV EMUL: 5.0000 ug/kg/min | INTRAVENOUS | Status: DC

## 2022-02-21 MED ORDER — AMIODARONE HCL 200 MG PO TABS: 200.0000 mg | ORAL_TABLET | Freq: Two times a day (BID) | ORAL | Status: DC

## 2022-02-21 MED ORDER — LEVOTHYROXINE SODIUM 25 MCG PO TABS
25.0000 ug | ORAL_TABLET | Freq: Every day | ORAL | Status: DC
Start: 2022-02-22 — End: 2022-02-24
  Administered 2022-02-22 – 2022-02-23 (×2): 25 ug
  Filled 2022-02-21 (×2): qty 1

## 2022-02-21 MED ORDER — EMPTY CONTAINERS FLEXIBLE MISC
1800.0000 mg | Freq: Once | Status: DC
  Filled 2022-02-21: qty 180

## 2022-02-21 MED ORDER — LIDOCAINE-EPINEPHRINE 1 %-1:100000 IJ SOLN
INTRAMUSCULAR | Status: DC | PRN
  Administered 2022-02-21: 10 mL

## 2022-02-21 MED ORDER — DOCUSATE SODIUM 50 MG/5ML PO LIQD
100.0000 mg | Freq: Two times a day (BID) | ORAL | Status: DC
  Administered 2022-02-21 – 2022-02-23 (×4): 100 mg
  Filled 2022-02-21 (×4): qty 10

## 2022-02-21 MED ORDER — THROMBIN 5000 UNITS EX SOLR
CUTANEOUS | Status: AC
  Filled 2022-02-21: qty 5000

## 2022-02-21 MED ORDER — EPHEDRINE SULFATE (PRESSORS) 50 MG/ML IJ SOLN
INTRAMUSCULAR | Status: DC | PRN
  Administered 2022-02-21 (×2): 10 mg via INTRAVENOUS

## 2022-02-21 MED ORDER — SODIUM CHLORIDE 0.9 % IV BOLUS
500.0000 mL | Freq: Once | INTRAVENOUS | Status: AC
  Administered 2022-02-21: 500 mL via INTRAVENOUS

## 2022-02-21 MED ORDER — ACETAMINOPHEN 500 MG PO TABS
1000.0000 mg | ORAL_TABLET | Freq: Once | ORAL | Status: AC
  Administered 2022-02-21: 1000 mg via ORAL
  Filled 2022-02-21: qty 2

## 2022-02-21 MED ORDER — PHENYLEPHRINE HCL-NACL 20-0.9 MG/250ML-% IV SOLN
INTRAVENOUS | Status: DC | PRN
  Administered 2022-02-21: 50 ug/min via INTRAVENOUS

## 2022-02-21 MED ORDER — FENTANYL CITRATE PF 50 MCG/ML IJ SOSY: 25.0000 ug | PREFILLED_SYRINGE | INTRAMUSCULAR | Status: DC | PRN

## 2022-02-21 MED ORDER — DILTIAZEM HCL ER COATED BEADS 180 MG PO CP24: 180.0000 mg | ORAL_CAPSULE | Freq: Every day | ORAL | Status: DC

## 2022-02-21 MED ORDER — MANNITOL 25 % IV SOLN: 70.0000 g | Freq: Once | INTRAVENOUS | Status: DC

## 2022-02-21 MED ORDER — SIMVASTATIN 20 MG PO TABS: 10.0000 mg | ORAL_TABLET | Freq: Every day | ORAL | Status: DC

## 2022-02-21 MED ORDER — BACITRACIN ZINC 500 UNIT/GM EX OINT
TOPICAL_OINTMENT | CUTANEOUS | Status: AC
  Filled 2022-02-21: qty 28.35

## 2022-02-21 MED ORDER — FENTANYL 2500MCG IN NS 250ML (10MCG/ML) PREMIX INFUSION: 0.0000 ug/h | INTRAVENOUS | Status: DC

## 2022-02-21 MED ORDER — POLYETHYLENE GLYCOL 3350 17 G PO PACK: 17.0000 g | PACK | Freq: Every day | ORAL | Status: DC | PRN

## 2022-02-21 MED ORDER — FUROSEMIDE 40 MG PO TABS: 40.0000 mg | ORAL_TABLET | Freq: Every day | ORAL | Status: DC

## 2022-02-21 MED ORDER — PANTOPRAZOLE 2 MG/ML SUSPENSION
40.0000 mg | Freq: Every day | ORAL | Status: DC
Start: 2022-02-22 — End: 2022-02-24
  Administered 2022-02-22 – 2022-02-23 (×2): 40 mg
  Filled 2022-02-21 (×2): qty 20

## 2022-02-21 MED ORDER — PHENYLEPHRINE HCL (PRESSORS) 10 MG/ML IV SOLN
INTRAVENOUS | Status: DC | PRN
  Administered 2022-02-21 (×2): 160 ug via INTRAVENOUS

## 2022-02-21 MED ORDER — EMPTY CONTAINERS FLEXIBLE MISC
900.0000 mg | Freq: Once | Status: AC
  Administered 2022-02-21: 900 mg via INTRAVENOUS
  Filled 2022-02-21: qty 90

## 2022-02-21 MED ORDER — CEFAZOLIN SODIUM 1 G IJ SOLR
INTRAMUSCULAR | Status: AC
  Filled 2022-02-21: qty 40

## 2022-02-21 MED ORDER — FENTANYL CITRATE (PF) 250 MCG/5ML IJ SOLN
INTRAMUSCULAR | Status: DC | PRN
  Administered 2022-02-21: 50 ug via INTRAVENOUS

## 2022-02-21 MED ORDER — FENTANYL CITRATE PF 50 MCG/ML IJ SOSY
25.0000 ug | PREFILLED_SYRINGE | INTRAMUSCULAR | Status: DC | PRN
  Administered 2022-02-22 – 2022-02-23 (×5): 50 ug via INTRAVENOUS
  Filled 2022-02-21 (×5): qty 1

## 2022-02-21 MED ORDER — ETOMIDATE 2 MG/ML IV SOLN
INTRAVENOUS | Status: AC | PRN
  Administered 2022-02-21: 20 mg via INTRAVENOUS

## 2022-02-21 MED ORDER — THROMBIN 20000 UNITS EX SOLR
CUTANEOUS | Status: AC
  Filled 2022-02-21: qty 20000

## 2022-02-21 MED ORDER — INSULIN ASPART 100 UNIT/ML IJ SOLN
0.0000 [IU] | INTRAMUSCULAR | Status: DC
  Administered 2022-02-21: 3 [IU] via SUBCUTANEOUS
  Administered 2022-02-22: 2 [IU] via SUBCUTANEOUS
  Administered 2022-02-22: 3 [IU] via SUBCUTANEOUS
  Administered 2022-02-22: 5 [IU] via SUBCUTANEOUS
  Administered 2022-02-22: 3 [IU] via SUBCUTANEOUS
  Administered 2022-02-22 – 2022-02-23 (×2): 5 [IU] via SUBCUTANEOUS
  Administered 2022-02-23: 8 [IU] via SUBCUTANEOUS
  Administered 2022-02-23: 5 [IU] via SUBCUTANEOUS

## 2022-02-21 MED ORDER — LEVOTHYROXINE SODIUM 25 MCG PO TABS: 25.0000 ug | ORAL_TABLET | Freq: Every day | ORAL | Status: DC

## 2022-02-21 MED ORDER — PROPOFOL 1000 MG/100ML IV EMUL
5.0000 ug/kg/min | INTRAVENOUS | Status: DC
  Administered 2022-02-21: 30 ug/kg/min via INTRAVENOUS
  Administered 2022-02-21 – 2022-02-22 (×2): 10 ug/kg/min via INTRAVENOUS
  Filled 2022-02-21 (×2): qty 100

## 2022-02-21 MED ORDER — THROMBIN 20000 UNITS EX SOLR: CUTANEOUS | Status: DC | PRN

## 2022-02-21 MED ORDER — FENTANYL CITRATE (PF) 250 MCG/5ML IJ SOLN
INTRAMUSCULAR | Status: AC
  Filled 2022-02-21: qty 5

## 2022-02-21 MED ORDER — PROMETHAZINE HCL 25 MG PO TABS: 12.5000 mg | ORAL_TABLET | ORAL | Status: DC | PRN

## 2022-02-21 MED ORDER — ALBUMIN HUMAN 5 % IV SOLN: INTRAVENOUS | Status: DC | PRN

## 2022-02-21 MED ORDER — ONDANSETRON HCL 4 MG/2ML IJ SOLN: 4.0000 mg | INTRAMUSCULAR | Status: DC | PRN

## 2022-02-21 MED ORDER — LIDOCAINE-EPINEPHRINE 1 %-1:100000 IJ SOLN
INTRAMUSCULAR | Status: AC
  Filled 2022-02-21: qty 1

## 2022-02-21 MED ORDER — SODIUM CHLORIDE 0.9 % IV SOLN
INTRAVENOUS | Status: AC | PRN
  Administered 2022-02-21: 1000 mL via INTRAVENOUS

## 2022-02-21 MED ORDER — 0.9 % SODIUM CHLORIDE (POUR BTL) OPTIME
TOPICAL | Status: DC | PRN
  Administered 2022-02-21: 3000 mL

## 2022-02-21 MED ORDER — ROCURONIUM BROMIDE 10 MG/ML (PF) SYRINGE
PREFILLED_SYRINGE | INTRAVENOUS | Status: DC | PRN
  Administered 2022-02-21: 50 mg via INTRAVENOUS

## 2022-02-21 MED ORDER — MANNITOL 20 % IV SOLN
70.0000 g | Freq: Once | Status: AC
  Administered 2022-02-21: 70 g via INTRAVENOUS
  Filled 2022-02-21: qty 350

## 2022-02-21 MED ORDER — BACITRACIN ZINC 500 UNIT/GM EX OINT
TOPICAL_OINTMENT | CUTANEOUS | Status: DC | PRN
  Administered 2022-02-21: 1 via TOPICAL

## 2022-02-21 MED ORDER — SUCCINYLCHOLINE CHLORIDE 20 MG/ML IJ SOLN
INTRAMUSCULAR | Status: AC | PRN
  Administered 2022-02-21: 120 mg via INTRAVENOUS

## 2022-02-21 MED ORDER — LABETALOL HCL 5 MG/ML IV SOLN: 10.0000 mg | INTRAVENOUS | Status: DC | PRN

## 2022-02-21 MED ORDER — CEFAZOLIN SODIUM-DEXTROSE 2-3 GM-%(50ML) IV SOLR
INTRAVENOUS | Status: DC | PRN
  Administered 2022-02-21: 2 g via INTRAVENOUS

## 2022-02-21 MED ORDER — AMIODARONE HCL 200 MG PO TABS
200.0000 mg | ORAL_TABLET | Freq: Two times a day (BID) | ORAL | Status: DC
Start: 2022-02-21 — End: 2022-02-24
  Administered 2022-02-21 – 2022-02-23 (×4): 200 mg
  Filled 2022-02-21 (×4): qty 1

## 2022-02-21 MED ORDER — THROMBIN 5000 UNITS EX SOLR: OROMUCOSAL | Status: DC | PRN

## 2022-02-21 MED ORDER — ACETAMINOPHEN 500 MG PO TABS
1000.0000 mg | ORAL_TABLET | Freq: Every day | ORAL | Status: DC | PRN
Start: 2022-02-21 — End: 2022-02-22
  Filled 2022-02-21: qty 2

## 2022-02-21 SURGICAL SUPPLY — 47 items
BAG COUNTER SPONGE SURGICOUNT (BAG) ×3 IMPLANT
BLADE CLIPPER SURG (BLADE) ×2 IMPLANT
BUR ACORN 9.0 PRECISION (BURR) ×2 IMPLANT
BUR MATCHSTICK NEURO 3.0 LAGG (BURR) IMPLANT
BUR SPIRAL ROUTER 2.3 (BUR) ×2 IMPLANT
CANISTER SUCT 3000ML PPV (MISCELLANEOUS) ×3 IMPLANT
CATH VENTRIC 35X38 W/TROCAR LG (CATHETERS) ×1 IMPLANT
COVER BURR HOLE UNIV 10 (Orthopedic Implant) ×2 IMPLANT
DRAPE NEUROLOGICAL W/INCISE (DRAPES) ×2 IMPLANT
DRAPE SHEET LG 3/4 BI-LAMINATE (DRAPES) ×2 IMPLANT
DRAPE WARM FLUID 44X44 (DRAPES) ×2 IMPLANT
DURAPREP 6ML APPLICATOR 50/CS (WOUND CARE) ×2 IMPLANT
ELECT REM PT RETURN 9FT ADLT (ELECTROSURGICAL) ×2
ELECTRODE REM PT RTRN 9FT ADLT (ELECTROSURGICAL) ×1 IMPLANT
GLOVE BIOGEL PI IND STRL 7.5 (GLOVE) ×2 IMPLANT
GLOVE BIOGEL PI INDICATOR 7.5 (GLOVE) ×2
GLOVE ECLIPSE 7.5 STRL STRAW (GLOVE) ×4 IMPLANT
GOWN STRL REUS W/ TWL LRG LVL3 (GOWN DISPOSABLE) ×2 IMPLANT
GOWN STRL REUS W/ TWL XL LVL3 (GOWN DISPOSABLE) IMPLANT
GOWN STRL REUS W/TWL LRG LVL3 (GOWN DISPOSABLE) ×3
GOWN STRL REUS W/TWL XL LVL3 (GOWN DISPOSABLE) ×1
HEMOSTAT POWDER KIT SURGIFOAM (HEMOSTASIS) ×3 IMPLANT
HEMOSTAT SURGICEL 2X14 (HEMOSTASIS) IMPLANT
KIT BASIN OR (CUSTOM PROCEDURE TRAY) ×2 IMPLANT
KIT TURNOVER KIT B (KITS) ×2 IMPLANT
NEEDLE HYPO 22GX1.5 SAFETY (NEEDLE) ×2 IMPLANT
NS IRRIG 1000ML POUR BTL (IV SOLUTION) ×4 IMPLANT
PACK CRANIOTOMY CUSTOM (CUSTOM PROCEDURE TRAY) ×2 IMPLANT
PLATE CRANIAL SHUNT 14 (Plate) ×1 IMPLANT
SCREW UNIII AXS SD 1.5X4 (Screw) ×14 IMPLANT
SPONGE SURGIFOAM ABS GEL 100 (HEMOSTASIS) ×2 IMPLANT
STAPLER VISISTAT 35W (STAPLE) ×2 IMPLANT
STOCKINETTE 6  STRL (DRAPES) ×1
STOCKINETTE 6 STRL (DRAPES) ×1 IMPLANT
SUT MON AB 3-0 SH 27 (SUTURE) ×1
SUT MON AB 3-0 SH27 (SUTURE) IMPLANT
SUT NURALON 4 0 TR CR/8 (SUTURE) ×5 IMPLANT
SUT VIC AB 0 CT1 18XCR BRD8 (SUTURE) ×2 IMPLANT
SUT VIC AB 0 CT1 8-18 (SUTURE) ×3
SUT VIC AB 3-0 SH 8-18 (SUTURE) ×4 IMPLANT
SYSTEM CSF EXTERNAL DRAINAGE (MISCELLANEOUS) ×1 IMPLANT
TOWEL GREEN STERILE (TOWEL DISPOSABLE) ×2 IMPLANT
TOWEL GREEN STERILE FF (TOWEL DISPOSABLE) ×2 IMPLANT
TRAY FOLEY MTR SLVR 16FR STAT (SET/KITS/TRAYS/PACK) ×2 IMPLANT
TUBE CONNECTING 12X1/4 (SUCTIONS) ×2 IMPLANT
UNDERPAD 30X36 HEAVY ABSORB (UNDERPADS AND DIAPERS) ×2 IMPLANT
WATER STERILE IRR 1000ML POUR (IV SOLUTION) ×2 IMPLANT

## 2022-02-21 NOTE — Consult Note (Signed)
Reason for Consult: Fall  Referring Physician: Zymir Butler is an 81 y.o. male.  HPI:  Patient is an 81 year old male who was brought to the emergency department after sustaining a fall at home.  He was at home with his daughter.  She states that he will sometimes walk around the neighborhood for short walks.  He sometimes will try to mow, but is only supposed to do so when her husband is home.  She states that she checked on him multiple times and he was not trying to go to the shed outside, but in between 2 of her checks she noted that the door to the shed was open.  She immediately went to check on him and he was lying on the ground.  He was alert and did not recall falling, but thinks that he was getting dizzy.  He does think he passed out.  He was able to talk and converse at that point.  He is on Eliquis.  In the emergency department, he had declining mental status and head CT showing significant head injury.  He was intubated for airway protection.  He was evaluated by neurosurgery and was scheduled for operative intervention.  His daughter reports that after he fell he was also complaining of back pain.  ER physician states that upon his initial exam when the patient was alert, the patient denied tenderness on the back and did not have any evidence of bruising.  Past Medical History:  Diagnosis Date   Aneurysm ([place])    Atrial fibrillation ([place])    CAP (community acquired pneumonia) 10/20/2021   Cardiac tamponade    Diabetes mellitus without complication ([place])    Hypertension    Non-recurrent acute suppurative otitis media of right ear without spontaneous rupture of tympanic membrane 07/04/2021   Stroke [facility])     Past Surgical History:  Procedure Laterality Date   LEAD REVISION/REPAIR N/A 01/16/2022   Procedure: LEAD REVISION/REPAIR;  Surgeon: Evans Lance, MD;  Location: [facility];  Service: Cardiovascular;  Laterality: N/A;   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 No pertinent family history.  Social History:  reports that he has never smoked. He has never used smokeless tobacco. He reports that he does not currently use alcohol. He reports that he does not currently use drugs.  Allergies:  Allergies  Allergen Reactions   Metoprolol Other (See Comments)    Pt took two different times and both made him feel bad, the second time hypotensive. He prefers not to take    Medications:  acetaminophen (TYLENOL) 500 MG tablet  amiodarone (PACERONE) 200 MG tablet  apixaban (ELIQUIS) 5 MG TABS tablet  Calcium Carbonate-Vitamin D (CALCIUM 600+D PO) cholecalciferol (VITAMIN D3) 25 MCG (1000 UNIT) tablet  diltiazem (CARDIZEM CD) 180 MG 24 hr capsule doxycycline (VIBRA-TABS) 100 MG tablet  furosemide (LASIX) 40 MG tablet  levothyroxine (SYNTHROID) 25 MCG tablet magnesium oxide (MAG-OX) 400 MG tablet  metFORMIN (GLUCOPHAGE-XR) 500 MG 24 hr tablet  simvastatin (ZOCOR) 10 MG tablet Accu-Chek FastClix Lancets MISC Blood Glucose Monitoring Suppl (ACCU-CHEK GUIDE ME) w/Device KIT glucose blood (ACCU-CHEK GUIDE) test strip  Results for orders placed or performed during the hospital encounter of 02/27/2022 (from the past 48 hour(s))  Urinalysis, Routine w reflex microscopic Urine, Clean Catch     Status: Abnormal   Collection Time: 02/18/2022 11:45 AM  Result Value Ref Range   Color, Urine STRAW (A) YELLOW   APPearance CLEAR CLEAR   Specific Gravity, Urine 1.008 1.005 - 1.030   pH 5.0 5.0 - 8.0   Glucose, UA 50 (A) NEGATIVE mg/dL   Hgb urine dipstick NEGATIVE NEGATIVE   Bilirubin Urine  NEGATIVE NEGATIVE   Ketones, ur NEGATIVE NEGATIVE mg/dL   Protein, ur NEGATIVE NEGATIVE mg/dL   Nitrite NEGATIVE NEGATIVE   Leukocytes,Ua NEGATIVE NEGATIVE    Comment: Performed at [facility] [address] [place] [address]  CBC     Status: None   Collection Time: 02/28/2022 11:46 AM  Result Value Ref Range   WBC 9.7 4.0 - 10.5 K/uL   RBC 5.00 4.22 - 5.81 MIL/uL   Hemoglobin 15.1 13.0 - 17.0 g/dL   HCT 44.4 39.0 - 52.0 %   MCV 88.8 80.0 - 100.0 fL   MCH 30.2 26.0 - 34.0 pg   MCHC 34.0 30.0 - 36.0 g/dL   RDW 14.3 11.5 - 15.5 %   Platelets 189 150 - 400 K/uL   nRBC 0.0 0.0 - 0.2 %    Comment: Performed at [facility], [place] [address], [place], [place] [address]  Comprehensive metabolic panel     Status: Abnormal   Collection Time: 02/20/2022 11:46 AM  Result Value Ref Range   Sodium 137 135 - 145 mmol/L   Potassium 3.6 3.5 - 5.1 mmol/L   Chloride 104 98 - 111 mmol/L   CO2 20 (L) 22 - 32 mmol/L   Glucose, Bld 262 (H) 70 - 99 mg/dL    Comment: Glucose reference range applies only to samples taken after fasting for at least 8 hours.   BUN 20 8 - 23 mg/dL   Creatinine, Ser 1.07 0.61 - 1.24 mg/dL   Calcium 9.6 8.9 - 10.3 mg/dL   Total Protein 6.8 6.5 - 8.1 g/dL   Albumin 3.9 3.5 - 5.0 g/dL   AST 32 15 - 41 U/L   ALT 23 0 - 44 U/L   Alkaline Phosphatase 53 38 - 126 U/L   Total Bilirubin 1.1 0.3 - 1.2 mg/dL   GFR, Estimated >60 >60 mL/min    Comment: (NOTE) Calculated using the CKD-EPI Creatinine Equation (2021)    Anion gap 13 5 - 15    Comment: Performed at [facility] [address], [place], [place] [address]  ABO/Rh     Status: None   Collection Time: 02/26/2022 11:48 AM  Result Value Ref Range   ABO/RH(D)      A POS Performed at [facility] [address] [place], [place] [address]   Protime-INR     Status: Abnormal   Collection Time: 02/07/2022  3:05 PM  Result Value Ref Range   Prothrombin Time 17.4 (H) 11.4 - 15.2 seconds   INR  1.4 (H) 0.8 - 1.2    Comment: (NOTE) INR goal varies based on device and disease states. Performed at [facility], [place] [address] [place], [place] [address]   APTT     Status: None   Collection Time: 02/20/2022  3:05 PM  Result Value Ref Range   aPTT 31 24 - 36 seconds  Comment: Performed at [facility], [place] [address], [place], [place] [address]  Type and screen [place]     Status: None (Preliminary result)   Collection Time: 02/06/2022  3:05 PM  Result Value Ref Range   ABO/RH(D) A POS    Antibody Screen NEG    Sample Expiration 02/24/2022,2359    Unit Number [id number]    Blood Component Type RED CELLS,LR    Unit division 00    Status of Unit ISSUED    Transfusion Status OK TO TRANSFUSE    Crossmatch Result      Compatible Performed at [facility], [place] [address] [place], [place] [address]   I-Stat arterial blood gas, ED     Status: Abnormal   Collection Time: 02/11/2022  5:16 PM  Result Value Ref Range   pH, Arterial 7.363 7.35 - 7.45   pCO2 arterial 34.0 32 - 48 mmHg   pO2, Arterial 495 (H) 83 - 108 mmHg   Bicarbonate 19.5 (L) 20.0 - 28.0 mmol/L   TCO2 21 (L) 22 - 32 mmol/L   O2 Saturation 100 %   Acid-base deficit 5.0 (H) 0.0 - 2.0 mmol/L   Sodium 135 135 - 145 mmol/L   Potassium 3.2 (L) 3.5 - 5.1 mmol/L   Calcium, Ion 1.15 1.15 - 1.40 mmol/L   HCT 34.0 (L) 39.0 - 52.0 %   Hemoglobin 11.6 (L) 13.0 - 17.0 g/dL   Patient temperature 97.4 F    Collection site RADIAL, ALLEN'S TEST ACCEPTABLE    Drawn by RT    Sample type ARTERIAL   Prepare RBC (crossmatch)     Status: None   Collection Time: 02/11/2022  8:06 PM  Result Value Ref Range   Order Confirmation      ORDER PROCESSED BY BLOOD BANK Performed at [facility], [address], [place], [place] [address]     DG Chest Port 1 View  Result Date: 02/12/2022 CLINICAL DATA:  intubated EXAM: PORTABLE CHEST 1 VIEW COMPARISON:  X-ray chest 02/01/2022 FINDINGS:  Endotracheal tube with tip terminating 3 cm above the carina. Enteric tube coursing below the hemidiaphragm with side port overlying the expected region of the gastric lumen and tip collimated off view. Left chest wall dual lead cardiac pacemaker in similar position. The heart and mediastinal contours are unchanged. Aortic calcification. No focal consolidation. No pulmonary edema. No pleural effusion. No pneumothorax. No acute osseous abnormality. IMPRESSION: 1. No active disease. 2.  Aortic Atherosclerosis (ICD10-I70.0). 3. Endotracheal tube in good position. 4. Enteric tube with side port overlying the expected region the gastric lumen. Consider retracting by 4 cm. Electronically Signed   By: Iven Finn M.D.   On: 02/02/2022 16:25   CT Head Wo Contrast  Result Date: 02/26/2022 CLINICAL DATA:  Head trauma, minor. EXAM: CT HEAD WITHOUT CONTRAST TECHNIQUE: Contiguous axial images were obtained from the base of the skull through the vertex without intravenous contrast. RADIATION DOSE REDUCTION: This exam was performed according to the departmental dose-optimization program which includes automated exposure control, adjustment of the mA and/or kV according to patient size and/or use of iterative reconstruction technique. COMPARISON:  Prior head CT examinations 01/30/2022 and earlier. FINDINGS: Brain: Acute subdural hematoma overlying the left cerebral hemisphere measuring up to 16 mm in thickness. Associated mass effect with partial effacement of the left lateral ventricle and 14 mm rightward midline shift measured at the level of the septum pellucidum. No definite uncal herniation is  appreciated at this time. Acute subdural hematoma overlying the right cerebral hemisphere, measuring up to 7 mm in thickness (for instance as seen on series 6, image 27). Thin acute subdural hemorrhage is also present along the posterior falx and left greater than right tentorium (measuring up to 4 mm in thickness).  Moderate-volume subarachnoid hemorrhage overlying the left greater than right cerebral hemispheres and within the posterior fossa. Mild patchy and ill-defined hypoattenuation within the cerebral white matter, nonspecific but compatible chronic small vessel ischemic disease. 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 There are small foci of pneumocephalus along the left temporal lobe. No acute demarcated cortical infarct No evidence of an intracranial mass. Vascular: No hyperdense vessel.  Atherosclerotic calcifications. Skull: Nondisplaced acute fractures of the right occipital and temporal bones. A fracture lucency likely extends into the right mastoid air cells and courses along the right sigmoid sinus. The right occipital calvarial fracture extends to the foramen magnum. Additionally, small foci of pneumocephalus along the left temporal lobe suggest the presence of an occult left temporal bone fracture. Sinuses/Orbits: No mass or acute finding within the imaged orbits. Minimal mucosal thickening within the bilateral ethmoid sinuses. These results were called by telephone at the time of interpretation on 02/20/2022 at 2:51 pm to provider [facility] , who verbally acknowledged these results. IMPRESSION: 1. Acute subdural hematoma overlying the left cerebral hemisphere, measuring up to 16 mm in thickness. Mass effect with partial effacement of the left lateral ventricle and 14 mm rightward midline shift. No definite uncal herniation is appreciated at this time. 2. Acute subdural hematoma overlying the right cerebral hemisphere, measuring up to 7 mm in thickness. 3. Thin acute subdural hemorrhage along the posterior falx and left greater than right tentorium. 4. Moderate-volume acute subarachnoid hemorrhage scattered along the bilateral cerebral hemispheres, and within the posterior fossa. 5. Acute, nondisplaced right occipital and temporal bone fractures,  as described. The right temporal bone fracture courses in close proximity to the right sigmoid dural venous sinus. Consider CT venography of the head to ensure right sigmoid sinus patency. 6. Small foci of pneumocephalus along the left temporal lobe, suggesting the presence of an occult left temporal bone fracture. Electronically Signed   By: Kellie Simmering D.O.   On: 02/16/2022 14:57    Review of Systems  Unable to perform ROS: Intubated   Blood pressure (!) 104/56, pulse 64, temperature (!) 97.4 F (36.3 C), temperature source Oral, resp. rate 17, height _0  (1.676 m), weight 68 kg, SpO2 98 %. Physical Exam Vitals reviewed.  HENT:     Right Ear: External ear normal.     Left Ear: External ear normal.     Nose: Nose normal.     Mouth/Throat:     Mouth: Mucous membranes are moist.  Eyes:     Conjunctiva/sclera: Conjunctivae normal.  Cardiovascular:     Rate and Rhythm: Normal rate and regular rhythm.     Heart sounds: Murmur heard.  Pulmonary:     Effort: Pulmonary effort is normal.     Breath sounds: Normal breath sounds. No wheezing or rhonchi.  Abdominal:     General: Abdomen is flat. There is no distension.     Palpations: There is no mass.  Musculoskeletal:        General: No deformity or signs of injury.     Cervical back: Neck supple. No rigidity.  Skin:    General: Skin is warm and  dry.     Capillary Refill: Capillary refill takes 2 to 3 seconds.     Findings: No bruising or erythema.  Psychiatric:     Comments: Unable to assess     Assessment/Plan: Fall on blood thinners with massive bilateral subdural hemorrhage.   SAH Skull fx-right occipital and right temporal bone fx  Reports of back pain.  Dr. Zada Finders emergently took patient to the OR. Poor prognosis. If patient stabilizes, may need imaging of back.  No other visible injuries.     Edward Butler 02/09/2022, 8:48 PM

## 2022-02-21 NOTE — Consult Note (Signed)
Neurosurgery Consultation  Reason for Consult: Subdural hematoma / TBI Referring Physician: Ashok Cordia  CC: Fall / TBI  HPI: This is a 81 y.o. man that presents after a suspected syncopal event, falling and hitting his head, is on eliquis for PAF. Just intubated for low GCS, limiting further information from the patient, reversal agent is currently dripping in.    ROS: A 14 point ROS was performed and is negative except as noted in the HPI.   PMHx:  Past Medical History:  Diagnosis Date   Aneurysm ([place])    Atrial fibrillation ([place])    CAP (community acquired pneumonia) 10/20/2021   Cardiac tamponade    Diabetes mellitus without complication ([place])    Hypertension    Non-recurrent acute suppurative otitis media of right ear without spontaneous rupture of tympanic membrane 07/04/2021   Stroke [facility])    FamHx: History reviewed. No pertinent family history. SocHx:  reports that he has never smoked. He has never used smokeless tobacco. He reports that he does not currently use alcohol. He reports that he does not currently use drugs.  Exam: Vital signs in last 24 hours: Temp:  [97.4 F (36.3 C)] 97.4 F (36.3 C) (07/22 1132) Pulse Rate:  [84-92] 90 (07/22 1545) Resp:  [21-32] 32 (07/22 1545) BP: (143-180)/(70-89) 180/80 (07/22 1545) SpO2:  [92 %-99 %] 99 % (07/22 1600) FiO2 (%):  [100 %] 100 % (07/22 1600) Weight:  [68 kg] 68 kg (07/22 1139) General: Intubated, in hospital bed, appears acutely ill Head: Normocephalic, some stigmata of trauma with small areas of blood on his linens HEENT: in well-fitting rigid cervical collar Pulmonary: intubated, ETT secured, good chest rise bilaterally Cardiac: mildly tachy Abdomen: S NT ND Extremities: Warm and well perfused x4 Neuro: intubated, left pupil non-reactive, right reactive, +corneals present bilatearlly and intact c/g Minimal w/d bilaterally to painful stim   Assessment and Plan: 81 y.o. man s/p fall on eliquis. [place] personally  reviewed, which shows tSAH, L acute SDH 1.6cm with 1.4cm MLS, small falcine bilaterally, likely left temporal contusion, small dot of pneumo on L without obvious temporal frx  -OR for L crani for SDH evacuation, discussed uncertain outcome with family, already getting reversal, can also give 1gm/kg of mannitol to help reduce ICPs while waiting on [place], MD 02/28/2022 4:22 PM [facility]

## 2022-02-21 NOTE — Progress Notes (Signed)
   02/11/2022 1200  Clinical Encounter Type  Visited With Patient  Visit Type Trauma  Referral From Nurse  Consult/Referral To Chaplain   Chaplain responded to a trauma level 2 page, where medical team was actively attending to the pt.  No family members were present, but the chaplain remains available for any support that might be needed later.  Hubert Azure, Chaplain  Page: [phone]

## 2022-02-21 NOTE — ED Notes (Signed)
Cleviprex stopped EDP aware, to keep SBP in desired range.

## 2022-02-21 NOTE — Op Note (Signed)
PATIENT: Edward Butler  DAY OF SURGERY: 02/08/2022   PRE-OPERATIVE DIAGNOSIS:  Left subdural hematoma   POST-OPERATIVE DIAGNOSIS:  Same   PROCEDURE:  Left craniotomy for evacuation of subdural hematoma   SURGEON:  Surgeon(s) and Role:    Judith Part, MD - Primary   ANESTHESIA: ETGA   BRIEF HISTORY: This is an 81 year old man who presented after a suspected syncopal event with a subsequent fall, striking his head. He was brought to the ED and had progressive obtundation, was intubated, and CTH showed a large left SDH. His anticoagulation was reversed. I recommended craniotomy for evacuation of the subdural hematoma. This was discussed with the patient's family and they wished to proceed with surgery.   OPERATIVE DETAIL: The patient was taken to the operating room, anesthesia was induced by the anesthesia team, and the patient was placed on the OR table in the supine position. A formal time out was performed with two patient identifiers and confirmed the operative site. The head was turned to the right and placed on a gel roll. The operative site was marked, hair was clipped with surgical clippers, the area was then prepped and draped in a sterile fashion. A retroauricular hemicraniectomy incision was created on the left side of the head to preserve blood supply over a reverse question mark.   A large craniotomy flap was turned, as soon as the dura was opened, there was egress of hematoma under very high pressure. The majority of the clot was quite adherent. I carefully removed it, but there were numerous bridging veins inside the hematoma. I started coagulating through the hematoma to try and get these, but it was still causing significant bleeding from essentially all locations. After evacuating the clot, the brain appeared diffusely and erythematous and swollen, which was concerning. When I opened there were a few bridging veins bleeding, but everywhere I removed hematoma I got more  bleeding, despite changing techniques. I used multiple different hemostatic techniques, controlling everything bleeding in the exposed field well. I finally got the periphery controlled by packing off all the bleeding along the edges with gelfoam out to the falx.   I placed a ventricular catheter into the subdural space, reapproximated the dura, and replaced the bone flap, making sure the drain was free from tension by using a "Pac-man" burr hole cover and appeared that it would be easily removed later. The subdural drain was tunneled and secured.  The wound was copiously irrigated, all instrument and sponge counts were correct, and the incision was then closed in layers. The patient had lost somewhere around 750cc at this point and anesthesia gave 1U pRBC. The patient was then returned to anesthesia for emergence. No apparent complications at the completion of the procedure.   EBL:  758m   DRAINS: subdural drain   SPECIMENS: none   TJudith Part MD 02/17/2022 6:27 PM

## 2022-02-21 NOTE — Progress Notes (Signed)
Orthopedic Tech Progress Note Patient Details:  Edward Butler 01/31/1941 [id number]  Level 2 trauma, ortho tech services not needed at this time.  Patient ID: Edward Butler, male   DOB: 1941-01-01, 81 y.o.   MRN: [id number]  Edward Butler 02/26/2022, 11:42 AM

## 2022-02-21 NOTE — ED Notes (Signed)
Zole placed in room with pads on pt & all equipment is ready for intubation.

## 2022-02-21 NOTE — Anesthesia Procedure Notes (Signed)
Arterial Line Insertion Start/End07/26/2023 6:40 PM, 02/01/2022 6:45 PM Performed by: CRNA  Patient location: OR. Preanesthetic checklist: patient identified, IV checked, site marked, risks and benefits discussed, surgical consent, monitors and equipment checked, pre-op evaluation, timeout performed and anesthesia consent Lidocaine 1% used for infiltration Right, radial was placed Catheter size: 20 G Hand hygiene performed  and maximum sterile barriers used   Attempts: 1 Procedure performed without using ultrasound guided technique. Following insertion, dressing applied and Biopatch. Post procedure assessment: normal and unchanged  Patient tolerated the procedure well with no immediate complications.

## 2022-02-21 NOTE — ED Notes (Signed)
NRB mask put on d/t decreased orientation & beginning to snore, airway patent at this time. Pt was able to respond to voice. EDP at bedside

## 2022-02-21 NOTE — ED Provider Notes (Addendum)
[facility] EMERGENCY DEPARTMENT Provider Note   CSN: [id number] Arrival date & time: 03/02/2022  1130     History  Chief Complaint  Patient presents with   Level 2- Fall on thinners   Loss of Consciousness    Edward Butler is a 81 y.o. male.  Patient with hx severe AS, recent admission with pericardial effusion s/p pericardiocentesis, afib on eliquis, presents s/p fall and head contusion. Pt indicates was trying to lift garage door, was heavy, and slipped/fell back and hit head. ?brief loc. Contusion to posterior scalp. Tetanus is up to date per pt. Denies severe headache. No neck or back pain. No nv. No chest pain or sob. No abd pain or nv. No rectal bleeding or melena. No dysuria, although feels has to urinate now. No numbness/weakness. Denies change in meds/new meds. No abnormal bruising or bleeding. Did eat today. Denies feeling faint prior to fall.   The history is provided by the patient, medical records and the EMS personnel.  Loss of Consciousness Associated symptoms: no chest pain, no confusion, no fever, no headaches, no palpitations, no shortness of breath, no vomiting and no weakness        Home Medications Prior to Admission medications   Medication Sig Start Date End Date Taking? Authorizing Provider  Accu-Chek FastClix Lancets MISC Use to check blood sugar once daily. E11.9 02/26/21   Elsie Stain, MD  acetaminophen (TYLENOL) 500 MG tablet Take 1,000 mg by mouth daily as needed for moderate pain.    [provider]  amiodarone (PACERONE) 200 MG tablet Take 2 tablets (400 mg total) by mouth 2 (two) times daily. X 1 week. Than two (2) tablets (400 mg) daily X 2 weeks. Than one(1) tablet by mouth (200 mg) daily. 02/10/22   Baldwin Jamaica, PA-C  apixaban (ELIQUIS) 5 MG TABS tablet Take 1 tablet (5 mg total) by mouth 2 (two) times daily. 02/07/22   Isaiah Serge, NP  Blood Glucose Monitoring Suppl (ACCU-CHEK GUIDE ME) w/Device KIT USE AS  DIRECTED 09/19/21   Elsie Stain, MD  Calcium Carbonate-Vitamin D (CALCIUM 600+D PO) Take 2 tablets by mouth daily.    [provider]  cholecalciferol (VITAMIN D3) 25 MCG (1000 UNIT) tablet Take 1,000 Units by mouth daily.    [provider]  diltiazem (CARDIZEM CD) 180 MG 24 hr capsule Take 1 capsule (180 mg total) by mouth daily. 02/05/22   Isaiah Serge, NP  doxycycline (VIBRA-TABS) 100 MG tablet Take 1 tablet (100 mg total) by mouth 2 (two) times daily for 7 days. 02/19/22 02/26/22  Elsie Stain, MD  furosemide (LASIX) 40 MG tablet Take 1 tablet (40 mg total) by mouth daily. 02/05/22   Isaiah Serge, NP  glucose blood (ACCU-CHEK GUIDE) test strip USE TO CHECK BLOOD SUGAR ONCE DAILY. 09/19/21   Elsie Stain, MD  levothyroxine (SYNTHROID) 25 MCG tablet Take 25 mcg by mouth daily before breakfast. 12/03/18   [provider]  magnesium oxide (MAG-OX) 400 MG tablet Take 400 mg by mouth daily. 12/13/18   [provider]  metFORMIN (GLUCOPHAGE-XR) 500 MG 24 hr tablet Take 2 tablets (1,000 mg total) by mouth 2 (two) times daily. 09/19/21   Elsie Stain, MD  simvastatin (ZOCOR) 10 MG tablet Take 10 mg by mouth daily. 09/28/18   [provider]      Allergies    Metoprolol    Review of Systems   Review of Systems  Constitutional:  Negative for chills and fever.  HENT:  Negative for nosebleeds.   Eyes:  Negative for visual disturbance.  Respiratory:  Negative for cough and shortness of breath.   Cardiovascular:  Positive for syncope. Negative for chest pain, palpitations and leg swelling.  Gastrointestinal:  Negative for abdominal pain, blood in stool, diarrhea and vomiting.  Genitourinary:  Negative for dysuria and flank pain.  Musculoskeletal:  Negative for back pain and neck pain.  Skin:  Negative for rash.  Neurological:  Negative for weakness, numbness and headaches.  Psychiatric/Behavioral:  Negative for confusion.     Physical  Exam Updated Vital Signs BP (!) 160/70 (BP Location: Left Arm)   Pulse 85   Temp (!) 97.4 F (36.3 C) (Oral)   Resp (!) 25   Ht 1.676 m ($Remove'5\' 6"'sFGfCTa$ )   Wt 68 kg   SpO2 99%   BMI 24.21 kg/m  Physical Exam Vitals and nursing note reviewed.  Constitutional:      Appearance: Normal appearance. He is well-developed.  HENT:     Head:     Comments: Contusion/abrasion to posterior scalp.     Nose: Nose normal.     Mouth/Throat:     Mouth: Mucous membranes are moist.     Pharynx: Oropharynx is clear.  Eyes:     General: No scleral icterus.    Conjunctiva/sclera: Conjunctivae normal.     Pupils: Pupils are equal, round, and reactive to light.  Neck:     Vascular: No carotid bruit.     Trachea: No tracheal deviation.  Cardiovascular:     Rate and Rhythm: Normal rate and regular rhythm.     Pulses: Normal pulses.     Heart sounds: Murmur heard.     No friction rub. No gallop.  Pulmonary:     Effort: Pulmonary effort is normal. No accessory muscle usage or respiratory distress.     Breath sounds: Normal breath sounds.  Abdominal:     General: Bowel sounds are normal. There is no distension.     Palpations: Abdomen is soft.     Tenderness: There is no abdominal tenderness. There is no guarding.  Genitourinary:    Comments: No cva tenderness. Musculoskeletal:        General: No swelling.     Cervical back: Normal range of motion and neck supple. No rigidity or tenderness.     Comments: CTLS spine, non tender, aligned, no step off. Good rom bil extremities without pain or focal bony tenderness.   Skin:    General: Skin is warm and dry.     Findings: No rash.  Neurological:     Mental Status: He is alert.     Comments: Alert, speech clear. GCS 15. Motor/sens grossly intact bil.   Psychiatric:        Mood and Affect: Mood normal.     ED Results / Procedures / Treatments   Labs (all labs ordered are listed, but only abnormal results are displayed) Results for orders placed or  performed during the hospital encounter of 02/25/2022  CBC  Result Value Ref Range   WBC 9.7 4.0 - 10.5 K/uL   RBC 5.00 4.22 - 5.81 MIL/uL   Hemoglobin 15.1 13.0 - 17.0 g/dL   HCT 44.4 39.0 - 52.0 %   MCV 88.8 80.0 - 100.0 fL   MCH 30.2 26.0 - 34.0 pg   MCHC 34.0 30.0 - 36.0 g/dL   RDW 14.3 11.5 - 15.5 %   Platelets  189 150 - 400 K/uL   nRBC 0.0 0.0 - 0.2 %  Comprehensive metabolic panel  Result Value Ref Range   Sodium 137 135 - 145 mmol/L   Potassium 3.6 3.5 - 5.1 mmol/L   Chloride 104 98 - 111 mmol/L   CO2 20 (L) 22 - 32 mmol/L   Glucose, Bld 262 (H) 70 - 99 mg/dL   BUN 20 8 - 23 mg/dL   Creatinine, Ser 1.07 0.61 - 1.24 mg/dL   Calcium 9.6 8.9 - 10.3 mg/dL   Total Protein 6.8 6.5 - 8.1 g/dL   Albumin 3.9 3.5 - 5.0 g/dL   AST 32 15 - 41 U/L   ALT 23 0 - 44 U/L   Alkaline Phosphatase 53 38 - 126 U/L   Total Bilirubin 1.1 0.3 - 1.2 mg/dL   GFR, Estimated >60 >60 mL/min   Anion gap 13 5 - 15  Urinalysis, Routine w reflex microscopic Urine, Clean Catch  Result Value Ref Range   Color, Urine STRAW (A) YELLOW   APPearance CLEAR CLEAR   Specific Gravity, Urine 1.008 1.005 - 1.030   pH 5.0 5.0 - 8.0   Glucose, UA 50 (A) NEGATIVE mg/dL   Hgb urine dipstick NEGATIVE NEGATIVE   Bilirubin Urine NEGATIVE NEGATIVE   Ketones, ur NEGATIVE NEGATIVE mg/dL   Protein, ur NEGATIVE NEGATIVE mg/dL   Nitrite NEGATIVE NEGATIVE   Leukocytes,Ua NEGATIVE NEGATIVE  Protime-INR  Result Value Ref Range   Prothrombin Time 17.4 (H) 11.4 - 15.2 seconds   INR 1.4 (H) 0.8 - 1.2  APTT  Result Value Ref Range   aPTT 31 24 - 36 seconds  Type and screen [place]  Result Value Ref Range   ABO/RH(D) PENDING    Antibody Screen PENDING    Sample Expiration      02/24/2022,2359 Performed at [facility], [place] [address] [place],  [address]   ABO/Rh  Result Value Ref Range   ABO/RH(D) PENDING    CUP PACEART INCLINIC DEVICE CHECK  Result Date: 02/10/2022 Pacemaker  check in clinic. Normal device function. Thresholds, sensing, impedances consistent with previous measurements. Device programmed to maximize longevity. + mode switch no high ventricular rates noted. Device programmed at appropriate safety margins. Histogram distribution appropriate for patient activity level. Device programmed to optimize intrinsic conduction. Estimated longevity _15 years__. Patient enrolled in remote follow-up. Patient education completed.  ECHOCARDIOGRAM LIMITED  Result Date: 02/04/2022    ECHOCARDIOGRAM LIMITED REPORT   Patient Name:   Edward Butler Date of Exam: 02/04/2022 Medical Rec #:  [id number]     Height:       66.0 in Accession #:    [id number]    Weight:       150.8 lb Date of Birth:  11/27/1940      BSA:          1.774 m Patient Age:    26 years      BP:           131/74 mmHg Patient Gender: M             HR:           73 bpm. Exam Location:  Inpatient Procedure: Limited Echo Indications:    F/U pericardial effusion  History:        Patient has prior history of Echocardiogram examinations, most                 recent 01/31/2022. Abnormal ECG,  Aortic Valve Disease and Mitral                 Valve Disease, Arrythmias:Atrial Fibrillation, Tachycardia and                 Bradycardia; Risk Factors:Diabetes, Dyslipidemia and                 Hypertension.  Sonographer:    Melissa Morford RDCS (AE, PE) Referring Phys: [id number] PAULA V ROSS IMPRESSIONS  1. Trivial circumferential pericardial fluid with dense thick fibrous tissue in the pericardial space with noted strands.  2. Left ventricular ejection fraction, by estimation, is 60 to 65%. The left ventricle has normal function. There is moderate concentric left ventricular hypertrophy. Left ventricular diastolic function could not be evaluated.  3. Tricuspid regurgitation signal is inadequate for assessing PA pressure.  4. The inferior vena cava is normal in size with greater than 50% respiratory variability, suggesting right atrial pressure of 3  mmHg. Comparison(s): A prior study was performed on 01/31/2022. No significant change from prior study. FINDINGS  Left Ventricle: Left ventricular ejection fraction, by estimation, is 60 to 65%. The left ventricle has normal function. There is moderate concentric left ventricular hypertrophy. Left ventricular diastolic function could not be evaluated. Right Ventricle: Tricuspid regurgitation signal is inadequate for assessing PA pressure. Pericardium: Trivial circumferential pericardial fluid with dense thick fibrous tissue in the pericardial space with noted strands. Venous: The inferior vena cava is normal in size with greater than 50% respiratory variability, suggesting right atrial pressure of 3 mmHg. LEFT VENTRICLE PLAX 2D LVIDd:         4.70 cm LVIDs:         3.80 cm LV PW:         1.40 cm LV IVS:        1.50 cm  IVC IVC diam: 1.89 cm Kardie Tobb DO Electronically signed by Berniece Salines DO Signature Date/Time: 02/04/2022/11:20:45 AM    Final    CARDIAC CATHETERIZATION  Result Date: 02/02/2022 Pericardial tamponade Successful pericardiocentesis with removal of 510 cc of bloody fluid Will monitor in the ICU overnight with drain in place. If output is down tomorrow, OK to remove in the morning.   DG CHEST PORT 1 VIEW  Result Date: 02/01/2022 CLINICAL DATA:  Chest pain.  Follow-up exam. EXAM: PORTABLE CHEST 1 VIEW COMPARISON:  01/30/2022 and older studies. FINDINGS: Cardiac silhouette mildly enlarged. No mediastinal or hilar masses. There is a catheter that projects along the left heart or, which may reflect a femorally placed central catheter. It is not well visualized, and not seen on the prior exams. Opacity at both bases obscuring hemidiaphragms consistent with a combination of pleural effusions and atelectasis and/or infiltrate. Remainder of the lungs is clear. No pneumothorax. Stable left anterior chest wall pacemaker. IMPRESSION: 1. Bilateral effusions and lung base atelectasis and/or pneumonia is without  significant change from the most recent prior study. No new lung abnormalities. No convincing pulmonary edema. 2. Possible new femorally placed central catheter projects along the left heart border, not well visualized. Electronically Signed   By: Lajean Manes M.D.   On: 02/01/2022 10:26   ECHOCARDIOGRAM LIMITED  Result Date: 01/31/2022    ECHOCARDIOGRAM LIMITED REPORT   Patient Name:   Edward Butler Date of Exam: 01/31/2022 Medical Rec #:  [id number]     Height:       66.0 in Accession #:    [id number]    Weight:  157.4 lb Date of Birth:  05/05/1941      BSA:          1.806 m Patient Age:    78 years      BP:           138/24 mmHg Patient Gender: M             HR:           121 bpm. Exam Location:  Inpatient Procedure: Limited Echo Indications:    I31.3 Pericardial effusion (noninflammatory)  History:        Patient has prior history of Echocardiogram examinations, most                 recent 01/30/2022. Abnormal ECG, Aortic Valve Disease and Mitral                 Valve Disease, Arrythmias:Atrial Fibrillation, Tachycardia and                 Bradycardia; Risk Factors:Hypertension, Diabetes, Dyslipidemia                 and Sleep Apnea. Cardiac tamponade. Severe aortic stenosis.                 Moderate MR.  Sonographer:    Roseanna Rainbow RDCS Referring Phys: Lelon Perla  Sonographer Comments: Technically difficult study due to poor echo windows. Limited echo for pericardial effusion. Pericardial drain in subc region. IMPRESSIONS  1. Limited study to FU pericardial effusion; trivial effusion noted.  2. Left ventricular ejection fraction, by estimation, is 60 to 65%. The left ventricle has normal function. The left ventricle has no regional wall motion abnormalities.  3. Right ventricular systolic function is normal. The right ventricular size is normal.  4. Left atrial size was mildly dilated.  5. The inferior vena cava is normal in size with greater than 50% respiratory variability, suggesting right atrial  pressure of 3 mmHg. FINDINGS  Left Ventricle: Left ventricular ejection fraction, by estimation, is 60 to 65%. The left ventricle has normal function. The left ventricle has no regional wall motion abnormalities. The left ventricular internal cavity size was normal in size. Right Ventricle: The right ventricular size is normal. Right ventricular systolic function is normal. Left Atrium: Left atrial size was mildly dilated. Right Atrium: Right atrial size was normal in size. Pericardium: Trivial pericardial effusion is present. Aorta: The aortic root is normal in size and structure. Venous: The inferior vena cava is normal in size with greater than 50% respiratory variability, suggesting right atrial pressure of 3 mmHg. Additional Comments: Limited study to FU pericardial effusion; trivial effusion noted. A device lead is visualized.  LV Volumes (MOD) LV vol d, MOD A2C: 44.0 ml LV vol d, MOD A4C: 36.2 ml LV vol s, MOD A2C: 21.5 ml LV vol s, MOD A4C: 15.1 ml LV SV MOD A2C:     22.5 ml LV SV MOD A4C:     36.2 ml LV SV MOD BP:      23.3 ml IVC IVC diam: 2.00 cm Kirk Ruths MD Electronically signed by Kirk Ruths MD Signature Date/Time: 01/31/2022/12:33:55 PM    Final    VAS [place] ABI WITH/WO TBI  Result Date: 01/31/2022  LOWER EXTREMITY DOPPLER STUDY Patient Name:  Edward Butler  Date of Exam:   01/30/2022 Medical Rec #: [id number]      Accession #:    [id number] Date of Birth: 08-19-1940  Patient Gender: M Patient Age:   81 years Exam Location:  [facility] Procedure:      VAS [place] ABI WITH/WO TBI Referring Phys: Regan Lemming --------------------------------------------------------------------------------  Indications: Limb ischemia, left. High Risk Factors: Hypertension, Diabetes, no history of smoking.  Performing Technologist: Bobetta Lime BS RVT  Examination Guidelines: A complete evaluation includes at minimum, Doppler waveform signals and systolic blood pressure reading at the level of bilateral  brachial, anterior tibial, and posterior tibial arteries, when vessel segments are accessible. Bilateral testing is considered an integral part of a complete examination. Photoelectric Plethysmograph (PPG) waveforms and toe systolic pressure readings are included as required and additional duplex testing as needed. Limited examinations for reoccurring indications may be performed as noted.  ABI Findings: +---------+------------------+-----+---------+----------------+ Right    Rt Pressure (mmHg)IndexWaveform Comment          +---------+------------------+-----+---------+----------------+ Brachial 127                                              +---------+------------------+-----+---------+----------------+ PTA      255               1.93 triphasicNon-compressible +---------+------------------+-----+---------+----------------+ DP       255               1.93 triphasicNon-compressible +---------+------------------+-----+---------+----------------+ Great Toe113               0.86                           +---------+------------------+-----+---------+----------------+ +---------+------------------+-----+----------+-------+ Left     Lt Pressure (mmHg)IndexWaveform  Comment +---------+------------------+-----+----------+-------+ Brachial 132                                      +---------+------------------+-----+----------+-------+ PTA      88                0.67 monophasic        +---------+------------------+-----+----------+-------+ DP       89                0.67 monophasic        +---------+------------------+-----+----------+-------+ Great Toe0                 0.00 Absent            +---------+------------------+-----+----------+-------+ +-------+-----------+-----------+------------+------------+ ABI/TBIToday's ABIToday's TBIPrevious ABIPrevious TBI +-------+-----------+-----------+------------+------------+ Right  [place]         0.86                                 +-------+-----------+-----------+------------+------------+ Left   0.67       0                                   +-------+-----------+-----------+------------+------------+ The left great toe pressure is deferred due to absent waveform in the left great toe.  Summary: Right: Resting right ankle-brachial index indicates noncompressible right lower extremity arteries. The right toe-brachial index is normal. Left: Resting left ankle-brachial index indicates moderate left lower extremity arterial disease. The left toe-brachial index is abnormal. *See table(s) above for measurements and observations.  Electronically signed  by Jamelle Haring on 01/31/2022 at 9:42:08 AM.    Final    ECHOCARDIOGRAM LIMITED  Result Date: 01/30/2022    ECHOCARDIOGRAM LIMITED REPORT   Patient Name:   Edward Butler Date of Exam: 01/30/2022 Medical Rec #:  [id number]     Height:       66.0 in Accession #:    [id number]    Weight:       165.1 lb Date of Birth:  [birth year]      BSA:          1.843 m Patient Age:    52 years      BP:           147/99 mmHg Patient Gender: M             HR:           117 bpm. Exam Location:  Inpatient Procedure: Limited Echo Indications:    pericardiocentesis  History:        Patient has prior history of Echocardiogram examinations, most                 recent 01/30/2022. Arrythmias:Atrial Fibrillation; Risk                 Factors:Diabetes, Hypertension and Dyslipidemia.  Sonographer:    Johny Chess RDCS Referring Phys: [place]  1. Limited study with 3 off axis apical images Post pericardiocentesis with improvement in moderate effusion seen on TTE earlier today Only trival effusion remaining with improved RV diastolic expansion. FINDINGS  Additional Comments: Limited study with 3 off axis apical images Post pericardiocentesis with improvement in moderate effusion seen on TTE earlier today Only trival effusion remaining with improved RV diastolic expansion.  Jenkins Rouge MD Electronically signed by Jenkins Rouge MD Signature Date/Time: 01/30/2022/4:56:24 PM    Final    ECHOCARDIOGRAM LIMITED  Result Date: 01/30/2022    ECHOCARDIOGRAM LIMITED REPORT   Patient Name:   Edward Butler Date of Exam: 01/30/2022 Medical Rec #:  [id number]     Height:       66.0 in Accession #:    [id number]    Weight:       165.1 lb Date of Birth:  Jun 13, 1941      BSA:          1.843 m Patient Age:    48 years      BP:           129/91 mmHg Patient Gender: M             HR:           120 bpm. Exam Location:  Inpatient Procedure: Limited Echo and Cardiac Doppler Indications:    Pericardial Effusion  History:        Patient has prior history of Echocardiogram examinations.                 Pacemaker, Aortic Valve Disease; Risk Factors:Hypertension,                 Diabetes and Dyslipidemia.  Sonographer:    Meagan Baucom RDCS, FE, PE Referring Phys: [place]  1. Limited echo but known moderate MR and severe AS.  2. Left ventricular ejection fraction, by estimation, is 55 to 60%. The left ventricle has normal function.  3. Pacing wires in RA/RV.  4. Compared to TTE done yesterday effusion seems larger especially in apical and subcostal views Patient in rapid afib but  appears to have more drop in mitral inflows with inspiration and RV diastolic collapse IVC remains dilated over 2 as before Would have concern for tamponade especially if on blood thinners for afib . Large pericardial effusion. The pericardial effusion is circumferential. FINDINGS  Left Ventricle: Left ventricular ejection fraction, by estimation, is 55 to 60%. The left ventricle has normal function. The left ventricular internal cavity size was normal in size. Right Ventricle: Pacing wires in RA/RV. Pericardium: Compared to TTE done yesterday effusion seems larger especially in apical and subcostal views Patient in rapid afib but appears to have more drop in mitral inflows with inspiration and RV diastolic collapse IVC  remains dilated over 2 as before Would have concern for tamponade especially if on blood thinners for afib. A large pericardial effusion is present. The pericardial effusion is circumferential. Additional Comments: Limited echo but known moderate MR and severe AS. Charlton Haws MD Electronically signed by Charlton Haws MD Signature Date/Time: 01/30/2022/11:44:17 AM    Final    CT HEAD WO CONTRAST ( )  Result Date: 01/30/2022 CLINICAL DATA:  Neurological deficit, suspected stroke. History atrial fibrillation, diabetes mellitus, hypertension, stroke EXAM: CT HEAD WITHOUT CONTRAST TECHNIQUE: Contiguous axial images were obtained from the base of the skull through the vertex without intravenous contrast. RADIATION DOSE REDUCTION: This exam was performed according to the departmental dose-optimization program which includes automated exposure control, adjustment of the mA and/or kV according to patient size and/or use of iterative reconstruction technique. COMPARISON:  01/28/2022 FINDINGS: Brain: Generalized atrophy. Normal ventricular morphology. No midline shift or mass effect. Small vessel chronic ischemic changes of deep cerebral white matter. Again identified infarct LEFT periventricular white matter. Area of high attenuation at the RIGHT parietal lobe on previous exam not identified on this con contrast study. No intracranial hemorrhage, mass lesion, or new area of infarction. No extra-axial fluid collections. Vascular: No hyperdense vessels. Atherosclerotic calcifications of internal carotid and vertebral arteries at skull base Skull: Intact Sinuses/Orbits: Clear Other: N/A IMPRESSION: Atrophy with small vessel chronic ischemic changes of deep cerebral white matter. 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 No acute intracranial abnormalities. Area of high attenuation at RIGHT parietal lobe on previous exam not identified on current noncontrast study, likely represented enhancement of uncertain etiology.  Electronically Signed   By: Ulyses Southward M.D.   On: 01/30/2022 08:42   DG CHEST PORT 1 VIEW  Result Date: 01/30/2022 CLINICAL DATA:  81 year old male with history of shortness of breath and pleural effusion. EXAM: PORTABLE CHEST 1 VIEW COMPARISON:  Chest x-ray 01/28/2022. FINDINGS: Skin fold artifact projecting over the lateral right hemithorax incidentally noted. Lung volumes are slightly low. Bibasilar opacities which may reflect areas of atelectasis and/or consolidation. Small bilateral pleural effusions. No pneumothorax. No evidence of pulmonary edema. Heart size is mild heart size is enlarged. Upper mediastinal contours are distorted by patient positioning. Atherosclerotic calcifications in the thoracic aorta. Left-sided pacemaker device in place with lead tips projecting over the expected location of the right atrium and right ventricle. IMPRESSION: 1. Low lung volumes with bibasilar opacities favored to reflect areas of atelectasis (airspace consolidation is not excluded), with superimposed small bilateral pleural effusions. 2. Cardiomegaly. 3. Aortic atherosclerosis. Electronically Signed   By: Trudie Reed M.D.   On: 01/30/2022 06:27   ECHOCARDIOGRAM COMPLETE  Result Date: 01/29/2022    ECHOCARDIOGRAM REPORT   Patient Name:   Edward Butler Date of Exam: 01/29/2022 Medical Rec #:  [id number]     Height:  66.0 in Accession #:    [id number]    Weight:       164.2 lb Date of Birth:  [birth year]      BSA:          1.839 m Patient Age:    69 years      BP:           108/67 mmHg Patient Gender: M             HR:           69 bpm. Exam Location:  Inpatient Procedure: 2D Echo, Cardiac Doppler and Color Doppler STAT ECHO Indications:    Aortic valve disorder  History:        Patient has prior history of Echocardiogram examinations. Risk                 Factors:Dyslipidemia, Diabetes and Hypertension.  Sonographer:    Jyl Heinz Referring Phys: [id number] Regan Lemming IMPRESSIONS  1. Left ventricular  ejection fraction, by estimation, is 60 to 65%. The left ventricle has normal function. The left ventricle has no regional wall motion abnormalities. There is mild concentric left ventricular hypertrophy. Left ventricular diastolic parameters are consistent with Grade II diastolic dysfunction (pseudonormalization). Elevated left ventricular end-diastolic pressure.  2. Right ventricular systolic function is mildly reduced. The right ventricular size is normal. There is moderately elevated pulmonary artery systolic pressure. The estimated right ventricular systolic pressure is 29.4 mmHg.  3. Left atrial size was mildly dilated.  4. Right atrial size was mildly dilated.  5. Pericardial effusion measures 1.51cm posteriorly at greatest diameter.. Moderate pericardial effusion. The pericardial effusion is circumferential.  6. The mitral valve is degenerative. Mild to moderate mitral valve regurgitation. No evidence of mitral stenosis. Moderate mitral annular calcification.  7. The aortic valve is calcified. There is severe calcifcation of the aortic valve. There is severe thickening of the aortic valve. Aortic valve regurgitation is mild. Severe aortic valve stenosis. Aortic regurgitation PHT measures 485 msec. Aortic valve area, by VTI measures 0.74 cm. Aortic valve mean gradient measures 42.0 mmHg. Aortic valve Vmax measures 4.05 m/s.  8. The inferior vena cava is normal in size with greater than 50% respiratory variability, suggesting right atrial pressure of 3 mmHg.  9. Compared to study dated 10/29/2021, the mean AVG has increased from 36.66mg to 415mg, DVI has decreased from 0.35 to 0.24, Vmax has increased from 3.8859mto 4.35m63mnd AVA has decreased from 0.99cm2 to 0.74cm2 all consistent with now severe AS. FINDINGS  Left Ventricle: Left ventricular ejection fraction, by estimation, is 60 to 65%. The left ventricle has normal function. The left ventricle has no regional wall motion abnormalities. The left  ventricular internal cavity size was normal in size. There is  mild concentric left ventricular hypertrophy. Left ventricular diastolic parameters are consistent with Grade II diastolic dysfunction (pseudonormalization). Elevated left ventricular end-diastolic pressure. Right Ventricle: The right ventricular size is normal. No increase in right ventricular wall thickness. Right ventricular systolic function is mildly reduced. There is moderately elevated pulmonary artery systolic pressure. The tricuspid regurgitant velocity is 2.84 m/s, and with an assumed right atrial pressure of 15 mmHg, the estimated right ventricular systolic pressure is 47.376.5g. Left Atrium: Left atrial size was mildly dilated. Right Atrium: Right atrial size was mildly dilated. Pericardium: Pericardial effusion measures 1.51cm posteriorly at greatest diameter. A moderately sized pericardial effusion is present. The pericardial effusion is circumferential. Mitral Valve: The mitral valve is degenerative in appearance. There  is mild calcification of the mitral valve leaflet(s). Moderate mitral annular calcification. Mild to moderate mitral valve regurgitation. No evidence of mitral valve stenosis. MV peak gradient, 8.9 mmHg. The mean mitral valve gradient is 3.0 mmHg. Tricuspid Valve: The tricuspid valve is normal in structure. Tricuspid valve regurgitation is mild . No evidence of tricuspid stenosis. Aortic Valve: The aortic valve is calcified. There is severe calcifcation of the aortic valve. There is severe thickening of the aortic valve. Aortic valve regurgitation is mild. Aortic regurgitation PHT measures 485 msec. Severe aortic stenosis is present. Aortic valve mean gradient measures 42.0 mmHg. Aortic valve peak gradient measures 65.6 mmHg. Aortic valve area, by VTI measures 0.74 cm. Pulmonic Valve: The pulmonic valve was normal in structure. Pulmonic valve regurgitation is not visualized. No evidence of pulmonic stenosis. Aorta: The  aortic root is normal in size and structure. Venous: The inferior vena cava is normal in size with greater than 50% respiratory variability, suggesting right atrial pressure of 3 mmHg. IAS/Shunts: No atrial level shunt detected by color flow Doppler. Additional Comments: A device lead is visualized.  LEFT VENTRICLE PLAX 2D LVIDd:         4.20 cm      Diastology LVIDs:         2.70 cm      LV e' medial:    4.99 cm/s LV PW:         1.20 cm      LV E/e' medial:  29.5 LV IVS:        1.40 cm      LV e' lateral:   4.21 cm/s LVOT diam:     2.00 cm      LV E/e' lateral: 34.9 LV SV:         76 LV SV Index:   41 LVOT Area:     3.14 cm  LV Volumes (MOD) LV vol d, MOD A2C: 90.5 ml LV vol d, MOD A4C: 102.0 ml LV vol s, MOD A2C: 36.1 ml LV vol s, MOD A4C: 38.1 ml LV SV MOD A2C:     54.4 ml LV SV MOD A4C:     102.0 ml LV SV MOD BP:      60.6 ml RIGHT VENTRICLE            IVC RV Basal diam:  3.60 cm    IVC diam: 2.30 cm RV Mid diam:    3.20 cm RV S prime:     7.36 cm/s TAPSE (M-mode): 1.5 cm LEFT ATRIUM             Index        RIGHT ATRIUM           Index LA diam:        4.50 cm 2.45 cm/m   RA Area:     20.40 cm LA Vol (A2C):   51.8 ml 28.16 ml/m  RA Volume:   62.20 ml  33.82 ml/m LA Vol (A4C):   84.5 ml 45.94 ml/m LA Biplane Vol: 72.1 ml 39.20 ml/m  AORTIC VALVE AV Area (Vmax):    0.78 cm AV Area (Vmean):   0.83 cm AV Area (VTI):     0.74 cm AV Vmax:           405.00 cm/s AV Vmean:          290.600 cm/s AV VTI:            1.020 m AV Peak Grad:  65.6 mmHg AV Mean Grad:      42.0 mmHg LVOT Vmax:         100.70 cm/s LVOT Vmean:        77.150 cm/s LVOT VTI:          0.242 m LVOT/AV VTI ratio: 0.24 AI PHT:            485 msec  AORTA Ao Root diam: 2.80 cm Ao Asc diam:  3.00 cm MITRAL VALVE                TRICUSPID VALVE MV Area (PHT): 2.83 cm     TR Peak grad:   32.3 mmHg MV Area VTI:   1.92 cm     TR Vmax:        284.00 cm/s MV Peak grad:  8.9 mmHg MV Mean grad:  3.0 mmHg     SHUNTS MV Vmax:       1.49 m/s     Systemic  VTI:  0.24 m MV Vmean:      85.3 cm/s    Systemic Diam: 2.00 cm MV Decel Time: 268 msec MR Peak grad: 138.9 mmHg MR Mean grad: 97.0 mmHg MR Vmax:      589.33 cm/s MR Vmean:     478.0 cm/s MV E velocity: 147.00 cm/s MV A velocity: 121.00 cm/s MV E/A ratio:  1.21 Fransico Him MD Electronically signed by Fransico Him MD Signature Date/Time: 01/29/2022/9:19:21 AM    Final    CT HEAD WO CONTRAST (5MM)  Result Date: 01/28/2022 CLINICAL DATA:  Acute neurological deficit.  Stroke suspected. EXAM: CT HEAD WITHOUT CONTRAST TECHNIQUE: Contiguous axial images were obtained from the base of the skull through the vertex without intravenous contrast. RADIATION DOSE REDUCTION: This exam was performed according to the departmental dose-optimization program which includes automated exposure control, adjustment of the mA and/or kV according to patient size and/or use of iterative reconstruction technique. COMPARISON:  None Available. FINDINGS: Brain: Note that patient received contrast material for a CTA chest earlier this evening, severely limiting the ability to detect intracranial hemorrhage. There is mild diffuse cerebral atrophy. Low-attenuation changes in the deep white matter consistent with small vessel ischemia. 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 There is a focal area of increased density in the right parietal lobe extending to the cortical surface. This is probably an area of contrast enhancement, possibly representing a venous malformation, but this could also represent an area of infarct or acute intraparenchymal hemorrhage. Enhancing tumor is less likely. Consider MRI versus repeat CT in 24 hours after contrast material has washed out. No mass effect or midline shift. No abnormal extra-axial fluid collections. Gray-white matter junctions are distinct. Vascular: Vascular calcifications.  No aneurysm identified. Skull: Calvarium appears intact. Sinuses/Orbits: Paranasal sinuses and mastoid air cells are  clear. Other: None. IMPRESSION: 1. Examination is technically limited due to residual IV contrast material from prior study. 2. Focal area of increased attenuation in the right parietal region. Differential diagnosis includes contrast enhancement in a AVM or less likely mass versus infarct or hemorrhage. Suggest MRI or repeat CT after contrast material has washed out. 3. Chronic atrophy and small vessel ischemic changes. Electronically Signed   By: Lucienne Capers M.D.   On: 01/28/2022 22:45   CT Angio Chest/Abd/Pel for Dissection W and/or Wo Contrast  Result Date: 01/28/2022 CLINICAL DATA:  Acute aortic syndrome (AAS) suspected. Shortness of breath. EXAM: CT ANGIOGRAPHY CHEST, ABDOMEN AND PELVIS TECHNIQUE: Non-contrast CT of the  chest was initially obtained. Multidetector CT imaging through the chest, abdomen and pelvis was performed using the standard protocol during bolus administration of intravenous contrast. Multiplanar reconstructed images and MIPs were obtained and reviewed to evaluate the vascular anatomy. RADIATION DOSE REDUCTION: This exam was performed according to the departmental dose-optimization program which includes automated exposure control, adjustment of the mA and/or kV according to patient size and/or use of iterative reconstruction technique. CONTRAST:  271m OMNIPAQUE IOHEXOL 350 MG/ML SOLN COMPARISON:  01/16/2022 FINDINGS: CTA CHEST FINDINGS Cardiovascular: Heart is normal size. Small to moderate pericardial effusion, stable since prior study. Extensive coronary artery calcifications. Moderate aortic calcifications. No evidence of aortic aneurysm or dissection. Mediastinum/Nodes: No mediastinal, hilar, or axillary adenopathy. Trachea and esophagus are unremarkable. Thyroid unremarkable. Lungs/Pleura: Small bilateral pleural effusions, new since prior study. Bibasilar atelectasis. Musculoskeletal: Left chest wall pacer noted. Chest wall soft tissues are unremarkable. No acute bony  abnormality. Review of the MIP images confirms the above findings. CTA ABDOMEN AND PELVIS FINDINGS VASCULAR Aorta: Diffuse aortic atherosclerosis. No evidence of aortic aneurysm or dissection. Celiac: Widely patent. SMA: Widely patent Renals: Single bilaterally, widely patent IMA: Widely patent Inflow: Atherosclerotic calcifications diffusely. No aneurysm or dissection. Veins: No obvious venous abnormality within the limitations of this arterial phase study. Review of the MIP images confirms the above findings. NON-VASCULAR Hepatobiliary: Prior cholecystectomy.  No focal hepatic abnormality. Pancreas: No focal abnormality or ductal dilatation. There is mild stranding seen adjacent to the pancreatic head and in the adjacent porta hepatis and adjacent to the descending duodenum. Also mild stranding adjacent to the pancreatic tail. This could reflect early acute pancreatitis. Recommend clinical correlation. Spleen: No focal abnormality.  Normal size. Adrenals/Urinary Tract: Adrenal glands unremarkable. Bilateral small renal cysts, appear benign. No follow-up imaging recommended. No hydronephrosis. Urinary bladder decompressed. Stomach/Bowel: Left colonic diverticulosis. No active diverticulitis. Stomach and small bowel decompressed, grossly unremarkable. Lymphatic: No adenopathy Reproductive: No visible focal abnormality. Other: No free fluid or free air. Bilateral inguinal hernias containing fat, left larger than right. Musculoskeletal: No acute bony abnormality. Review of the MIP images confirms the above findings. IMPRESSION: Aortic atherosclerosis. No evidence of aortic aneurysm or dissection. Small bilateral pleural effusions.  Bibasilar atelectasis. Mild stranding/edema noted adjacent to the pancreatic head and pancreatic tail raising the possibility of early acute pancreatitis. Recommend clinical correlation. Left colonic diverticulosis. Electronically Signed   By: KRolm BaptiseM.D.   On: 01/28/2022 20:31   DG  Chest Port 1 View  Result Date: 01/28/2022 CLINICAL DATA:  Presyncope EXAM: PORTABLE CHEST 1 VIEW COMPARISON:  None Available. FINDINGS: Mild bibasilar atelectasis or infiltrate. Small right pleural effusion is present. No pneumothorax. Cardiomegaly is stable. 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 Pulmonary vascularity is normal. No acute bone abnormality. IMPRESSION: 1. Mild bibasilar atelectasis or infiltrate. Small right pleural effusion. 2. Stable cardiomegaly. Electronically Signed   By: AFidela SalisburyM.D.   On: 01/28/2022 19:47   CUP PACEART INCLINIC DEVICE CHECK  Result Date: 01/28/2022 Wound check appointment. Steri-strips removed. Wound without redness or edema. Incision edges approximated, wound well healed. Normal device function. RA/RV sensing, and impedances consistent with implant measurements. Unable to run thresholds secondary to RVR. Burden 99.8% with current RVR episode ongoing from 1 day, 10 hours ago. Patient symptomatic complaining of inability to take a deep breath. +OAC. Dr. KCaryl Comesin to assess patient and patient added to Dr. KAquilla Hackerschedule today. Device programmed at 3.5V/auto capture programmed on for extra safety margin until 3 month visit.  Histogram distribution appropriate for patient and level of activity.    EKG EKG Interpretation  Date/Time:  Saturday February 21 2022 11:58:52 EDT Ventricular Rate:  87 PR Interval:  144 QRS Duration: 161 QT Interval:  284 QTC Calculation: 342 R Axis:   -13 Text Interpretation: Sinus rhythm Right bundle branch block Non-specific ST-t changes Confirmed by Lajean Saver [phone]) on 02/18/2022 12:54:23 PM  Radiology CT Head Wo Contrast  Result Date: 02/26/2022 CLINICAL DATA:  Head trauma, minor. EXAM: CT HEAD WITHOUT CONTRAST TECHNIQUE: Contiguous axial images were obtained from the base of the skull through the vertex without intravenous contrast. RADIATION DOSE REDUCTION: This exam was performed according to the  departmental dose-optimization program which includes automated exposure control, adjustment of the mA and/or kV according to patient size and/or use of iterative reconstruction technique. COMPARISON:  Prior head CT examinations 01/30/2022 and earlier. FINDINGS: Brain: Acute subdural hematoma overlying the left cerebral hemisphere measuring up to 16 mm in thickness. Associated mass effect with partial effacement of the left lateral ventricle and 14 mm rightward midline shift measured at the level of the septum pellucidum. No definite uncal herniation is appreciated at this time. Acute subdural hematoma overlying the right cerebral hemisphere, measuring up to 7 mm in thickness (for instance as seen on series 6, image 27). Thin acute subdural hemorrhage is also present along the posterior falx and left greater than right tentorium (measuring up to 4 mm in thickness). Moderate-volume subarachnoid hemorrhage overlying the left greater than right cerebral hemispheres and within the posterior fossa. Mild patchy and ill-defined hypoattenuation within the cerebral white matter, nonspecific but compatible chronic small vessel ischemic disease. 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 There are small foci of pneumocephalus along the left temporal lobe. No acute demarcated cortical infarct No evidence of an intracranial mass. Vascular: No hyperdense vessel.  Atherosclerotic calcifications. Skull: Nondisplaced acute fractures of the right occipital and temporal bones. A fracture lucency likely extends into the right mastoid air cells and courses along the right sigmoid sinus. The right occipital calvarial fracture extends to the foramen magnum. Additionally, small foci of pneumocephalus along the left temporal lobe suggest the presence of an occult left temporal bone fracture. Sinuses/Orbits: No mass or acute finding within the imaged orbits. Minimal mucosal  thickening within the bilateral ethmoid sinuses. These results were called by telephone at the time of interpretation on 02/04/2022 at 2:51 pm to provider [facility] , who verbally acknowledged these results. IMPRESSION: 1. Acute subdural hematoma overlying the left cerebral hemisphere, measuring up to 16 mm in thickness. Mass effect with partial effacement of the left lateral ventricle and 14 mm rightward midline shift. No definite uncal herniation is appreciated at this time. 2. Acute subdural hematoma overlying the right cerebral hemisphere, measuring up to 7 mm in thickness. 3. Thin acute subdural hemorrhage along the posterior falx and left greater than right tentorium. 4. Moderate-volume acute subarachnoid hemorrhage scattered along the bilateral cerebral hemispheres, and within the posterior fossa. 5. Acute, nondisplaced right occipital and temporal bone fractures, as described. The right temporal bone fracture courses in close proximity to the right sigmoid dural venous sinus. Consider CT venography of the head to ensure right sigmoid sinus patency. 6. Small foci of pneumocephalus along the left temporal lobe, suggesting the presence of an occult left temporal bone fracture. Electronically Signed   By: Kellie Simmering D.O.   On: 02/07/2022 14:57    Procedures Procedure Name:  Intubation Date/Time: 02/19/2022 4:02 PM  Performed by: Lajean Saver, MDPre-anesthesia Checklist: Patient identified, Patient being monitored, Emergency Drugs available, Timeout performed and Suction available Oxygen Delivery Method: Non-rebreather mask Preoxygenation: Pre-oxygenation with 100% oxygen Induction Type: Rapid sequence Ventilation: Mask ventilation without difficulty Grade View: Grade II Tube size: 7.5 mm Number of attempts: 1 Placement Confirmation: ETT inserted through vocal cords under direct vision, CO2 detector and Breath sounds checked- equal and bilateral Secured at: 24 cm Tube secured with: ETT  holder Dental Injury: Teeth and Oropharynx as per pre-operative assessment         Medications Ordered in ED Medications - No data to display  ED Course/ Medical Decision Making/ A&P                           Medical Decision Making Problems Addressed: Aortic stenosis, severe: chronic illness or injury with exacerbation, progression, or side effects of treatment that poses a threat to life or bodily functions Chronic anticoagulation: chronic illness or injury that poses a threat to life or bodily functions Closed skull fracture with concussion, initial encounter [facility]): acute illness or injury with systemic symptoms that poses a threat to life or bodily functions Fall from slip, trip, or stumble, initial encounter: acute illness or injury with systemic symptoms that poses a threat to life or bodily functions SAH (subarachnoid hemorrhage) ([place]): acute illness or injury with systemic symptoms that poses a threat to life or bodily functions Subdural hematoma ([place]): acute illness or injury with systemic symptoms that poses a threat to life or bodily functions  Amount and/or Complexity of Data Reviewed Independent Historian: EMS    Details: EMS, family, hx External Data Reviewed: notes. Labs: ordered. Decision-making details documented in ED Course. Radiology: ordered and independent interpretation performed. Decision-making details documented in ED Course. ECG/medicine tests: ordered and independent interpretation performed. Decision-making details documented in ED Course. Discussion of management or test interpretation with external provider(s): NS, trauma, icu team-  discussed pt.   Risk OTC drugs. Prescription drug management. Decision regarding hospitalization. Emergency major surgery.  Critical Care Total time providing critical care: 130 minutes   Iv ns. Continuous pulse ox and cardiac monitoring. Labs ordered/sent. Imaging ordered.   Reviewed nursing notes and prior  charts for additional history. External reports reviewed. Additional history from: EMS.   Upon initial eval of pt, stat ct ordered.   Cardiac monitor: sinus rhythm, rate 70.   Labs reviewed/interpreted by me - wbc and hgb normal.   Approached charge RN/RN re delay in CT - indicates short staff situation, RN is checked w ct as pt is trauma pt, anticoag therapy.   CT reviewed/interpreted by me - large SDH, sah, skull fx.   As soon as CT report called me, NS consulted, trauma consulted, eliquis reversal ordered, pharmacy consulted, and pt reassessed.   Recheck pt, much drowsier than earlier eval, easily aroused, pt respond verbally, but altered compared to initial eval.  Will intubate for airway protection/resp support given declining mental status.   Discussed with trauma surgery, Dr Barry Dienes - indicates she is with NS now, and Dr Zada Finders to see and take to OR.  Critical care consulted.   Propofol for sedation.  Pcxr.   CRITICAL CARE RE; fall/trauma activation with SDH/SAH/skull fx, altered ms/declining neuro exam, severe AS.  Performed by: Mirna Mires Total critical care time: 130 minutes Critical care time was exclusive of separately billable procedures and treating other patients. Critical care  was necessary to treat or prevent imminent or life-threatening deterioration. Critical care was time spent personally by me on the following activities: development of treatment plan with patient and/or surrogate as well as nursing, discussions with consultants, evaluation of patient's response to treatment, examination of patient, obtaining history from patient or surrogate, ordering and performing treatments and interventions, ordering and review of laboratory studies, ordering and review of radiographic studies, pulse oximetry and re-evaluation of patient's condition.  NS requests  1 gm/kg of mannitol - ordered/given, and pt will go to OR as soon as OR ready/able.           Final  Clinical Impression(s) / ED Diagnoses Final diagnoses:  None    Rx / DC Orders ED Discharge Orders     None           Lajean Saver, MD 02/28/2022 [phone]

## 2022-02-21 NOTE — Consult Note (Signed)
NAME:  Edward Butler, MRN:  [id number], DOB:  [birth year], LOS: 0 ADMISSION DATE:  02/14/2022, CONSULTATION DATE: 02/17/2022 REFERRING MD: Dr. 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  He has been dealing with episodic hypotension, required admission and pacemaker implant lead revision repair in June of this year followed by microperforation of the atrial lead requiring pericardiocentesis of a hemorrhagic pericardial effusion.  Admitted with this as well as labile blood pressure in the setting of his atrial fibrillation.  His apixaban was held until 02/07/2022. He apparently experienced a syncopal episode 7/22, fell and hit his head.  Brought to the emergency department with encephalopathy and required intubation for airway protection.  Head CT with acute left SDH with partial effacement of the left lateral ventricle, 14 mm rightward midline shift.  Also acute subdural hematoma overlying the right cerebral hemisphere 7 mm.  His apixaban was medically reversed.  He went to the OR for evacuation and drain placement 7/22.  Returns to the ICU on mechanical ventilation, sedation.  Pertinent  Medical History   Past Medical History:  Diagnosis Date   Aneurysm ([place])    Atrial fibrillation ([place])    CAP (community acquired pneumonia) 10/20/2021   Cardiac tamponade    Diabetes mellitus without complication ([place])    Hypertension    Non-recurrent acute suppurative otitis media of right ear without spontaneous rupture of tympanic membrane 07/04/2021   Stroke ([place])     Significant Hospital Events: Including procedures, antibiotic start and stop dates in addition to other pertinent events   7/22 urgent craniotomy SDH evacuation, drain placement  Interim History / Subjective:  Currently on propofol 30,  phenylephrine 40 (weaning) Just received paralytics prior to transfer from OR to ICU 1.00, PEEP 5  Objective   Blood pressure (!) 104/56, pulse 64, temperature (!) 97.4 F (36.3 C), temperature source Oral, resp. rate 17, height 5' 6"  (1.676 m), weight 68 kg, SpO2 98 %.    Vent Mode: PRVC FiO2 (%):  [40 %-100 %] (P) 40 % Set Rate:  [18 bmp] 18 bmp Vt Set:  [510 mL] 510 mL PEEP:  [5 cmH20] 5 cmH20 Plateau Pressure:  [13 cmH20] 13 cmH20   Intake/Output Summary (Last 24 hours) at 02/14/2022 2037 Last data filed at 02/04/2022 2022 Gross per 24 hour  Intake 1565 ml  Output 1850 ml  Net -285 ml   Filed Weights   02/18/2022 1139  Weight: 68 kg    Examination: General: Elderly ill-appearing man, sedated and paralyzed HENT: Craney incision intact, drain in place.  Pupils 3 mm, react bilaterally Lungs: Clear bilaterally  Cardiovascular: Irregularly irregular, distant, no murmur Abdomen: Nondistended, positive bowel sounds Extremities: Cool, no edema Neuro: Completely unresponsive (just received paralytic prior to transfer) GU: Foley catheter in place  Resolved Hospital Problem list     Assessment & Plan:  Acute traumatic subdural hematoma on anticoagulation, associated encephalopathy -Day 0 status post SDH evacuation and drain placement -Apixaban reversed in the ED, all anticoagulation on hold -Received single dose mannitol preoperatively -Repeat imaging as per neurosurgery plans -Blood pressure goal per NSGY, anticipate SBP < 160.  Currently on phenylephrine postoperatively but weaning.  Will adjust sedation.  Cleviprex is on his MAR but not currently running  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 -Sedation as per PAD protocol, propofol, fentanyl pushes as needed -PRVC 8 cc/kg, wean FiO2, PEEP  at 5 -Assess for wake up and SBT when underlying neurological issue stabilizing -Push pulmonary hygiene  Apparent syncopal episode in setting of Severe AS, recent hemorrhagic  pericardial effusion, known atrial fibrillation History of hypertension Hyperlipidemia -Okay to continue amiodarone -Anticoagulation on hold as above -Plan for repeat echocardiogram now to ensure no reaccumulation of pericardial fluid contributing to syncope -Hold diltiazem, furosemide at least initially.  Restart as we confirm hemodynamic stability post procedure -Restart simvastatin when able to do so -Would involve cardiology on 7/23, known to [facility] EP service  Diabetes mellitus. -Hold home metformin -Sliding-scale insulin as per protocol  Hypothyroidism -Continue home levothyroxine   Best Practice (right click and "Reselect all SmartList Selections" daily)   Diet/type: NPO DVT prophylaxis: SCD GI prophylaxis: PPI Lines: Arterial Line Foley:  Yes, and it is still needed Code Status:  full code Last date of multidisciplinary goals of care discussion [pending] Updated patient's 2 daughters at bedside on 7/22  Labs   CBC: Recent Labs  Lab 02/19/22 1052 02/22/2022 1146 02/26/2022 1716  WBC 7.9 9.7  --   NEUTROABS 5.8  --   --   HGB 16.4 15.1 11.6*  HCT 48.8 44.4 34.0*  MCV 90 88.8  --   PLT 229 189  --     Basic Metabolic Panel: Recent Labs  Lab 02/19/22 1052 02/26/2022 1146 02/15/2022 1716  NA 138 137 135  K 4.1 3.6 3.2*  CL 97 104  --   CO2 21 20*  --   GLUCOSE 230* 262*  --   BUN 24 20  --   CREATININE 1.20 1.07  --   CALCIUM 10.4* 9.6  --   MG 1.8  --   --    GFR: Estimated Creatinine Clearance: 48 mL/min (by C-G formula based on SCr of 1.07 mg/dL). Recent Labs  Lab 02/19/22 1052 02/03/2022 1146  WBC 7.9 9.7    Liver Function Tests: Recent Labs  Lab 02/19/22 1052 02/11/2022 1146  AST 23 32  ALT 24 23  ALKPHOS 73 53  BILITOT 0.6 1.1  PROT 7.4 6.8  ALBUMIN 4.7 3.9   No results for input(s): "LIPASE", "AMYLASE" in the last 168 hours. No results for input(s): "AMMONIA" in the last 168 hours.  ABG    Component Value Date/Time   PHART 7.363  02/24/2022 1716   PCO2ART 34.0 02/20/2022 1716   PO2ART 495 (H) 02/28/2022 1716   HCO3 19.5 (L) 02/13/2022 1716   TCO2 21 (L) 02/18/2022 1716   ACIDBASEDEF 5.0 (H) 02/11/2022 1716   O2SAT 100 02/08/2022 1716     Coagulation Profile: Recent Labs  Lab 03/02/2022 1505  INR 1.4*    Cardiac Enzymes: No results for input(s): "CKTOTAL", "CKMB", "CKMBINDEX", "TROPONINI" in the last 168 hours.  HbA1C: HbA1c, POC (controlled diabetic range)  Date/Time Value Ref Range Status  07/03/2021 11:58 AM 6.1 0.0 - 7.0 % Final  02/10/2021 11:25 AM 6.3 0.0 - 7.0 % Final   Hgb A1c MFr Bld  Date/Time Value Ref Range Status  02/19/2022 10:52 AM 6.5 (H) 4.8 - 5.6 % Final    Comment:             Prediabetes: 5.7 - 6.4          Diabetes: >6.4          Glycemic control for adults with diabetes: <7.0   02/01/2022 12:19 AM 5.9 (H) 4.8 - 5.6 % Final    Comment:    (NOTE) Pre diabetes:  5.7%-6.4%  Diabetes:              >6.4%  Glycemic control for   <7.0% adults with diabetes     CBG: No results for input(s): "GLUCAP" in the last 168 hours.  Review of Systems:   Unable to obtain  Past Medical History:  He,  has a past medical history of Aneurysm ([place]), Atrial fibrillation ([employer]), CAP (community acquired pneumonia) (10/20/2021), Cardiac tamponade, Diabetes mellitus without complication ([place]), Hypertension, Non-recurrent acute suppurative otitis media of right ear without spontaneous rupture of tympanic membrane (07/04/2021), and Stroke ([place]).   Surgical History:   Past Surgical History:  Procedure Laterality Date   LEAD REVISION/REPAIR N/A 01/16/2022   Procedure: LEAD REVISION/REPAIR;  Surgeon: Evans Lance, MD;  Location: [facility];  Service: Cardiovascular;  Laterality: N/A;   PACEMAKER IMPLANT N/A 01/14/2022   Procedure: PACEMAKER IMPLANT;  Surgeon: Deboraha Sprang, MD;  Location: [facility];  Service: Cardiovascular;  Laterality: N/A;   PERICARDIOCENTESIS N/A  01/30/2022   Procedure: PERICARDIOCENTESIS;  Surgeon: Burnell Blanks, MD;  Location: [place] [facility];  Service: Cardiovascular;  Laterality: N/A;   skin cancer surgeries        Social History:   reports that he has never smoked. He has never used smokeless tobacco. He reports that he does not currently use alcohol. He reports that he does not currently use drugs.   Family History:  His family history is not on file.   Allergies Allergies  Allergen Reactions   Metoprolol Other (See Comments)    Pt took two different times and both made him feel bad, the second time hypotensive. He prefers not to take     Home Medications  Prior to Admission medications   Medication Sig Start Date End Date Taking? Authorizing Provider  acetaminophen (TYLENOL) 500 MG tablet Take 1,000 mg by mouth daily as needed for moderate pain.   Yes [provider]  amiodarone (PACERONE) 200 MG tablet Take 2 tablets (400 mg total) by mouth 2 (two) times daily. X 1 week. Than two (2) tablets (400 mg) daily X 2 weeks. Than one(1) tablet by mouth (200 mg) daily. 02/10/22  Yes Baldwin Jamaica, PA-C  apixaban (ELIQUIS) 5 MG TABS tablet Take 1 tablet (5 mg total) by mouth 2 (two) times daily. 02/07/22  Yes Isaiah Serge, NP  Calcium Carbonate-Vitamin D (CALCIUM 600+D PO) Take 2 tablets by mouth daily.   Yes [provider]  cholecalciferol (VITAMIN D3) 25 MCG (1000 UNIT) tablet Take 1,000 Units by mouth daily.   Yes [provider]  diltiazem (CARDIZEM CD) 180 MG 24 hr capsule Take 1 capsule (180 mg total) by mouth daily. 02/05/22  Yes Isaiah Serge, NP  doxycycline (VIBRA-TABS) 100 MG tablet Take 1 tablet (100 mg total) by mouth 2 (two) times daily for 7 days. 02/19/22 02/26/22 Yes Elsie Stain, MD  furosemide (LASIX) 40 MG tablet Take 1 tablet (40 mg total) by mouth daily. 02/05/22  Yes Isaiah Serge, NP  levothyroxine (SYNTHROID) 25 MCG tablet Take 25 mcg by mouth daily before  breakfast. 12/03/18  Yes [provider]  magnesium oxide (MAG-OX) 400 MG tablet Take 400 mg by mouth daily. 12/13/18  Yes [provider]  metFORMIN (GLUCOPHAGE-XR) 500 MG 24 hr tablet Take 2 tablets (1,000 mg total) by mouth 2 (two) times daily. 09/19/21  Yes Elsie Stain, MD  simvastatin (ZOCOR) 10 MG tablet Take 10  mg by mouth daily. 09/28/18  Yes [provider]  Accu-Chek FastClix Lancets MISC Use to check blood sugar once daily. E11.9 02/26/21   Elsie Stain, MD  Blood Glucose Monitoring Suppl (ACCU-CHEK GUIDE ME) w/Device KIT USE AS DIRECTED 09/19/21   Elsie Stain, MD  glucose blood (ACCU-CHEK GUIDE) test strip USE TO CHECK BLOOD SUGAR ONCE DAILY. 09/19/21   Elsie Stain, MD     Critical care time: 59 min     Baltazar Apo, MD, PhD 02/22/2022, 9:22 PM [facility] [phone] or if no answer before 7:00PM call [phone] For any issues after 7:00PM please call eLink [phone]

## 2022-02-21 NOTE — Progress Notes (Signed)
ET Tube withdrawn to 24cm at the lip per MD verbal order.

## 2022-02-21 NOTE — Transfer of Care (Signed)
Immediate Anesthesia Transfer of Care Note  Patient: Edward Butler  Procedure(s) Performed: CRANIOTOMY FOR EVACUATION OF SUBDURAL HEMATOMA (Left: Head)  Patient Location: NICU  Anesthesia Type:General  Level of Consciousness: Patient remains intubated per anesthesia plan  Airway & Oxygen Therapy: Patient placed on Ventilator (see vital sign flow sheet for setting)  Post-op Assessment: Report given to RN and Post -op Vital signs reviewed and stable  Post vital signs: Reviewed and stable  Last Vitals:  Vitals Value Taken Time  BP 143/72 02/09/2022 2045  Temp    Pulse 63 02/18/2022 2054  Resp 18 02/13/2022 2055  SpO2 100 % 02/25/2022 2054  Vitals shown include unvalidated device data.  Last Pain:  Vitals:   02/09/2022 1447  TempSrc:   PainSc: 6          Complications: No notable events documented.

## 2022-02-21 NOTE — ED Notes (Signed)
Intubated with a 7.5, 24 @ lip

## 2022-02-21 NOTE — ED Notes (Signed)
EDP speaking to daughter at bedside.

## 2022-02-21 NOTE — Progress Notes (Signed)
eLink Physician-Brief Progress Note Patient Name: Edward Butler DOB: 12/10/1940 MRN: [id number]   Date of Service  02/24/2022  HPI/Events of Note  81 year old man with traumatic SDH now s/p OR. Intubated and now in ICU. On 100%,peep 5, RR 18, VT 510. Stable vital signs with HR 84. BP 140/70 and o2 sat is 99  eICU Interventions  PCCM consulted for post op care. Their note is pending. Neursurg note reviewed. Call E link if needed.      Intervention Category Major Interventions: Hemorrhage - evaluation and management;Respiratory failure - evaluation and management Evaluation Type: New Patient Evaluation  Margaretmary Lombard 02/05/2022, 9:01 PM

## 2022-02-21 NOTE — Anesthesia Preprocedure Evaluation (Addendum)
Anesthesia Evaluation  Patient identified by MRN, date of birth, ID band Patient unresponsive    Reviewed: Allergy & Precautions, Patient's Chart, lab work & pertinent test results, Unable to perform ROS - Chart review onlyPreop documentation limited or incomplete due to emergent nature of procedure.  Airway Mallampati: Intubated       Dental   Pulmonary sleep apnea ,     + decreased breath sounds      Cardiovascular hypertension, Pt. on medications +CHF   Rhythm:Regular Rate:Normal     Neuro/Psych CVA negative psych ROS   GI/Hepatic negative GI ROS, Neg liver ROS,   Endo/Other  diabetes, Type 2, Oral Hypoglycemic AgentsHypothyroidism   Renal/GU      Musculoskeletal negative musculoskeletal ROS (+)   Abdominal Normal abdominal exam  (+)   Peds  Hematology negative hematology ROS (+)   Anesthesia Other Findings   Reproductive/Obstetrics                            Anesthesia Physical Anesthesia Plan  ASA: 3 and emergent  Anesthesia Plan: General   Post-op Pain Management:    Induction: Inhalational  PONV Risk Score and Plan: 2 and Ondansetron and Treatment may vary due to age or medical condition  Airway Management Planned:   Additional Equipment: Arterial line  Intra-op Plan:   Post-operative Plan: Post-operative intubation/ventilation  Informed Consent:     History available from chart only and Only emergency history available  Plan Discussed with: CRNA  Anesthesia Plan Comments:       Anesthesia Quick Evaluation

## 2022-02-21 NOTE — ED Triage Notes (Signed)
Pt came in via [place] from home d/t a syncopla event that happened while he was outside. He fell from standing back onto concrete and has a small lac to the back of his head, bleeding controlled, on thinners, does have pacemaker. C/o dizziness while standing & lying, nystagmus nioted in eyes. A/Ox4, VSS.

## 2022-02-22 ENCOUNTER — Inpatient Hospital Stay (HOSPITAL_COMMUNITY): Payer: Medicare HMO

## 2022-02-22 DIAGNOSIS — R55 Syncope and collapse: Secondary | ICD-10-CM

## 2022-02-22 DIAGNOSIS — S065XAA Traumatic subdural hemorrhage with loss of consciousness status unknown, initial encounter: Secondary | ICD-10-CM | POA: Diagnosis not present

## 2022-02-22 LAB — TYPE AND SCREEN
ABO/RH(D): A POS
Antibody Screen: NEGATIVE
Unit division: 0

## 2022-02-22 LAB — GLUCOSE, CAPILLARY
Glucose-Capillary: 145 mg/dL — ABNORMAL HIGH (ref 70–99)
Glucose-Capillary: 151 mg/dL — ABNORMAL HIGH (ref 70–99)
Glucose-Capillary: 152 mg/dL — ABNORMAL HIGH (ref 70–99)
Glucose-Capillary: 225 mg/dL — ABNORMAL HIGH (ref 70–99)
Glucose-Capillary: 234 mg/dL — ABNORMAL HIGH (ref 70–99)
Glucose-Capillary: 262 mg/dL — ABNORMAL HIGH (ref 70–99)
Glucose-Capillary: 95 mg/dL (ref 70–99)

## 2022-02-22 LAB — POCT I-STAT 7, (LYTES, BLD GAS, ICA,H+H)
Acid-base deficit: 8 mmol/L — ABNORMAL HIGH (ref 0.0–2.0)
Bicarbonate: 18.2 mmol/L — ABNORMAL LOW (ref 20.0–28.0)
Calcium, Ion: 1.12 mmol/L — ABNORMAL LOW (ref 1.15–1.40)
HCT: 27 % — ABNORMAL LOW (ref 39.0–52.0)
Hemoglobin: 9.2 g/dL — ABNORMAL LOW (ref 13.0–17.0)
O2 Saturation: 99 %
Patient temperature: 33.9
Potassium: 3.4 mmol/L — ABNORMAL LOW (ref 3.5–5.1)
Sodium: 140 mmol/L (ref 135–145)
TCO2: 19 mmol/L — ABNORMAL LOW (ref 22–32)
pCO2 arterial: 32.2 mmHg (ref 32–48)
pH, Arterial: 7.344 — ABNORMAL LOW (ref 7.35–7.45)
pO2, Arterial: 162 mmHg — ABNORMAL HIGH (ref 83–108)

## 2022-02-22 LAB — BPAM RBC
Blood Product Expiration Date: 202308102359
ISSUE DATE / TIME: 202307221958
Unit Type and Rh: 6200

## 2022-02-22 LAB — TRIGLYCERIDES: Triglycerides: 74 mg/dL (ref <150)

## 2022-02-22 LAB — ECHOCARDIOGRAM COMPLETE
AR max vel: 0.8 cm2
AV Area VTI: 0.71 cm2
AV Area mean vel: 0.71 cm2
AV Mean grad: 45 mmHg
AV Peak grad: 74.4 mmHg
Ao pk vel: 4.31 m/s
Area-P 1/2: 2.44 cm2
Height: 66 in
MV VTI: 1.54 cm2
P 1/2 time: 259 msec
S' Lateral: 3.2 cm
Weight: 2400 oz

## 2022-02-22 LAB — BASIC METABOLIC PANEL
Anion gap: 7 (ref 5–15)
BUN: 14 mg/dL (ref 8–23)
CO2: 21 mmol/L — ABNORMAL LOW (ref 22–32)
Calcium: 8 mg/dL — ABNORMAL LOW (ref 8.9–10.3)
Chloride: 113 mmol/L — ABNORMAL HIGH (ref 98–111)
Creatinine, Ser: 0.85 mg/dL (ref 0.61–1.24)
GFR, Estimated: >60 mL/min (ref >60)
Glucose, Bld: 140 mg/dL — ABNORMAL HIGH (ref 70–99)
Potassium: 3.3 mmol/L — ABNORMAL LOW (ref 3.5–5.1)
Sodium: 141 mmol/L (ref 135–145)

## 2022-02-22 LAB — CBC
HCT: 29.6 % — ABNORMAL LOW (ref 39.0–52.0)
Hemoglobin: 10.3 g/dL — ABNORMAL LOW (ref 13.0–17.0)
MCH: 30.5 pg (ref 26.0–34.0)
MCHC: 34.8 g/dL (ref 30.0–36.0)
MCV: 87.6 fL (ref 80.0–100.0)
Platelets: 149 10*3/uL — ABNORMAL LOW (ref 150–400)
RBC: 3.38 MIL/uL — ABNORMAL LOW (ref 4.22–5.81)
RDW: 14.6 % (ref 11.5–15.5)
WBC: 8.9 10*3/uL (ref 4.0–10.5)
nRBC: 0 % (ref 0.0–0.2)

## 2022-02-22 LAB — PHOSPHORUS: Phosphorus: 3.4 mg/dL (ref 2.5–4.6)

## 2022-02-22 LAB — MAGNESIUM: Magnesium: 1.7 mg/dL (ref 1.7–2.4)

## 2022-02-22 MED ORDER — ORAL CARE MOUTH RINSE
15.0000 mL | OROMUCOSAL | Status: DC
  Administered 2022-02-22 – 2022-02-23 (×11): 15 mL via OROMUCOSAL

## 2022-02-22 MED ORDER — ORAL CARE MOUTH RINSE: 15.0000 mL | OROMUCOSAL | Status: DC | PRN

## 2022-02-22 MED ORDER — POTASSIUM CHLORIDE 10 MEQ/100ML IV SOLN
10.0000 meq | INTRAVENOUS | Status: AC
  Administered 2022-02-22 (×4): 10 meq via INTRAVENOUS
  Filled 2022-02-22 (×4): qty 100

## 2022-02-22 MED ORDER — OSMOLITE 1.2 CAL PO LIQD
1000.0000 mL | ORAL | Status: DC
  Administered 2022-02-22: 1000 mL

## 2022-02-22 MED ORDER — PERFLUTREN LIPID MICROSPHERE
1.0000 mL | INTRAVENOUS | Status: AC | PRN
  Administered 2022-02-22: 4 mL via INTRAVENOUS

## 2022-02-22 MED ORDER — ACETAMINOPHEN 500 MG PO TABS
1000.0000 mg | ORAL_TABLET | Freq: Every day | ORAL | Status: DC | PRN
  Administered 2022-02-22: 1000 mg

## 2022-02-22 MED ORDER — CHLORHEXIDINE GLUCONATE CLOTH 2 % EX PADS
6.0000 | MEDICATED_PAD | Freq: Every day | CUTANEOUS | Status: DC
Start: 2022-02-22 — End: 2022-02-24
  Administered 2022-02-23: 6 via TOPICAL

## 2022-02-22 MED ORDER — POTASSIUM CHLORIDE 20 MEQ PO PACK
20.0000 meq | PACK | ORAL | Status: AC
  Administered 2022-02-22 (×2): 20 meq
  Filled 2022-02-22 (×2): qty 1

## 2022-02-22 MED ORDER — MAGNESIUM SULFATE 2 GM/50ML IV SOLN
2.0000 g | Freq: Once | INTRAVENOUS | Status: AC
  Administered 2022-02-22: 2 g via INTRAVENOUS
  Filled 2022-02-22: qty 50

## 2022-02-22 MED ORDER — HYDRALAZINE HCL 20 MG/ML IJ SOLN
20.0000 mg | Freq: Four times a day (QID) | INTRAMUSCULAR | Status: DC | PRN
Start: 2022-02-22 — End: 2022-02-22

## 2022-02-22 MED ORDER — HYDRALAZINE HCL 20 MG/ML IJ SOLN
10.0000 mg | Freq: Four times a day (QID) | INTRAMUSCULAR | Status: DC | PRN
Start: 2022-02-22 — End: 2022-02-23
  Administered 2022-02-23: 10 mg via INTRAVENOUS
  Filled 2022-02-22: qty 1

## 2022-02-22 NOTE — Progress Notes (Signed)
RT transported patient to CT and back twice with two RN's. Pt was suctioned prior to transport both times.

## 2022-02-22 NOTE — Progress Notes (Signed)
Neurosurgery Service Progress Note  Subjective: No acute events overnight. Had recurrence of his L mydriasis overnight, otherwise uneventful   Objective: Vitals:   02/22/22 1043 02/22/22 1100 02/22/22 1115 02/22/22 1200  BP:  (!) 145/71    Pulse: 88 89 91   Resp: (!) 23 (!) 23 (!) 25   Temp:    100.3 F (37.9 C)  TempSrc:    Axillary  SpO2: 99% 96% 98%   Weight:      Height:        Physical Exam: Intubated, no sedation, L pupil fixed and dilated / non-reactive, right pinpoint and minimally, if at all reactive, mildly dysconjugate gaze, +corneal on R, minimal on the left, +c/g w/ extension, extends RUE > LUE to painful stim and minimally w/d the BLE with upgoing toes b/l, incision c/d/I with drain in place and bloody drainage  Assessment & Plan: 81 y.o. man on eliquis s/p syncope / fall from standing / TBI s/p reversal and OR for craniotomy and evacuation. Intra-op findings c/w with severe diffuse cortical injury and subdural bleeding from numerous locations. Preop L mydriasis, improved post-op and recurred, repeat CTH surprisingly shows good evacuation of the subdural without a large recurrence, but multiple large blossoming contusions on the left down to the basal ganglia and involving left receptive and motor speech regions with temporal contusion causing recurrent uncal herniation.  -CTH findings above, exam continues to be poor with unilateral fixed/dilated pupil and extension. Discussed with family, given the patient's age and above factors, I think there is no meaningful chance for functional recovery, which I explained to them. I offered to give them some time to process and inform family and we can re-discuss life sustaining measures accordingly.   Edward Butler  02/22/22 12:03 PM

## 2022-02-22 NOTE — TOC CAGE-AID Note (Signed)
CAGE-AID Screening   Patient Details  Name: Edward Butler MRN: [id number] Date of Birth: April 23, 1941  Contact:    Dia Crawford, RN Phone Number: 02/22/2022, 6:24 AM   Clinical Narrative:  Pt unable to participate in screening, pt is sedated and intubated.   CAGE-AID Screening: Substance Abuse Screening unable to be completed due to: : Patient unable to participate

## 2022-02-22 NOTE — ED Notes (Signed)
CT reports they came to unit for pt twice for scan, staff at bedside was busy with pt.

## 2022-02-22 NOTE — Progress Notes (Signed)
Trauma/Critical Care Follow Up Note  Subjective:    Overnight Issues:   Objective:  Vital signs for last 24 hours: Temp:  [97.4 F (36.3 C)-97.9 F (36.6 C)] 97.9 F (36.6 C) (07/23 0400) Pulse Rate:  [61-92] 81 (07/23 0744) Resp:  [11-32] 22 (07/23 0744) BP: (94-180)/(52-89) 139/56 (07/23 0744) SpO2:  [92 %-100 %] 100 % (07/23 0745) Arterial Line BP: (136-151)/(56-61) 136/56 (07/22 2300) FiO2 (%):  [40 %-100 %] 40 % (07/23 0745) Weight:  [68 kg] 68 kg (07/22 1139)  Hemodynamic parameters for last 24 hours:    Intake/Output from previous day: 07/22 0701 - 07/23 0700 In: 6578.9 [I.V.:2484.1; Blood:315; IV Piggyback:3779.8] Out: 3095 [Urine:2120; Drains:225; Blood:750]  Intake/Output this shift: No intake/output data recorded.  Vent settings for last 24 hours: Vent Mode: PRVC FiO2 (%):  [40 %-100 %] 40 % Set Rate:  [18 bmp] 18 bmp Vt Set:  [510 mL] 510 mL PEEP:  [5 cmH20] 5 cmH20 Plateau Pressure:  [9 cmH20-13 cmH20] 13 cmH20  Physical Exam:  Gen: comfortable, no distress Neuro: non-focal exam HEENT: PERRL Neck: supple CV: RRR Pulm: unlabored breathing Abd: soft, NT GU: clear yellow urine Extr: wwp, no edema   Results for orders placed or performed during the hospital encounter of 02/22/2022 (from the past 24 hour(s))  Urinalysis, Routine w reflex microscopic Urine, Clean Catch     Status: Abnormal   Collection Time: 02/06/2022 11:45 AM  Result Value Ref Range   Color, Urine STRAW (A) YELLOW   APPearance CLEAR CLEAR   Specific Gravity, Urine 1.008 1.005 - 1.030   pH 5.0 5.0 - 8.0   Glucose, UA 50 (A) NEGATIVE mg/dL   Hgb urine dipstick NEGATIVE NEGATIVE   Bilirubin Urine NEGATIVE NEGATIVE   Ketones, ur NEGATIVE NEGATIVE mg/dL   Protein, ur NEGATIVE NEGATIVE mg/dL   Nitrite NEGATIVE NEGATIVE   Leukocytes,Ua NEGATIVE NEGATIVE  CBC     Status: None   Collection Time: 02/26/2022 11:46 AM  Result Value Ref Range   WBC 9.7 4.0 - 10.5 K/uL   RBC 5.00 4.22 -  5.81 MIL/uL   Hemoglobin 15.1 13.0 - 17.0 g/dL   HCT 44.4 39.0 - 52.0 %   MCV 88.8 80.0 - 100.0 fL   MCH 30.2 26.0 - 34.0 pg   MCHC 34.0 30.0 - 36.0 g/dL   RDW 14.3 11.5 - 15.5 %   Platelets 189 150 - 400 K/uL   nRBC 0.0 0.0 - 0.2 %  Comprehensive metabolic panel     Status: Abnormal   Collection Time: 02/19/2022 11:46 AM  Result Value Ref Range   Sodium 137 135 - 145 mmol/L   Potassium 3.6 3.5 - 5.1 mmol/L   Chloride 104 98 - 111 mmol/L   CO2 20 (L) 22 - 32 mmol/L   Glucose, Bld 262 (H) 70 - 99 mg/dL   BUN 20 8 - 23 mg/dL   Creatinine, Ser 1.07 0.61 - 1.24 mg/dL   Calcium 9.6 8.9 - 10.3 mg/dL   Total Protein 6.8 6.5 - 8.1 g/dL   Albumin 3.9 3.5 - 5.0 g/dL   AST 32 15 - 41 U/L   ALT 23 0 - 44 U/L   Alkaline Phosphatase 53 38 - 126 U/L   Total Bilirubin 1.1 0.3 - 1.2 mg/dL   GFR, Estimated >60 >60 mL/min   Anion gap 13 5 - 15  ABO/Rh     Status: None   Collection Time: 01/31/2022 11:48 AM  Result Value Ref  Range   ABO/RH(D)      A POS Performed at [facility], [place] [address], [place], [place] [address]   Protime-INR     Status: Abnormal   Collection Time: 02/15/2022  3:05 PM  Result Value Ref Range   Prothrombin Time 17.4 (H) 11.4 - 15.2 seconds   INR 1.4 (H) 0.8 - 1.2  APTT     Status: None   Collection Time: 02/26/2022  3:05 PM  Result Value Ref Range   aPTT 31 24 - 36 seconds  Type and screen [place]     Status: None (Preliminary result)   Collection Time: 02/18/2022  3:05 PM  Result Value Ref Range   ABO/RH(D) A POS    Antibody Screen NEG    Sample Expiration 02/24/2022,2359    Unit Number [id number]    Blood Component Type RED CELLS,LR    Unit division 00    Status of Unit ISSUED    Transfusion Status OK TO TRANSFUSE    Crossmatch Result      Compatible Performed at [facility], [place] [address], [place],  [address]   I-Stat arterial blood gas, ED     Status: Abnormal   Collection Time: 02/14/2022  5:16 PM  Result Value  Ref Range   pH, Arterial 7.363 7.35 - 7.45   pCO2 arterial 34.0 32 - 48 mmHg   pO2, Arterial 495 (H) 83 - 108 mmHg   Bicarbonate 19.5 (L) 20.0 - 28.0 mmol/L   TCO2 21 (L) 22 - 32 mmol/L   O2 Saturation 100 %   Acid-base deficit 5.0 (H) 0.0 - 2.0 mmol/L   Sodium 135 135 - 145 mmol/L   Potassium 3.2 (L) 3.5 - 5.1 mmol/L   Calcium, Ion 1.15 1.15 - 1.40 mmol/L   HCT 34.0 (L) 39.0 - 52.0 %   Hemoglobin 11.6 (L) 13.0 - 17.0 g/dL   Patient temperature 97.4 F    Collection site RADIAL, ALLEN'S TEST ACCEPTABLE    Drawn by RT    Sample type ARTERIAL   Prepare RBC (crossmatch)     Status: None   Collection Time: 02/15/2022  8:06 PM  Result Value Ref Range   Order Confirmation      ORDER PROCESSED BY BLOOD BANK Performed at [facility], [address] [place], [place] [address]   I-STAT 7, (LYTES, BLD GAS, ICA, H+H)     Status: Abnormal   Collection Time: 02/14/2022  9:39 PM  Result Value Ref Range   pH, Arterial 7.366 7.35 - 7.45   pCO2 arterial 34.0 32 - 48 mmHg   pO2, Arterial 443 (H) 83 - 108 mmHg   Bicarbonate 19.5 (L) 20.0 - 28.0 mmol/L   TCO2 21 (L) 22 - 32 mmol/L   O2 Saturation 100 %   Acid-base deficit 5.0 (H) 0.0 - 2.0 mmol/L   Sodium 140 135 - 145 mmol/L   Potassium 3.6 3.5 - 5.1 mmol/L   Calcium, Ion 1.09 (L) 1.15 - 1.40 mmol/L   HCT 33.0 (L) 39.0 - 52.0 %   Hemoglobin 11.2 (L) 13.0 - 17.0 g/dL   Patient temperature 98.3 F    Sample type ARTERIAL   MRSA Next Gen by PCR, Nasal     Status: None   Collection Time: 02/11/2022  9:51 PM   Specimen: Nasal Mucosa; Nasal Swab  Result Value Ref Range   MRSA by PCR Next Gen NOT DETECTED NOT DETECTED  Glucose,  capillary     Status: Abnormal   Collection Time: 02/28/2022 10:01 PM  Result Value Ref Range   Glucose-Capillary 199 (H) 70 - 99 mg/dL  Glucose, capillary     Status: Abnormal   Collection Time: 02/22/22 12:44 AM  Result Value Ref Range   Glucose-Capillary 234 (H) 70 - 99 mg/dL  Glucose, capillary     Status:  Abnormal   Collection Time: 02/22/22  4:33 AM  Result Value Ref Range   Glucose-Capillary 152 (H) 70 - 99 mg/dL  Triglycerides     Status: None   Collection Time: 02/22/22  4:34 AM  Result Value Ref Range   Triglycerides 74 <150 mg/dL  CBC     Status: Abnormal   Collection Time: 02/22/22  4:34 AM  Result Value Ref Range   WBC 8.9 4.0 - 10.5 K/uL   RBC 3.38 (L) 4.22 - 5.81 MIL/uL   Hemoglobin 10.3 (L) 13.0 - 17.0 g/dL   HCT 29.6 (L) 39.0 - 52.0 %   MCV 87.6 80.0 - 100.0 fL   MCH 30.5 26.0 - 34.0 pg   MCHC 34.8 30.0 - 36.0 g/dL   RDW 14.6 11.5 - 15.5 %   Platelets 149 (L) 150 - 400 K/uL   nRBC 0.0 0.0 - 0.2 %  Basic metabolic panel     Status: Abnormal   Collection Time: 02/22/22  4:34 AM  Result Value Ref Range   Sodium 141 135 - 145 mmol/L   Potassium 3.3 (L) 3.5 - 5.1 mmol/L   Chloride 113 (H) 98 - 111 mmol/L   CO2 21 (L) 22 - 32 mmol/L   Glucose, Bld 140 (H) 70 - 99 mg/dL   BUN 14 8 - 23 mg/dL   Creatinine, Ser 0.85 0.61 - 1.24 mg/dL   Calcium 8.0 (L) 8.9 - 10.3 mg/dL   GFR, Estimated >60 >60 mL/min   Anion gap 7 5 - 15  Magnesium     Status: None   Collection Time: 02/22/22  4:34 AM  Result Value Ref Range   Magnesium 1.7 1.7 - 2.4 mg/dL  Phosphorus     Status: None   Collection Time: 02/22/22  4:34 AM  Result Value Ref Range   Phosphorus 3.4 2.5 - 4.6 mg/dL  Glucose, capillary     Status: None   Collection Time: 02/22/22  8:10 AM  Result Value Ref Range   Glucose-Capillary 95 70 - 99 mg/dL   Comment 1 Notify RN    Comment 2 Document in Chart     Assessment & Plan: The plan of care was discussed with the bedside nurse for the day, who is in agreement with this plan and no additional concerns were raised.   Present on Admission:  Subdural hematoma (HCC)    LOS: 1 day   Additional comments:I reviewed the patient's new clinical lab test results.   and I reviewed the patients new imaging test results.    GLF on Eliquis  SAH, B SDH - to OR emergently with  NSGY for L crani  R occipital bone fx extending into occipital condyle - CTA neck today Back pain on arrival - CT CAP today FEN - okay to start TF from trauma standpoint DVT - SCDs, LMWH per NSGY Dispo - ICU   Await imaging results  Jesusita Oka, MD Trauma & General Surgery Please use [email or web address] to contact on call provider  02/22/2022  *Care during the described time interval was provided by me. I have  reviewed this patient's available data, including medical history, events of note, physical examination and test results as part of my evaluation.

## 2022-02-22 NOTE — Progress Notes (Addendum)
eLink Physician-Brief Progress Note Patient Name: Edward Butler DOB: 16-May-1941 MRN: [id number]   Date of Service  02/22/2022  HPI/Events of Note  Received query regarding BP goals for this patient.   He is an 81/M with acute subdural hematoma on anticoagulation, s/p SDH evacuation and drain placement.  Apixaban reversed.   Current BP 132/65, HR 87, RR 21, O2 sats 100%.  eICU Interventions  Goal SBP should be <140.  Hydralazine PRN parameters changed.     Intervention Category Intermediate Interventions: Medication change / dose adjustment  [name] 02/22/2022, 10:46 PM

## 2022-02-22 NOTE — Progress Notes (Signed)
NAME:  Edward Butler, MRN:  [id number], DOB:  1940-10-19, LOS: 1 ADMISSION DATE:  02/28/2022, CONSULTATION DATE: 02/07/2022 REFERRING MD: Dr. 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  He has been dealing with episodic hypotension, required admission and pacemaker implant lead revision repair in June of this year followed by microperforation of the atrial lead requiring pericardiocentesis of a hemorrhagic pericardial effusion.  Admitted with this as well as labile blood pressure in the setting of his atrial fibrillation.  His apixaban was held until 02/07/2022. He apparently experienced a syncopal episode 7/22, fell and hit his head.  Brought to the emergency department with encephalopathy and required intubation for airway protection.  Head CT with acute left SDH with partial effacement of the left lateral ventricle, 14 mm rightward midline shift.  Also acute subdural hematoma overlying the right cerebral hemisphere 7 mm.  His apixaban was medically reversed.  He went to the OR for evacuation and drain placement 7/22.  Returns to the ICU on mechanical ventilation, sedation.  Pertinent  Medical History   Past Medical History:  Diagnosis Date   Aneurysm ([place])    Atrial fibrillation ([place])    CAP (community acquired pneumonia) 10/20/2021   Cardiac tamponade    Diabetes mellitus without complication ([place])    Hypertension    Non-recurrent acute suppurative otitis media of right ear without spontaneous rupture of tympanic membrane 07/04/2021   Stroke ([place])     Significant Hospital Events: Including procedures, antibiotic start and stop dates in addition to other pertinent events   7/22 urgent craniotomy SDH evacuation, drain placement  Interim History / Subjective:  On low-dose propofol.  Difficult  to get exam as he is sedated.  Normotensive.  No pressors.  Drain appears to be draining serosanguineous fluid.  Objective   Blood pressure (!) 145/71, pulse 91, temperature 98.2 F (36.8 C), temperature source Axillary, resp. rate (!) 25, height 5' 6"  (1.676 m), weight 68 kg, SpO2 98 %.    Vent Mode: PRVC FiO2 (%):  [30 %-100 %] 30 % Set Rate:  [18 bmp] 18 bmp Vt Set:  [510 mL] 510 mL PEEP:  [5 cmH20] 5 cmH20 Plateau Pressure:  [9 cmH20-13 cmH20] 13 cmH20   Intake/Output Summary (Last 24 hours) at 02/22/2022 1139 Last data filed at 02/22/2022 1100 Gross per 24 hour  Intake 6984.41 ml  Output 3095 ml  Net 3889.41 ml    Filed Weights   02/09/2022 1139  Weight: 68 kg    Examination: General: Elderly ill-appearing man, sedated  HENT: Craniotomy incision intact, drain in place.  Pupils 3 mm, react bilaterally Lungs: Clear bilaterally  Cardiovascular: Irregularly irregular, distant, no murmur Abdomen: Nondistended, positive bowel sounds Extremities: Cool, no edema Neuro: Does not follow commands, deeply sedated GU: Foley catheter in place  Resolved Hospital Problem list     Assessment & Plan:  Acute traumatic subdural hematoma on anticoagulation, associated encephalopathy -status post SDH evacuation and drain placement -Apixaban reversed in the ED, all anticoagulation and antiplatelets on hold -Received single dose mannitol preoperatively -Repeat imaging as per neurosurgery  -SBP goal less than 140, resume home diltiazem  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 -Hold home metformin -Sliding-scale insulin as per protocol  Hypothyroidism -Continue home levothyroxine   Best Practice (right click and "Reselect all SmartList Selections" daily)   Diet/type: tubefeeds DVT prophylaxis: SCD GI prophylaxis: PPI Lines: Arterial Line Foley:  Yes, and it is still needed Code Status:  full code Last date of multidisciplinary goals of care discussion [pending] Updated patient's 2 daughters at bedside on 7/23  Labs   CBC: Recent Labs  Lab 02/19/22 1052 02/04/2022 1146 02/15/2022 1716 02/20/2022 2139 02/22/22 0434  WBC 7.9 9.7  --   --  8.9  NEUTROABS 5.8  --   --   --   --   HGB 16.4 15.1 11.6* 11.2* 10.3*  HCT 48.8 44.4 34.0* 33.0* 29.6*  MCV 90 88.8  --   --  87.6  PLT 229 189  --   --  149*     Basic Metabolic Panel: Recent Labs  Lab 02/19/22 1052 03/01/2022 1146 02/05/2022 1716 02/04/2022 2139 02/22/22 0434  NA 138 137 135 140 141  K 4.1 3.6 3.2* 3.6 3.3*  CL 97 104  --   --  113*  CO2 21 20*  --   --  21*  GLUCOSE 230* 262*  --   --  140*  BUN 24 20  --   --  14  CREATININE 1.20 1.07  --   --  0.85  CALCIUM 10.4* 9.6  --   --  8.0*  MG 1.8  --   --   --  1.7  PHOS  --   --   --   --  3.4    GFR: Estimated Creatinine Clearance: 60.5 mL/min (by C-G formula based on SCr of 0.85 mg/dL). Recent Labs  Lab 02/19/22 1052 02/26/2022 1146 02/22/22 0434  WBC 7.9 9.7 8.9     Liver Function Tests: Recent Labs  Lab 02/19/22 1052 02/11/2022 1146  AST 23 32  ALT 24 23  ALKPHOS 73 53  BILITOT 0.6 1.1  PROT 7.4 6.8  ALBUMIN 4.7 3.9    No results for input(s): "LIPASE", "AMYLASE" in the last 168 hours. No results for input(s): "AMMONIA" in the last 168 hours.  ABG    Component Value Date/Time   PHART 7.366 02/19/2022 2139   PCO2ART 34.0 02/20/2022 2139   PO2ART 443 (H) 02/25/2022 2139   HCO3 19.5 (L) 02/17/2022 2139   TCO2 21 (L) 02/10/2022 2139   ACIDBASEDEF  5.0 (H) 02/11/2022 2139   O2SAT 100 02/25/2022 2139     Coagulation Profile: Recent Labs  Lab 02/04/2022 1505  INR 1.4*     Cardiac Enzymes: No results for input(s): "CKTOTAL", "CKMB", "CKMBINDEX", "TROPONINI" in the last 168 hours.  HbA1C: HbA1c, POC (controlled diabetic range)  Date/Time Value Ref Range Status  07/03/2021 11:58 AM 6.1 0.0 - 7.0 % Final  02/10/2021 11:25 AM 6.3 0.0 - 7.0 % Final   Hgb A1c MFr Bld  Date/Time Value Ref Range Status  02/19/2022 10:52 AM 6.5 (H) 4.8 - 5.6 % Final    Comment:             Prediabetes: 5.7 - 6.4          Diabetes: >6.4          Glycemic control for adults with diabetes: <  7.0   02/01/2022 12:19 AM 5.9 (H) 4.8 - 5.6 % Final    Comment:    (NOTE) Pre diabetes:          5.7%-6.4%  Diabetes:              >6.4%  Glycemic control for   <7.0% adults with diabetes     CBG: Recent Labs  Lab 02/11/2022 2201 02/22/22 0044 02/22/22 0433 02/22/22 0810 02/22/22 1133  GLUCAP 199* 234* 152* 95 151*    Review of Systems:   Unable to obtain  Past Medical History:  He,  has a past medical history of Aneurysm ([place]), Atrial fibrillation ([place]), CAP (community acquired pneumonia) (10/20/2021), Cardiac tamponade, Diabetes mellitus without complication ([place]), Hypertension, Non-recurrent acute suppurative otitis media of right ear without spontaneous rupture of tympanic membrane (07/04/2021), and Stroke ([place]).   Surgical History:   Past Surgical History:  Procedure Laterality Date   LEAD REVISION/REPAIR N/A 01/16/2022   Procedure: LEAD REVISION/REPAIR;  Surgeon: Evans Lance, MD;  Location: [facility];  Service: Cardiovascular;  Laterality: N/A;   PACEMAKER IMPLANT N/A 01/14/2022   Procedure: PACEMAKER IMPLANT;  Surgeon: Deboraha Sprang, MD;  Location: [facility];  Service: Cardiovascular;  Laterality: N/A;   PERICARDIOCENTESIS N/A 01/30/2022   Procedure: PERICARDIOCENTESIS;  Surgeon: Burnell Blanks, MD;   Location: [facility];  Service: Cardiovascular;  Laterality: N/A;   skin cancer surgeries        Social History:   reports that he has never smoked. He has never used smokeless tobacco. He reports that he does not currently use alcohol. He reports that he does not currently use drugs.   Family History:  His family history is not on file.   Allergies Allergies  Allergen Reactions   Metoprolol Other (See Comments)    Pt took two different times and both made him feel bad, the second time hypotensive. He prefers not to take     Home Medications  Prior to Admission medications   Medication Sig Start Date End Date Taking? Authorizing Provider  acetaminophen (TYLENOL) 500 MG tablet Take 1,000 mg by mouth daily as needed for moderate pain.   Yes [provider]  amiodarone (PACERONE) 200 MG tablet Take 2 tablets (400 mg total) by mouth 2 (two) times daily. X 1 week. Than two (2) tablets (400 mg) daily X 2 weeks. Than one(1) tablet by mouth (200 mg) daily. 02/10/22  Yes Baldwin [place], PA-C  apixaban (ELIQUIS) 5 MG TABS tablet Take 1 tablet (5 mg total) by mouth 2 (two) times daily. 02/07/22  Yes Isaiah Serge, NP  Calcium Carbonate-Vitamin D (CALCIUM 600+D PO) Take 2 tablets by mouth daily.   Yes [provider]  cholecalciferol (VITAMIN D3) 25 MCG (1000 UNIT) tablet Take 1,000 Units by mouth daily.   Yes [provider]  diltiazem (CARDIZEM CD) 180 MG 24 hr capsule Take 1 capsule (180 mg total) by mouth daily. 02/05/22  Yes Isaiah Serge, NP  doxycycline (VIBRA-TABS) 100 MG tablet Take 1 tablet (100 mg total) by mouth 2 (two) times daily for 7 days. 02/19/22 02/26/22 Yes Elsie Stain, MD  furosemide (LASIX) 40 MG tablet Take 1 tablet (40 mg total) by mouth daily. 02/05/22  Yes Isaiah Serge, NP  levothyroxine (SYNTHROID) 25 MCG tablet Take 25 mcg by mouth daily before breakfast. 12/03/18  Yes [provider]  magnesium oxide (MAG-OX) 400 MG tablet  Take 400  mg by mouth daily. 12/13/18  Yes [provider]  metFORMIN (GLUCOPHAGE-XR) 500 MG 24 hr tablet Take 2 tablets (1,000 mg total) by mouth 2 (two) times daily. 09/19/21  Yes Elsie Stain, MD  simvastatin (ZOCOR) 10 MG tablet Take 10 mg by mouth daily. 09/28/18  Yes [provider]  Accu-Chek FastClix Lancets MISC Use to check blood sugar once daily. E11.9 02/26/21   Elsie Stain, MD  Blood Glucose Monitoring Suppl (ACCU-CHEK GUIDE ME) w/Device KIT USE AS DIRECTED 09/19/21   Elsie Stain, MD  glucose blood (ACCU-CHEK GUIDE) test strip USE TO CHECK BLOOD SUGAR ONCE DAILY. 09/19/21   Elsie Stain, MD     Critical care time:    CRITICAL CARE Performed by: Lanier Clam   Total critical care time: 33 minutes  Critical care time was exclusive of separately billable procedures and treating other patients.  Critical care was necessary to treat or prevent imminent or life-threatening deterioration.  Critical care was time spent personally by me on the following activities: development of treatment plan with patient and/or surrogate as well as nursing, discussions with consultants, evaluation of patient's response to treatment, examination of patient, obtaining history from patient or surrogate, ordering and performing treatments and interventions, ordering and review of laboratory studies, ordering and review of radiographic studies, pulse oximetry and re-evaluation of patient's condition.   Lanier Clam, MD 02/22/2022, 11:39 AM [facility] See AMION for contact info or if no answer before 7:00PM call [phone] For any issues after 7:00PM please call eLink [phone]

## 2022-02-22 NOTE — Progress Notes (Signed)
[facility] ADULT ICU REPLACEMENT PROTOCOL   The patient does apply for the [facility] Adult ICU Electrolyte Replacment Protocol based on the criteria listed below:   1.Exclusion criteria: TCTS patients, ECMO patients, and Dialysis patients 2. Is GFR >/= 30 ml/min? yes Patient's GFR today is >60 3. Is SCr </= 2? yes Patient's SCr is 0.85 mg/dL 4. Did SCr increase >/= 0.5 in 24 hours? No. 5.Pt's weight >40kg  Yes.   6. Abnormal electrolyte(s): K, Mag  7. Electrolytes replaced per protocol 8.  Call MD STAT for K+ </= 2.5, Phos </= 1, or Mag </= 1 Physician:  [facility]i [facility] 02/22/2022 5:45 AM

## 2022-02-22 NOTE — Progress Notes (Signed)
Noted neuro exam by primary team. Defer CT CAP until further clarification of goals of care by family.   Jesusita Oka, MD General and [place] Surgery

## 2022-02-22 NOTE — Progress Notes (Signed)
Brief Nutrition Note  Consult received for enteral/tube feeding initiation and management.  Adult Enteral Nutrition Protocol initiated. Full assessment to follow.  OG tube in place with tip located in stomach per xray imaging.   Admitting Dx: Subdural hematoma ([place]) [S06.5XAA] SAH (subarachnoid hemorrhage) ([place]) [I60.9] Chronic anticoagulation [Z79.01] Aortic stenosis, severe [I35.0] Fall from slip, trip, or stumble, initial encounter [W01.0XXA] Closed skull fracture with concussion, initial encounter ([place]) [S02.91XA, S06.0XAA]  Body mass index is 24.21 kg/m. Pt meets criteria for normal weight based on current BMI.  Labs: Recent Labs  Lab 02/19/22 1052 02/09/2022 1146 02/26/2022 1716 02/22/2022 1952 02/14/2022 2139 02/22/22 0434  NA 138 137   < > 140 140 141  K 4.1 3.6   < > 3.4* 3.6 3.3*  CL 97 104  --   --   --  113*  CO2 21 20*  --   --   --  21*  BUN 24 20  --   --   --  14  CREATININE 1.20 1.07  --   --   --  0.85  CALCIUM 10.4* 9.6  --   --   --  8.0*  MG 1.8  --   --   --   --  1.7  PHOS  --   --   --   --   --  3.4  GLUCOSE 230* 262*  --   --   --  140*   < > = values in this interval not displayed.    Edward Butler, RD, LDN Clinical Dietitian RD pager # available in [place]  After hours/weekend pager # available in [facility]

## 2022-02-23 DIAGNOSIS — I350 Nonrheumatic aortic (valve) stenosis: Secondary | ICD-10-CM

## 2022-02-23 DIAGNOSIS — I609 Nontraumatic subarachnoid hemorrhage, unspecified: Secondary | ICD-10-CM

## 2022-02-23 DIAGNOSIS — S0291XA Unspecified fracture of skull, initial encounter for closed fracture: Secondary | ICD-10-CM | POA: Diagnosis not present

## 2022-02-23 DIAGNOSIS — S065XAA Traumatic subdural hemorrhage with loss of consciousness status unknown, initial encounter: Secondary | ICD-10-CM | POA: Diagnosis not present

## 2022-02-23 DIAGNOSIS — Z7901 Long term (current) use of anticoagulants: Secondary | ICD-10-CM | POA: Diagnosis not present

## 2022-02-23 DIAGNOSIS — S060XAA Concussion with loss of consciousness status unknown, initial encounter: Secondary | ICD-10-CM

## 2022-02-23 LAB — BASIC METABOLIC PANEL WITH GFR
Anion gap: 6 (ref 5–15)
BUN: 15 mg/dL (ref 8–23)
CO2: 21 mmol/L — ABNORMAL LOW (ref 22–32)
Calcium: 8.2 mg/dL — ABNORMAL LOW (ref 8.9–10.3)
Chloride: 113 mmol/L — ABNORMAL HIGH (ref 98–111)
Creatinine, Ser: 1.08 mg/dL (ref 0.61–1.24)
GFR, Estimated: >60 mL/min
Glucose, Bld: 257 mg/dL — ABNORMAL HIGH (ref 70–99)
Potassium: 4.6 mmol/L (ref 3.5–5.1)
Sodium: 140 mmol/L (ref 135–145)

## 2022-02-23 LAB — CBC
HCT: 30.5 % — ABNORMAL LOW (ref 39.0–52.0)
Hemoglobin: 10.4 g/dL — ABNORMAL LOW (ref 13.0–17.0)
MCH: 30.3 pg (ref 26.0–34.0)
MCHC: 34.1 g/dL (ref 30.0–36.0)
MCV: 88.9 fL (ref 80.0–100.0)
Platelets: 143 10*3/uL — ABNORMAL LOW (ref 150–400)
RBC: 3.43 MIL/uL — ABNORMAL LOW (ref 4.22–5.81)
RDW: 15.4 % (ref 11.5–15.5)
WBC: 10.2 10*3/uL (ref 4.0–10.5)
nRBC: 0 % (ref 0.0–0.2)

## 2022-02-23 LAB — GLUCOSE, CAPILLARY
Glucose-Capillary: 247 mg/dL — ABNORMAL HIGH (ref 70–99)
Glucose-Capillary: 230 mg/dL — ABNORMAL HIGH (ref 70–99)

## 2022-02-23 LAB — MAGNESIUM: Magnesium: 2.2 mg/dL (ref 1.7–2.4)

## 2022-02-23 MED ORDER — HALOPERIDOL 0.5 MG PO TABS: 0.5000 mg | ORAL_TABLET | ORAL | Status: DC | PRN

## 2022-02-23 MED ORDER — LABETALOL HCL 5 MG/ML IV SOLN
10.0000 mg | INTRAVENOUS | Status: DC | PRN
  Administered 2022-02-23: 10 mg via INTRAVENOUS
  Filled 2022-02-23: qty 4

## 2022-02-23 MED ORDER — GLYCOPYRROLATE 0.2 MG/ML IJ SOLN
0.2000 mg | INTRAMUSCULAR | Status: DC | PRN
  Administered 2022-02-23: 0.2 mg via INTRAVENOUS
  Filled 2022-02-23: qty 1

## 2022-02-23 MED ORDER — GLYCOPYRROLATE 0.2 MG/ML IJ SOLN: 0.2000 mg | INTRAMUSCULAR | Status: DC | PRN

## 2022-02-23 MED ORDER — ONDANSETRON HCL 4 MG/2ML IJ SOLN: 4.0000 mg | Freq: Four times a day (QID) | INTRAMUSCULAR | Status: DC | PRN

## 2022-02-23 MED ORDER — ACETAMINOPHEN 325 MG PO TABS
650.0000 mg | ORAL_TABLET | Freq: Every day | ORAL | Status: DC | PRN
  Administered 2022-02-23: 650 mg
  Filled 2022-02-23: qty 2

## 2022-02-23 MED ORDER — INSULIN ASPART 100 UNIT/ML IJ SOLN: 4.0000 [IU] | INTRAMUSCULAR | Status: DC

## 2022-02-23 MED ORDER — SODIUM CHLORIDE 0.9 % IV SOLN
2.0000 g | Freq: Four times a day (QID) | INTRAVENOUS | Status: DC
  Administered 2022-02-23: 2 g via INTRAVENOUS
  Filled 2022-02-23 (×4): qty 2000

## 2022-02-23 MED ORDER — HALOPERIDOL LACTATE 5 MG/ML IJ SOLN
0.5000 mg | INTRAMUSCULAR | Status: DC | PRN
  Administered 2022-02-23: 0.5 mg via INTRAVENOUS
  Filled 2022-02-23: qty 1

## 2022-02-23 MED ORDER — SODIUM CHLORIDE 0.9 % IV SOLN
2.0000 g | INTRAVENOUS | Status: DC
  Administered 2022-02-23 (×3): 2 g via INTRAVENOUS
  Filled 2022-02-23 (×4): qty 2000

## 2022-02-23 MED ORDER — HYDRALAZINE HCL 20 MG/ML IJ SOLN
10.0000 mg | INTRAMUSCULAR | Status: DC | PRN
Start: 2022-02-23 — End: 2022-02-24
  Administered 2022-02-23: 10 mg via INTRAVENOUS
  Filled 2022-02-23: qty 1

## 2022-02-23 MED ORDER — VANCOMYCIN HCL 750 MG/150ML IV SOLN
750.0000 mg | Freq: Two times a day (BID) | INTRAVENOUS | Status: DC
  Administered 2022-02-23: 750 mg via INTRAVENOUS
  Filled 2022-02-23 (×2): qty 150

## 2022-02-23 MED ORDER — ONDANSETRON 4 MG PO TBDP: 4.0000 mg | ORAL_TABLET | Freq: Four times a day (QID) | ORAL | Status: DC | PRN

## 2022-02-23 MED ORDER — DESMOPRESSIN ACETATE 4 MCG/ML IJ SOLN
2.0000 ug | Freq: Once | INTRAMUSCULAR | Status: AC
Start: 2022-02-23 — End: 2022-02-23
  Administered 2022-02-23: 2 ug via INTRAVENOUS
  Filled 2022-02-23: qty 1

## 2022-02-23 MED ORDER — ACETAMINOPHEN 650 MG RE SUPP: 650.0000 mg | Freq: Four times a day (QID) | RECTAL | Status: DC | PRN

## 2022-02-23 MED ORDER — ACETAMINOPHEN 325 MG PO TABS
650.0000 mg | ORAL_TABLET | Freq: Four times a day (QID) | ORAL | Status: DC | PRN
Start: 2022-02-23 — End: 2022-02-23
  Administered 2022-02-23: 650 mg
  Filled 2022-02-23: qty 2

## 2022-02-23 MED ORDER — GLYCOPYRROLATE 1 MG PO TABS: 1.0000 mg | ORAL_TABLET | ORAL | Status: DC | PRN

## 2022-02-23 MED ORDER — POLYVINYL ALCOHOL 1.4 % OP SOLN: 1.0000 [drp] | Freq: Four times a day (QID) | OPHTHALMIC | Status: DC | PRN

## 2022-02-23 MED ORDER — VANCOMYCIN HCL 1250 MG/250ML IV SOLN
1250.0000 mg | Freq: Once | INTRAVENOUS | Status: AC
  Administered 2022-02-23: 1250 mg via INTRAVENOUS
  Filled 2022-02-23: qty 250

## 2022-02-23 MED ORDER — BIOTENE DRY MOUTH MT LIQD: 15.0000 mL | OROMUCOSAL | Status: DC | PRN

## 2022-02-23 MED ORDER — HALOPERIDOL LACTATE 2 MG/ML PO CONC: 0.5000 mg | ORAL | Status: DC | PRN

## 2022-02-23 MED ORDER — INSULIN ASPART 100 UNIT/ML IJ SOLN: 0.0000 [IU] | INTRAMUSCULAR | Status: DC

## 2022-02-23 MED ORDER — MORPHINE 100MG IN NS 100ML (1MG/ML) PREMIX INFUSION
2.0000 mg/h | INTRAVENOUS | Status: DC
  Administered 2022-02-23: 2 mg/h via INTRAVENOUS
  Filled 2022-02-23: qty 100

## 2022-02-23 MED ORDER — ACETAMINOPHEN 325 MG PO TABS: 650.0000 mg | ORAL_TABLET | Freq: Four times a day (QID) | ORAL | Status: DC | PRN

## 2022-02-23 MED ORDER — CEFTRIAXONE SODIUM 2 G IJ SOLR
2.0000 g | Freq: Two times a day (BID) | INTRAMUSCULAR | Status: DC
  Administered 2022-02-23 (×2): 2 g via INTRAVENOUS
  Filled 2022-02-23 (×2): qty 20

## 2022-02-23 MED FILL — Thrombin For Soln 5000 Unit: CUTANEOUS | Qty: 5000 | Status: AC

## 2022-02-24 ENCOUNTER — Encounter (HOSPITAL_COMMUNITY): Payer: Self-pay | Admitting: Neurological Surgery

## 2022-02-24 ENCOUNTER — Encounter: Payer: Medicare HMO | Admitting: Internal Medicine

## 2022-02-25 LAB — CULTURE, RESPIRATORY W GRAM STAIN
Culture: NORMAL
Special Requests: NORMAL

## 2022-02-26 ENCOUNTER — Other Ambulatory Visit: Payer: Medicare HMO

## 2022-02-26 ENCOUNTER — Ambulatory Visit (HOSPITAL_COMMUNITY): Payer: Medicare HMO

## 2022-02-28 LAB — CULTURE, BLOOD (ROUTINE X 2)
Culture: NO GROWTH
Culture: NO GROWTH
Special Requests: ADEQUATE
Special Requests: ADEQUATE

## 2022-03-03 NOTE — Anesthesia Postprocedure Evaluation (Signed)
Anesthesia Post Note  Patient: Edward Butler  Procedure(s) Performed: CRANIOTOMY FOR EVACUATION OF SUBDURAL HEMATOMA (Left: Head)     Patient location during evaluation: SICU Anesthesia Type: General Level of consciousness: sedated Pain management: pain level controlled Vital Signs Assessment: post-procedure vital signs reviewed and stable Respiratory status: patient remains intubated per anesthesia plan Cardiovascular status: stable Postop Assessment: no apparent nausea or vomiting Anesthetic complications: no   No notable events documented.  Last Vitals:  Vitals:   Mar 17, 2022 0654 03/17/22 0700  BP:  (!) 102/54  Pulse: 85 75  Resp: (!) 21 (!) 21  Temp:    SpO2: 100% 100%    Last Pain:  Vitals:   March 17, 2022 0400  TempSrc: Axillary  PainSc:                  Effie Berkshire

## 2022-03-03 NOTE — Progress Notes (Signed)
Nutrition Brief Note  Chart reviewed. Dietitian Consult received for TF, Adult TF protocol initiated on 7/23 Pt now transitioning to comfort care.  No further nutrition interventions planned at this time.  Please re-consult as needed.   Kerman Passey MS, RDN, LDN, CNSC Registered Dietitian 3 Clinical Nutrition RD Pager and On-Call Pager Number Located in [place]

## 2022-03-03 NOTE — Progress Notes (Signed)
   Mar 16, 2022 0900  Urine Characteristics  Urinary Interventions Urine specific gravity  Urine Specific Gravity (!) 1.007   Patient began dumping urine after 0600. Notified Dr. Tacy Learn who requested bedside urine specific gravity. Orders to follow.

## 2022-03-03 NOTE — Progress Notes (Addendum)
eLink Physician-Brief Progress Note Patient Name: Edward Butler DOB: 1941/07/05 MRN: [id number]   Date of Service  2022/03/11  HPI/Events of Note  Notified of fever 100.55F.   Pt had febrile episodes throughout the day.  The patient is post evacuation and drain placement of SDH.   UA unremarkable.  CXR with no infiltrates.  Pt on 30% FiO2 and saturating adequately.   eICU Interventions  Obtain blood cultures.  Cover empirically with Vanc, ceftriaxone and ampicillin.      Intervention Category Intermediate Interventions: Infection - evaluation and management  [name] March 11, 2022, 12:53 AM  3:27 AM Notified of difficulty obtaining blood cultures.   Plan> Ok to stick the foot for blood culture.  Tylenol frequency changed to q6hrs PRN.

## 2022-03-03 NOTE — Progress Notes (Signed)
Pharmacy Antibiotic Note  Edward Butler is a 81 y.o. male admitted on 02/19/2022 with SDH/TBI, now s/p neuro surgery >> spiked fever overnight >> concern for meningitis.  Pharmacy has been consulted for vancomycin dosing.  Plan: Vancomycin '1250mg'$  x1 then '750mg'$  IV every 12 hours.  Goal trough 15-20 mcg/mL. Also started on ceftriaxone and ampicillin appropriately per MD.  Height: '5\' 6"'$  (167.6 cm) Weight: 68 kg (150 lb) IBW/kg (Calculated) : 63.8  Temp (24hrs), Avg:99.7 F (37.6 C), Min:97.9 F (36.6 C), Max:101 F (38.3 C)  Recent Labs  Lab 02/19/22 1052 02/27/2022 1146 02/22/22 0434  WBC 7.9 9.7 8.9  CREATININE 1.20 1.07 0.85    Estimated Creatinine Clearance: 60.5 mL/min (by C-G formula based on SCr of 0.85 mg/dL).    Allergies  Allergen Reactions   Metoprolol Other (See Comments)    Pt took two different times and both made him feel bad, the second time hypotensive. He prefers not to take    Thank you for allowing pharmacy to be a part of this patient's care.  Wynona Neat, PharmD, BCPS  03/12/22 1:05 AM

## 2022-03-03 NOTE — Progress Notes (Signed)
NAE ON, went into DI, got a dose of DDAVP, on my exam he's fixed and dilated, no corneals, no cough/gag, family aware and are pursuing comfort care.

## 2022-03-03 NOTE — Progress Notes (Signed)
NAME:  Edward Butler, MRN:  [id number], DOB:  1941-01-27, LOS: 2 ADMISSION DATE:  02/27/2022, CONSULTATION DATE: 02/27/2022 REFERRING MD: Dr. 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  He has been dealing with episodic hypotension, required admission and pacemaker implant lead revision repair in June of this year followed by microperforation of the atrial lead requiring pericardiocentesis of a hemorrhagic pericardial effusion.  Admitted with this as well as labile blood pressure in the setting of his atrial fibrillation.  His apixaban was held until 02/07/2022. He apparently experienced a syncopal episode 7/22, fell and hit his head.  Brought to the emergency department with encephalopathy and required intubation for airway protection.  Head CT with acute left SDH with partial effacement of the left lateral ventricle, 14 mm rightward midline shift.  Also acute subdural hematoma overlying the right cerebral hemisphere 7 mm.  His apixaban was medically reversed.  He went to the OR for evacuation and drain placement 7/22.  Returns to the ICU on mechanical ventilation, sedation.  Pertinent  Medical History   Past Medical History:  Diagnosis Date   Aneurysm ([place])    Atrial fibrillation ([place])    CAP (community acquired pneumonia) 10/20/2021   Cardiac tamponade    Diabetes mellitus without complication ([place])    Hypertension    Non-recurrent acute suppurative otitis media of right ear without spontaneous rupture of tympanic membrane 07/04/2021   Stroke ([place])     Significant Hospital Events: Including procedures, antibiotic start and stop dates in addition to other pertinent events   7/22 urgent craniotomy SDH evacuation, drain placement 02/22/2022 TTE >> EF 60-65%, mild asymmetric left ventricular  hypertrophy of the  septal segment, RV mildly enlarged, normal systolic function, Unable to assess PA pressure, LA mildly dilated, RA normal in size, Trivial pericardial effusion, MV degenerative in appearance , Severe  mitral annular calcification. Mild mitral valve regurgitation. Mild mitral  valve stenosis.MV peak gradient, 8.9 mmHg. The mean mitral valve gradient  is 4.0 mmHg with average heart rate of 85 bpm. TV normal with trivial regurgitation, Aortic Valve regurgitation moderate to severe, Severe aortic stenosis,   Interim History / Subjective:  Propofol has been off  since 02/22/2022 at 11 am.  Neurologically patient is not doing anything off sedation. He is weaning on CPAP/ PS  8/5 at present per vent.  Normotensive.  No pressors.  Drain is on the floor with bloody drainage. ? Order is for drain at floor level.  Pt. Is febrile. T Max last 24 hours 101, now 100.6. Concern for meningitis. Started on Vanc, Cefepime and Ampicillin 7/24 am.   Objective   Blood pressure (!) 102/54, pulse 75, temperature (!) 100.6 F (38.1 C), temperature source Axillary, resp. rate (!) 21, height 5' 6"  (1.676 m), weight 70.8 kg, SpO2 100 %.    Vent Mode: PRVC FiO2 (%):  [30 %-40 %] 40 % Set Rate:  [16 bmp-18 bmp] 16 bmp Vt Set:  [510 mL] 510 mL PEEP:  [5 cmH20] 5 cmH20 Plateau Pressure:  [12 cmH20-15 cmH20] 12 cmH20   Intake/Output Summary (Last 24 hours) at 03-12-22 0820 Last data filed at 03-12-2022 0700 Gross per 24 hour  Intake 1690.9 ml  Output 1025 ml  Net 665.9 ml   Filed Weights   02/24/2022 1139 2022/03/12 0500  Weight: 68 kg 70.8 kg    Examination: General: Elderly  ill-appearing man, unresponsive, no sedation  HENT: Craniotomy incision with staples  intact, drain in place on floor, bloody drainage,.  Pupils 3 mm on L and non-reactive , 1-2 mm on right and non-reactive, + gag with suction Lungs: Bilateral chest excursion, Coarse, diminished per bases Cardiovascular: SR-ST per tele, Loud  murmur Abdomen: Nondistended, positive bowel sounds, Body mass index is 25.19 kg/m. Extremities: No obvious deformities, Cool, no edema Neuro: Does not follow commands off sedation,some withdrawal to pain per lower extremities only  GU: Foley catheter in place  Labs reviewed 7/24 Na 140/ K 4.6/ Cl 113/ CO2 21/ Glucose 257/ Bun 15/ Creatinine 1.08 from 0.85/ Calcium 8.2/Mag 2.2 WBC 10/ HGB 10.4/ Platelets 143 Blood Cultures have been drawn 7/24  Resolved Hospital Problem list     Assessment & Plan:  Acute traumatic subdural hematoma on anticoagulation, associated encephalopathy -status post SDH evacuation and drain placement -Apixaban reversed in the ED, all anticoagulation and antiplatelets on hold -Received single dose mannitol preoperatively -Repeat imaging as per neurosurgery  -SBP goal less than 140, resume home diltiazem  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 -Propofol off ,  fentanyl pushes prn -PRVC 8 cc/kg, wean FiO2, PEEP at 5 -Daily SBT, mental status currently precludes extubation - CXR in am and prn  Fever T Max 101 Neuro vs other source Slight bump in WBC to 10.2 Plan Vanc/ Cefepime/ Ampicillin to cover for meningitis Follow blood Cultures drawn 7/23 Sputum for Culture Trend fever curve and WBC  Apparent syncopal episode in setting of Severe AS, recent hemorrhagic pericardial effusion, known atrial fibrillation History of hypertension Hyperlipidemia -Anticoagulation on hold as above -Repeat TTE>> results noted above -Resume diltiazem -Resume statin -Would involve cardiology on 7/23, known to [facility] EP service  Diabetes mellitus. CBG's greater than 200 consistently -Hold home metformin -Sliding-scale insulin as per protocol - Added TF coverage 4 units every 4 hours  Hypothyroidism -Continue home levothyroxine  Goals of Care Plan Will need  ongoing goals of care conversations with family, as patient remains a full code and prognosis is poor.     Best Practice (right click and "Reselect all SmartList Selections" daily)   Diet/type: tubefeeds DVT prophylaxis: SCD GI prophylaxis: PPI Lines: Arterial Line Foley:  Yes, and it is still needed Code Status:  full code Last date of multidisciplinary goals of care discussion [pending] Updated patient's 2 daughters at bedside on 7/23  Labs   CBC: Recent Labs  Lab 02/19/22 1052 02/06/2022 1146 02/07/2022 1146 02/03/2022 1716 02/25/2022 1952 03/01/2022 2139 02/22/22 0434 03-01-2022 0335  WBC 7.9 9.7  --   --   --   --  8.9 10.2  NEUTROABS 5.8  --   --   --   --   --   --   --   HGB 16.4 15.1   < > 11.6* 9.2* 11.2* 10.3* 10.4*  HCT 48.8 44.4   < > 34.0* 27.0* 33.0* 29.6* 30.5*  MCV 90 88.8  --   --   --   --  87.6 88.9  PLT 229 189  --   --   --   --  149* 143*   < > = values in this interval not displayed.    Basic Metabolic Panel: Recent Labs  Lab 02/19/22 1052 02/19/22 1052 02/04/2022 1146 02/08/2022 1716 02/01/2022 1952 02/27/2022 2139 02/22/22 0434 03/01/2022 0335  NA 138   < > 137 135 140 140 141 140  K 4.1  --  3.6 3.2* 3.4* 3.6  3.3* 4.6  CL 97  --  104  --   --   --  113* 113*  CO2 21  --  20*  --   --   --  21* 21*  GLUCOSE 230*  --  262*  --   --   --  140* 257*  BUN 24  --  20  --   --   --  14 15  CREATININE 1.20  --  1.07  --   --   --  0.85 1.08  CALCIUM 10.4*  --  9.6  --   --   --  8.0* 8.2*  MG 1.8  --   --   --   --   --  1.7 2.2  PHOS  --   --   --   --   --   --  3.4  --    < > = values in this interval not displayed.   GFR: Estimated Creatinine Clearance: 47.6 mL/min (by C-G formula based on SCr of 1.08 mg/dL). Recent Labs  Lab 02/19/22 1052 03/02/2022 1146 02/22/22 0434 11-Mar-2022 0335  WBC 7.9 9.7 8.9 10.2    Liver Function Tests: Recent Labs  Lab 02/19/22 1052 02/04/2022 1146  AST 23 32  ALT 24 23  ALKPHOS 73 53  BILITOT 0.6 1.1  PROT 7.4 6.8  ALBUMIN 4.7 3.9   No results for input(s): "LIPASE", "AMYLASE" in the last 168 hours. No results  for input(s): "AMMONIA" in the last 168 hours.  ABG    Component Value Date/Time   PHART 7.366 02/20/2022 2139   PCO2ART 34.0 03/02/2022 2139   PO2ART 443 (H) 02/27/2022 2139   HCO3 19.5 (L) 02/15/2022 2139   TCO2 21 (L) 02/22/2022 2139   ACIDBASEDEF 5.0 (H) 02/24/2022 2139   O2SAT 100 02/18/2022 2139     Coagulation Profile: Recent Labs  Lab 02/07/2022 1505  INR 1.4*    Cardiac Enzymes: No results for input(s): "CKTOTAL", "CKMB", "CKMBINDEX", "TROPONINI" in the last 168 hours.  HbA1C: HbA1c, POC (controlled diabetic range)  Date/Time Value Ref Range Status  07/03/2021 11:58 AM 6.1 0.0 - 7.0 % Final  02/10/2021 11:25 AM 6.3 0.0 - 7.0 % Final   Hgb A1c MFr Bld  Date/Time Value Ref Range Status  02/19/2022 10:52 AM 6.5 (H) 4.8 - 5.6 % Final    Comment:             Prediabetes: 5.7 - 6.4          Diabetes: >6.4          Glycemic control for adults with diabetes: <7.0   02/01/2022 12:19 AM 5.9 (H) 4.8 - 5.6 % Final    Comment:    (NOTE) Pre diabetes:          5.7%-6.4%  Diabetes:              >6.4%  Glycemic control for   <7.0% adults with diabetes     CBG: Recent Labs  Lab 02/22/22 1544 02/22/22 1945 02/22/22 2325 Mar 11, 2022 0320 03/11/2022 0734  GLUCAP 145* 225* 262* 247* 230*   Allergies Allergies  Allergen Reactions   Metoprolol Other (See Comments)    Pt took two different times and both made him feel bad, the second time hypotensive. He prefers not to take     Home Medications  Prior to Admission medications   Medication Sig Start Date End Date Taking? Authorizing Provider  acetaminophen (TYLENOL) 500 MG tablet  Take 1,000 mg by mouth daily as needed for moderate pain.   Yes [provider]  amiodarone (PACERONE) 200 MG tablet Take 2 tablets (400 mg total) by mouth 2 (two) times daily. X 1 week. Than two (2) tablets (400 mg) daily X 2 weeks. Than one(1) tablet by mouth (200 mg) daily. 02/10/22  Yes Baldwin Jamaica, PA-C  apixaban (ELIQUIS)  5 MG TABS tablet Take 1 tablet (5 mg total) by mouth 2 (two) times daily. 02/07/22  Yes Isaiah Serge, NP  Calcium Carbonate-Vitamin D (CALCIUM 600+D PO) Take 2 tablets by mouth daily.   Yes [provider]  cholecalciferol (VITAMIN D3) 25 MCG (1000 UNIT) tablet Take 1,000 Units by mouth daily.   Yes [provider]  diltiazem (CARDIZEM CD) 180 MG 24 hr capsule Take 1 capsule (180 mg total) by mouth daily. 02/05/22  Yes Isaiah Serge, NP  doxycycline (VIBRA-TABS) 100 MG tablet Take 1 tablet (100 mg total) by mouth 2 (two) times daily for 7 days. 02/19/22 02/26/22 Yes Elsie Stain, MD  furosemide (LASIX) 40 MG tablet Take 1 tablet (40 mg total) by mouth daily. 02/05/22  Yes Isaiah Serge, NP  levothyroxine (SYNTHROID) 25 MCG tablet Take 25 mcg by mouth daily before breakfast. 12/03/18  Yes [provider]  magnesium oxide (MAG-OX) 400 MG tablet Take 400 mg by mouth daily. 12/13/18  Yes [provider]  metFORMIN (GLUCOPHAGE-XR) 500 MG 24 hr tablet Take 2 tablets (1,000 mg total) by mouth 2 (two) times daily. 09/19/21  Yes Elsie Stain, MD  simvastatin (ZOCOR) 10 MG tablet Take 10 mg by mouth daily. 09/28/18  Yes [provider]  Accu-Chek FastClix Lancets MISC Use to check blood sugar once daily. E11.9 02/26/21   Elsie Stain, MD  Blood Glucose Monitoring Suppl (ACCU-CHEK GUIDE ME) w/Device KIT USE AS DIRECTED 09/19/21   Elsie Stain, MD  glucose blood (ACCU-CHEK GUIDE) test strip USE TO CHECK BLOOD SUGAR ONCE DAILY. 09/19/21   Elsie Stain, MD     Critical care time:    CRITICAL CARE Performed by: Magdalen Spatz   Total critical care time: 40 minutes  Critical care time was exclusive of separately billable procedures and treating other patients.  Critical care was necessary to treat or prevent imminent or life-threatening deterioration.  Critical care was time spent personally by me on the following activities: development of  treatment plan with patient and/or surrogate as well as nursing, discussions with consultants, evaluation of patient's response to treatment, examination of patient, obtaining history from patient or surrogate, ordering and performing treatments and interventions, ordering and review of laboratory studies, ordering and review of radiographic studies, pulse oximetry and re-evaluation of patient's condition.   Magdalen Spatz, NP 2022-03-22, 8:20 AM [facility] See AMION for contact info or if no answer before 7:00PM call [phone] For any issues after 7:00PM please call eLink [phone]

## 2022-03-03 NOTE — Discharge Summary (Signed)
Discharge Summary  Date of Admission: 02/26/2022  Date of Discharge: 2022-03-18  Attending Physician: Emelda Brothers, MD  Hospital Course: Patient presented to the ED after a fall from a suspected syncopal episode. He had progressive obtundation and was intubated in the ED, where a CTH showed a large acute left subdural hematoma with a smaller one on the left as well as right temporal / occipital skull fracture. They were taken to the OR for a left craniotomy. The subdural was evacuated but there was diffuse cerebral swelling and congestion concerning for a larger underlying injury. Post-op, CTH confirmed this suspicion, with large contusions in the left temporal and right frontal lobe. His neurologic exam remained poor, his family agreed to terminal extubation, which occurred on 7/24 and the patient was declared dead.  Discharge diagnosis: Traumatic brain injury, subdural hematoma  Judith Part, MD 02/25/22 11:03 AM

## 2022-03-03 NOTE — IPAL (Signed)
Interdisciplinary Goals of Care Family Meeting   Date carried out:: 03-10-22  Location of the meeting: Bedside  Member's involved: Physician, Bedside Registered Nurse, and Family Member or next of kin  Durable Power of Attorney or acting medical decision maker: Amy Fransisca Kaufmann  Discussion: We discussed goals of care for [employer] .    The Clinical status was relayed to daughter at bedside in detail.   Updated and notified of patients medical condition.     Patient remains unresponsive and will not open eyes to command.   Patient is having a weak cough and struggling to remove secretions.   Signs of extensive brain damage   Patient with extensive brain damage with a very high probablity of a very minimal chance of meaningful recovery despite all aggressive and optimal medical therapy.  Code status: Full DNR  Disposition: Continue current acute care for now, until all family members come and say goodbye before proceeding to palliative extubation with comfort care    Family are satisfied with Plan of action and management. All questions answered   Jacky Kindle MD [facility] See Amion for pager If no response to pager, please call [phone] until 7pm After 7pm, Please call E-link [phone]

## 2022-03-03 NOTE — Progress Notes (Addendum)
eLink Physician-Brief Progress Note Patient Name: Edward Butler DOB: 30-Sep-1940 MRN: [id number]   Date of Service  03-16-22  HPI/Events of Note  Notified of BP 144/60 and hydralazine is not due until an hour from now.   eICU Interventions  Change hydralazine frequency to q4hrs PRN.      Intervention Category Intermediate Interventions: Hypertension - evaluation and management  [name] 03/16/2022, 5:23 AM  6:45 AM SBP remains >140.  Arterial line BP at 148/59, HR 84.  Plan> Give labetalol PRN.   Pt listed allergy to metoprolol but does not seem to be real allergy (made him feel bad and hypotension).

## 2022-03-03 NOTE — Progress Notes (Signed)
RT NOTE: Patient extubated to comfort care per order and family wishes.

## 2022-03-03 DEATH — deceased

## 2022-03-07 ENCOUNTER — Other Ambulatory Visit: Payer: Self-pay | Admitting: Critical Care Medicine

## 2022-03-09 ENCOUNTER — Ambulatory Visit: Payer: Medicare HMO | Admitting: Internal Medicine

## 2022-03-11 ENCOUNTER — Ambulatory Visit: Payer: Medicare HMO | Admitting: Cardiovascular Disease

## 2022-04-23 ENCOUNTER — Ambulatory Visit: Payer: Medicare HMO | Admitting: Critical Care Medicine

## 2022-04-28 ENCOUNTER — Encounter: Payer: Medicare HMO | Admitting: Internal Medicine

## 2022-05-22 IMAGING — DX DG CHEST 1V PORT
1 series · 1 of 1 positions shown · non-contrast
Comparison: 09/04/2020

CLINICAL DATA: Cough and tachycardia.

EXAM:
PORTABLE CHEST 1 VIEW

[chest ap]
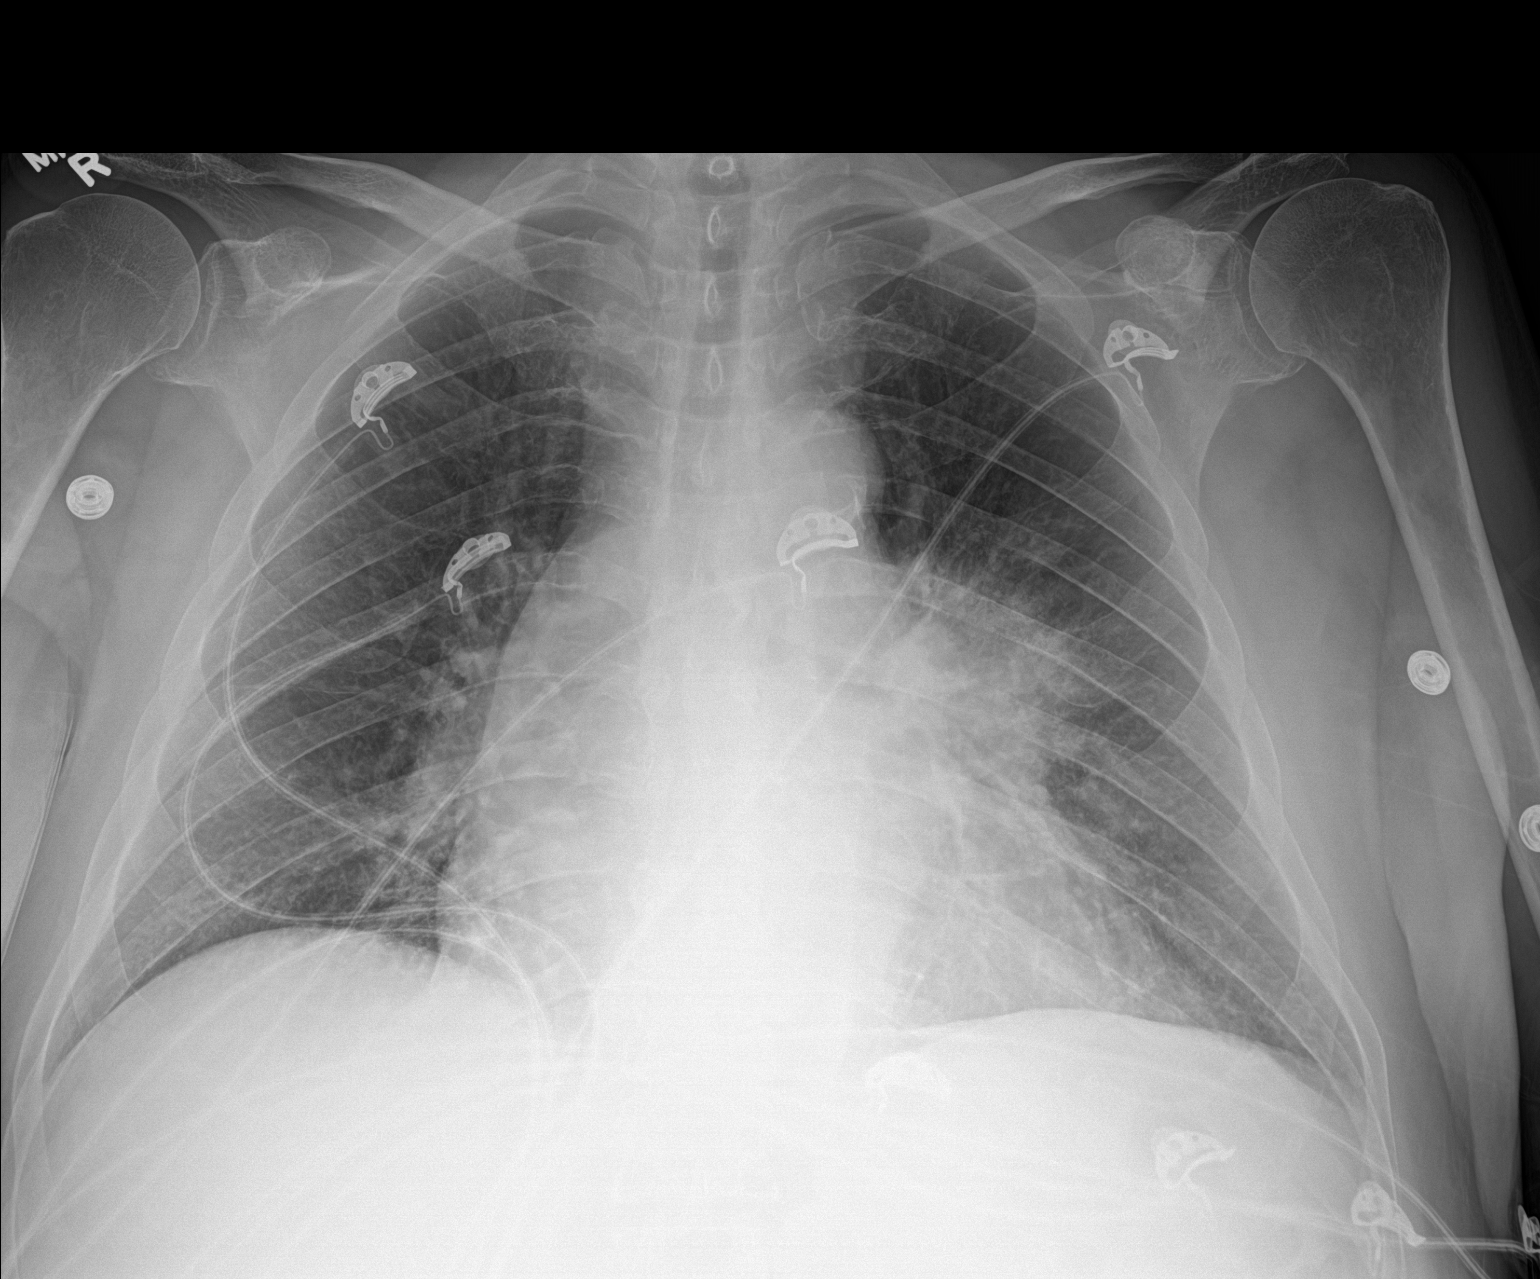

[1 of 1 positions shown; findings below may reference images not displayed]

FINDINGS: Midline trachea. Mild cardiomegaly. Left and possible right
perihilar increased density. No pleural effusion or pneumothorax.
IMPRESSION: Left and possible right perihilar increased density. This could
represent asymmetric pulmonary edema or left-sided pneumonia.
However, an underlying left hilar mass or adenopathy is a concern.
If the patient has symptoms which could be attributed to pulmonary
edema or pneumonia, appropriate therapy and short-term radiographic
follow-up could be performed. If not, recommend further evaluation
with contrast enhanced chest CT.

## 2022-05-23 IMAGING — CR DG CHEST 2V
2 series · 2 of 2 positions shown · non-contrast
Comparison: 10/20/2021

CLINICAL DATA: Fever, cough

EXAM:
CHEST - 2 VIEW

[chest pa]
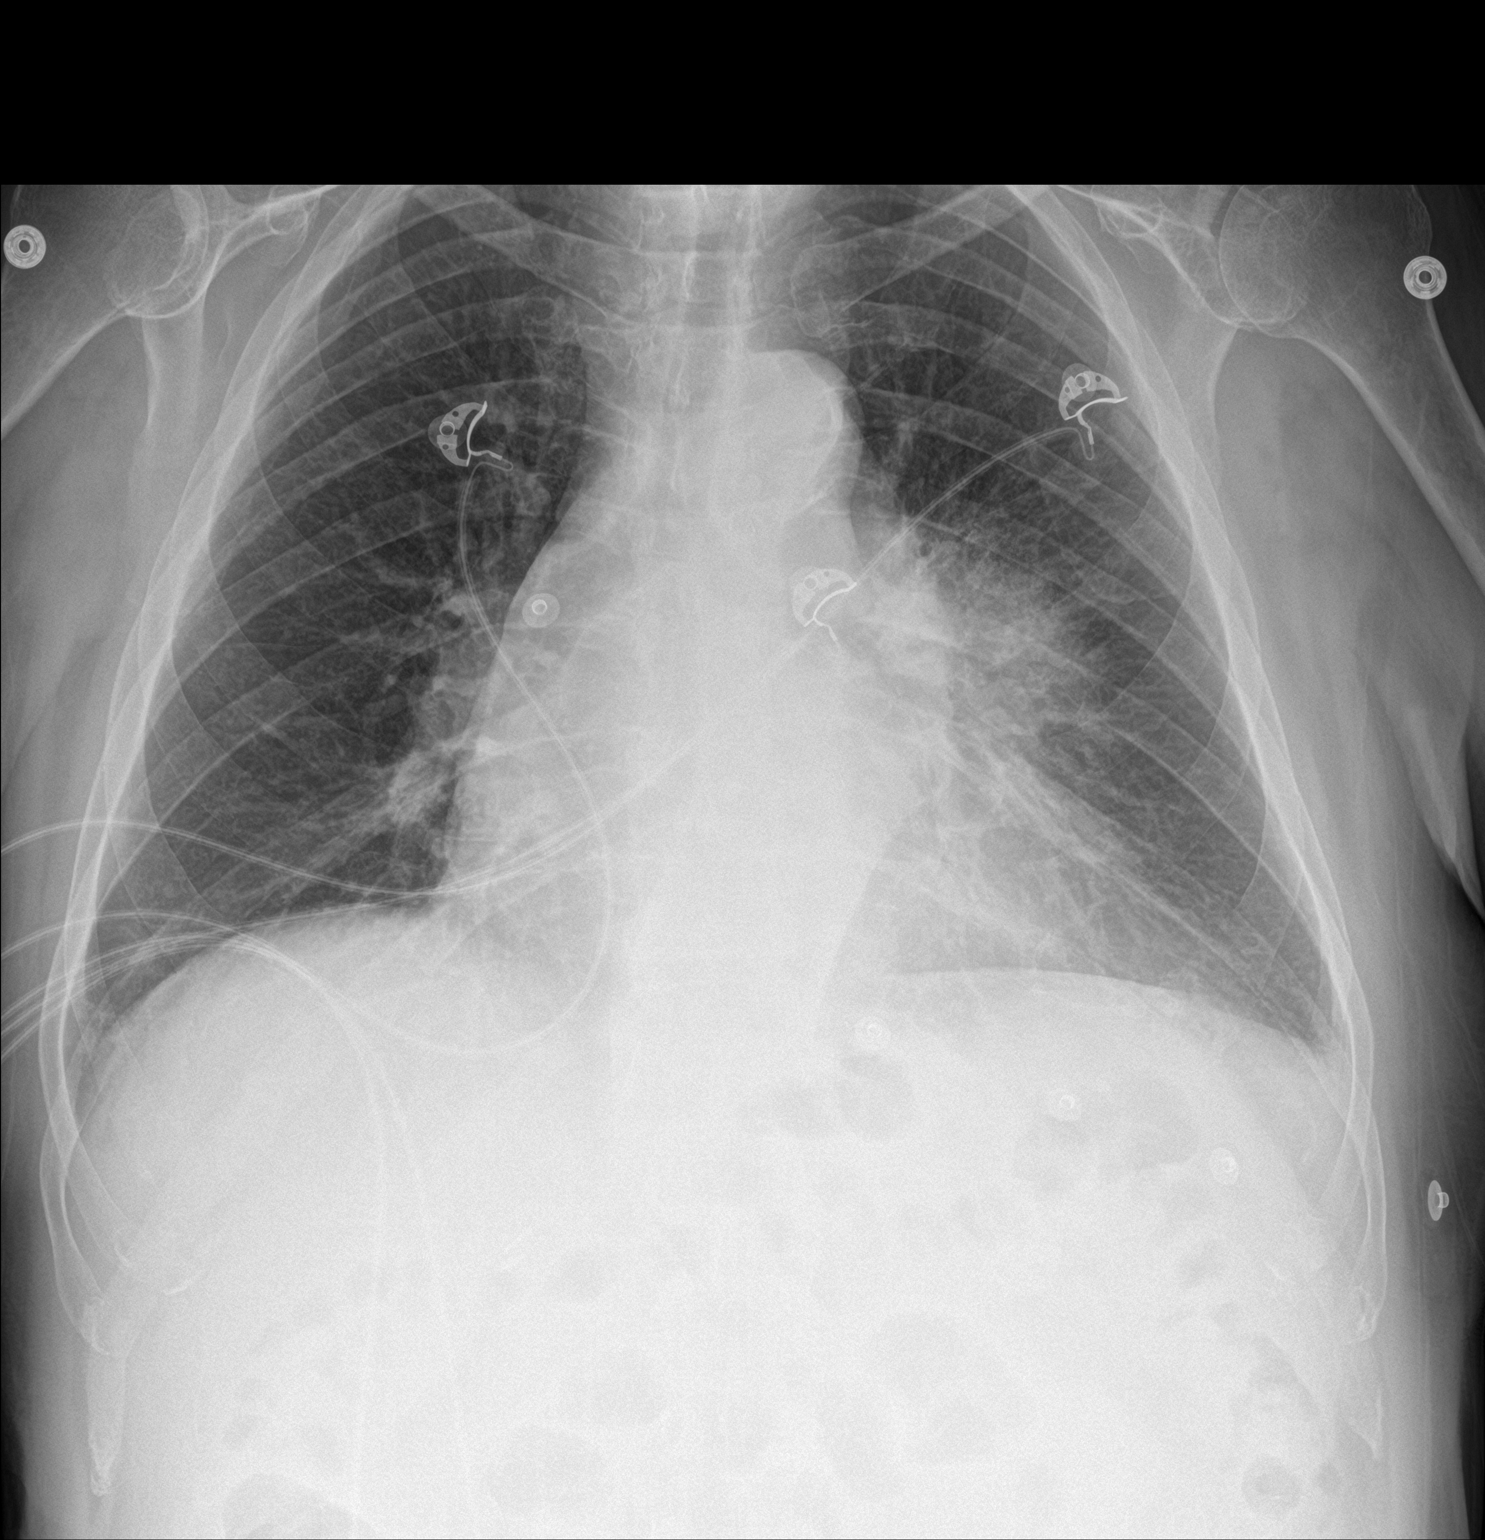

[chest lat]
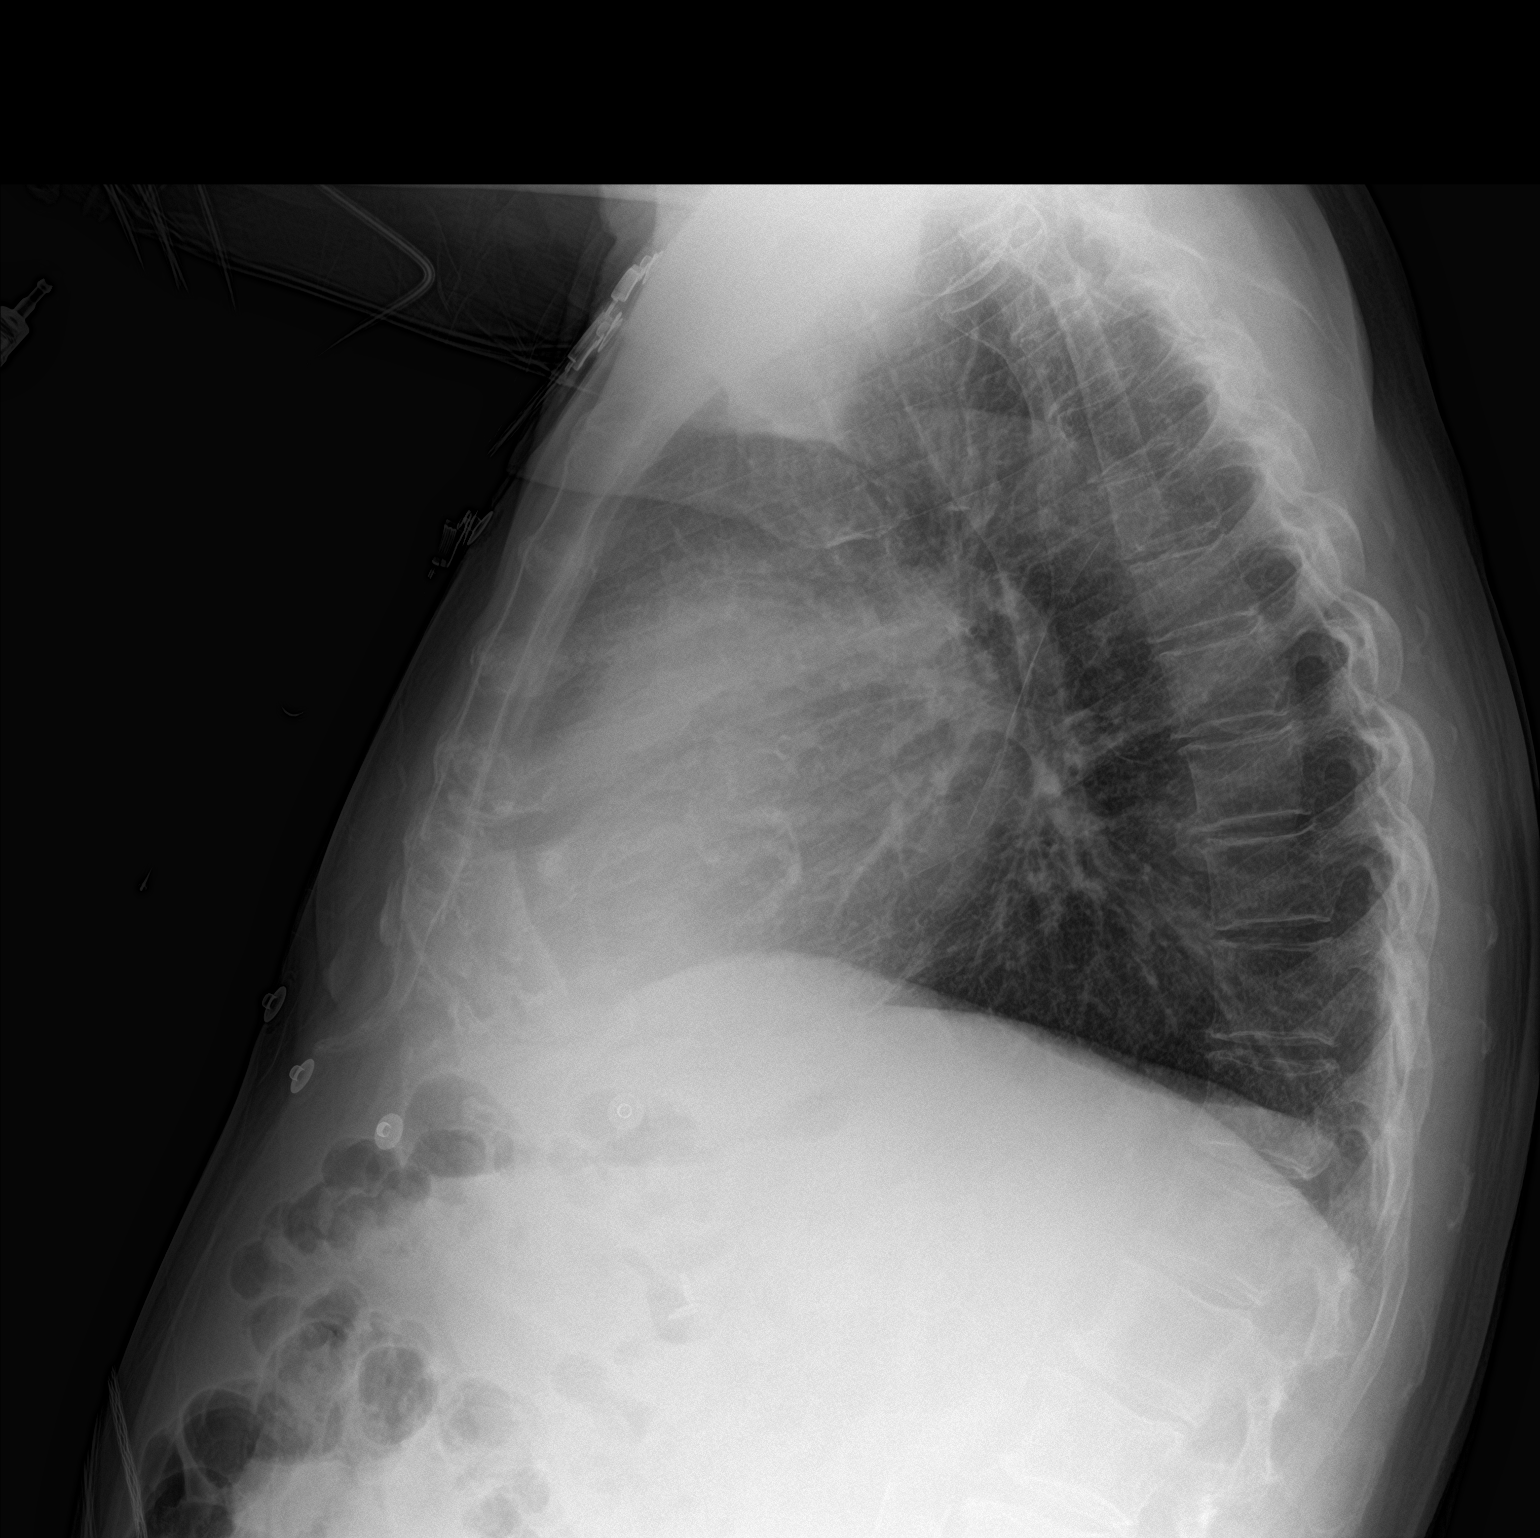

[2 of 2 positions shown; findings below may reference images not displayed]

FINDINGS: Transverse diameter of heart is increased. Infiltrate is seen in the
left parahilar region with no significant interval change.
Infiltrate appears to be in the anterior aspect of left mid lung
fields in the left upper lobe. There are no new focal infiltrates or
signs of alveolar pulmonary edema. There is blunting of left lateral
CP angle. There is no pneumothorax.
IMPRESSION: Infiltrate in the left parahilar region has not changed. This may
suggest pneumonia in the left upper lobe. Follow-up studies until
complete clearing occurs should be considered to rule out any
underlying neoplastic process. Small left pleural effusion is seen.

## 2022-08-16 IMAGING — DX DG CHEST 2V
2 series · 2 of 2 positions shown · non-contrast
Comparison: Chest two views 10/29/2021

CLINICAL DATA: Cardiac device in-situ.

EXAM:
CHEST - 2 VIEW

[w chest pa]
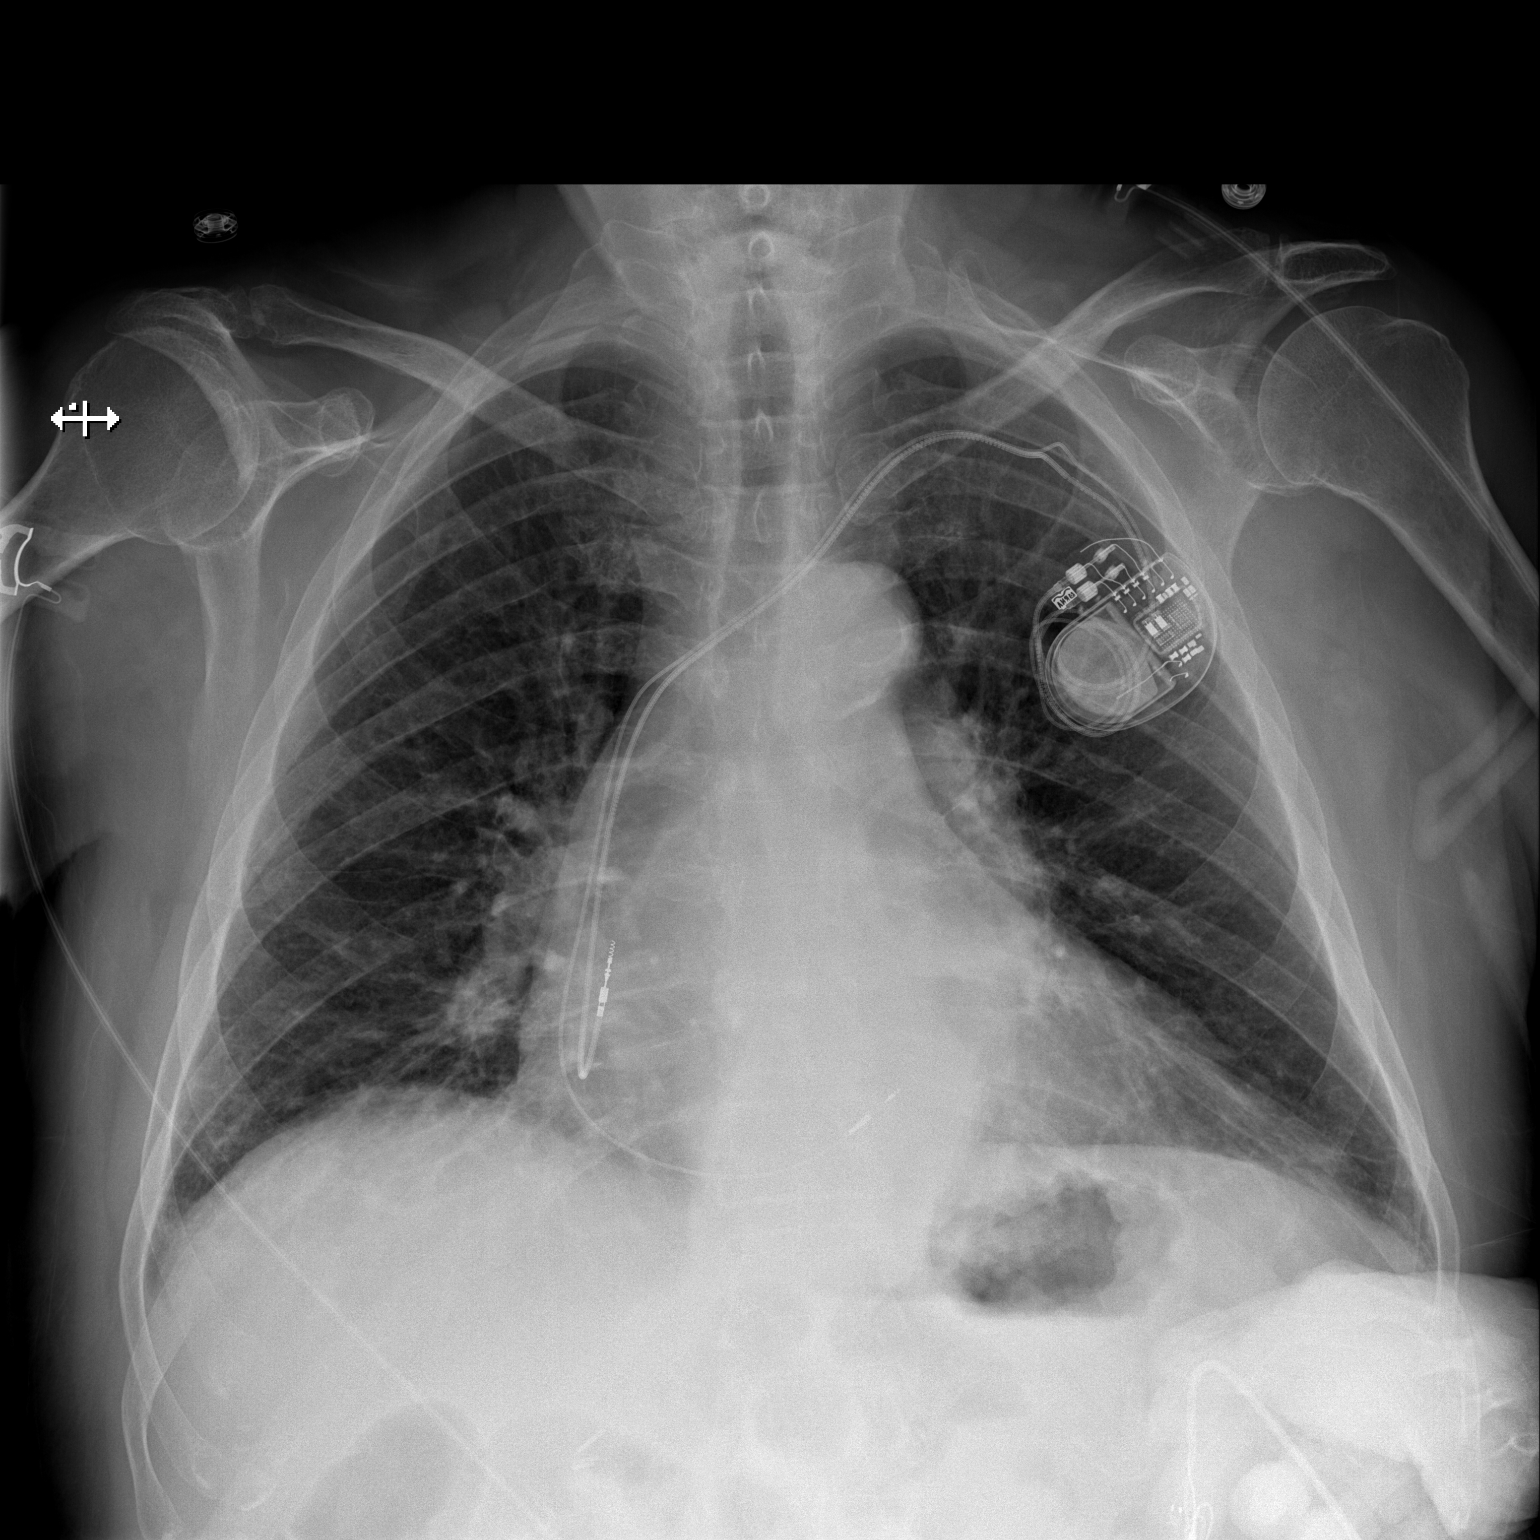

[w chest lat]
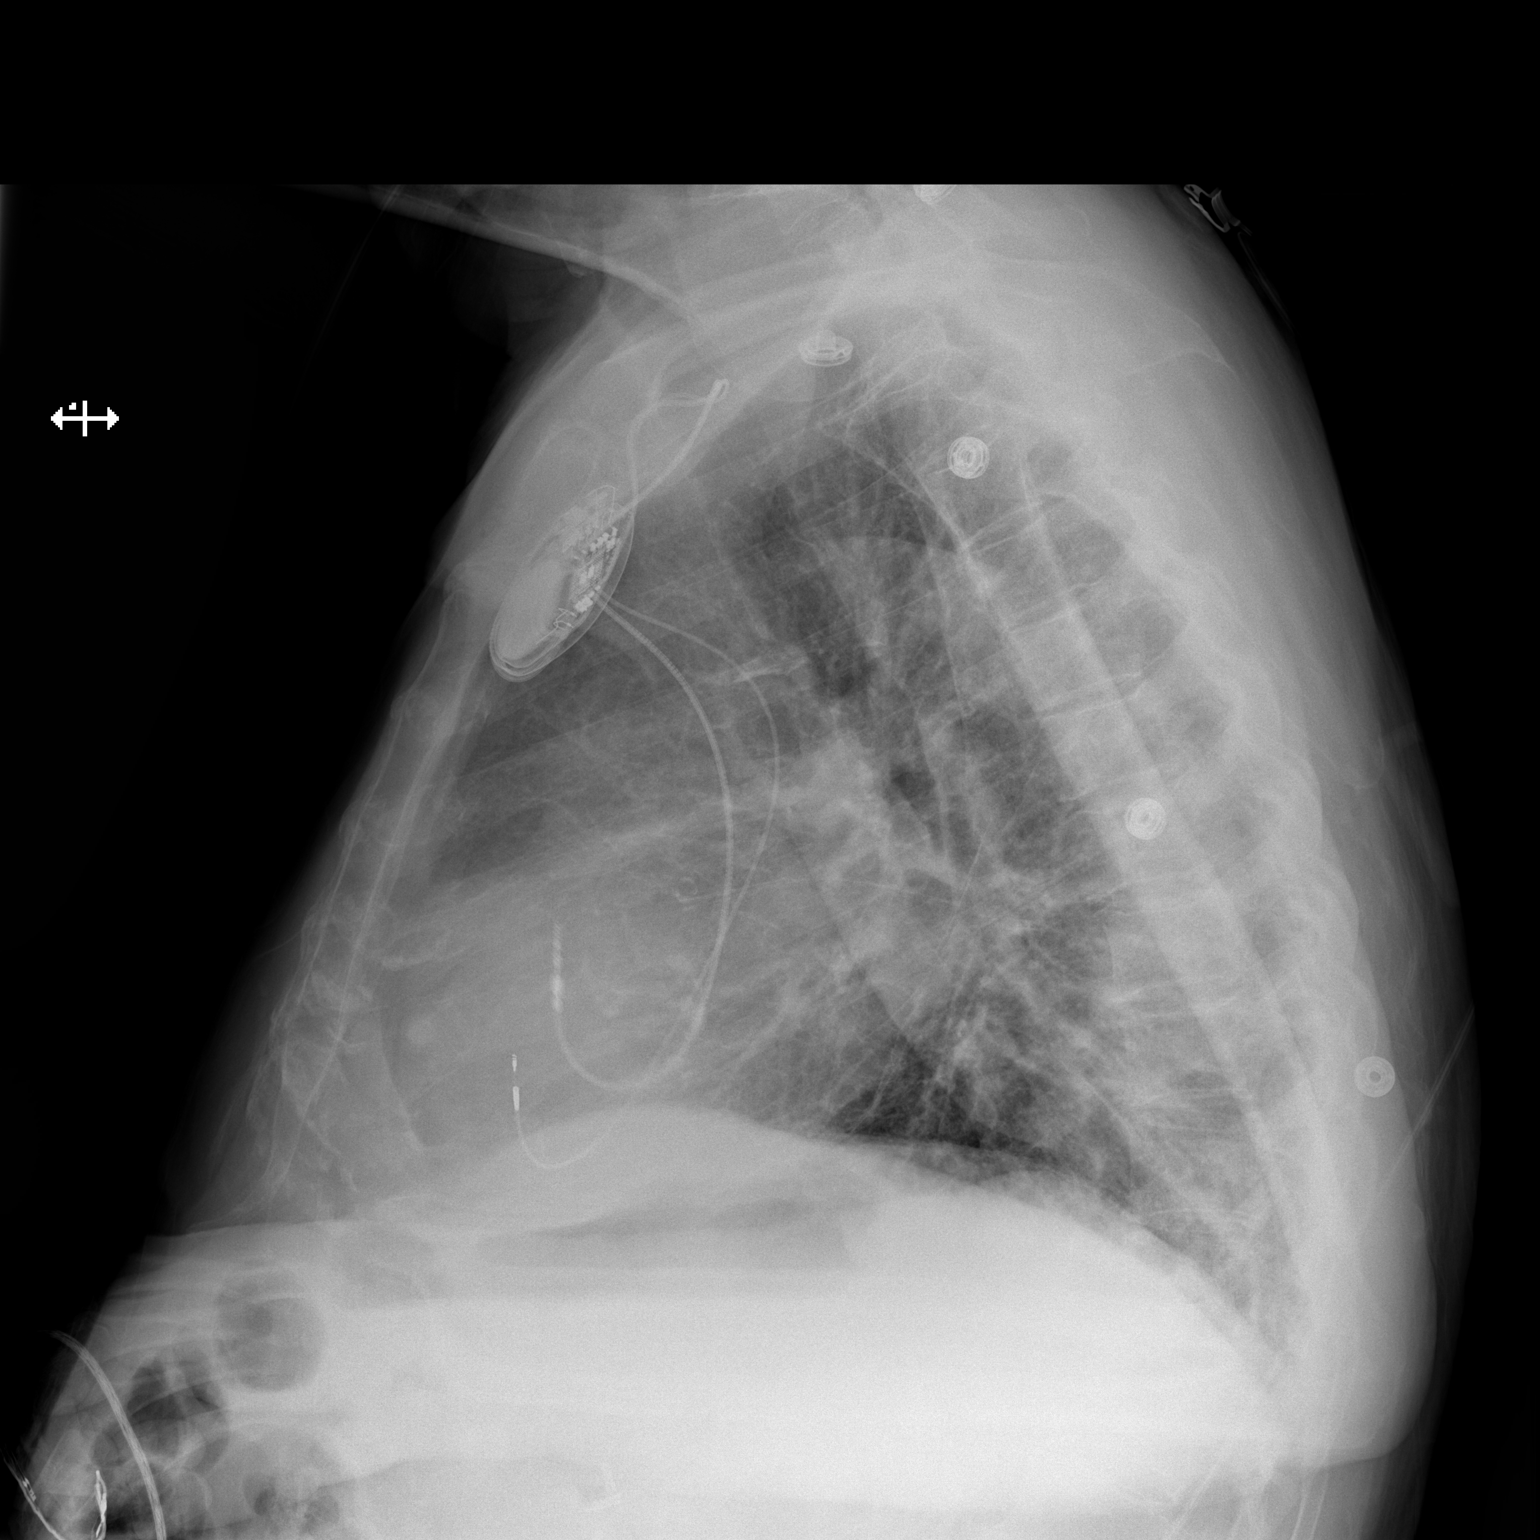

[2 of 2 positions shown; findings below may reference images not displayed]

FINDINGS: Cardiac silhouette is again mildly enlarged. Mild-to-moderate
calcification within aortic arch. New left chest wall cardiac pacer
with leads overlying the right atrium and right ventricle. Unchanged
mild bibasilar linear likely scarring. No pleural effusion or
pneumothorax. Mild multilevel degenerative disc changes of the
thoracic spine.
IMPRESSION: 1. New left chest wall cardiac pacer.  No pneumothorax.
2. No acute lung process.
